# Patient Record
Sex: Female | Born: 1965 | Race: White | Hispanic: No | Marital: Married | Smoking: Never smoker
Health system: Southern US, Community
[De-identification: ages and names within clinical notes are randomized; demographics above are authoritative.]

## PROBLEM LIST (undated history)

## (undated) DIAGNOSIS — R519 Headache, unspecified: Secondary | ICD-10-CM

## (undated) DIAGNOSIS — T7840XA Allergy, unspecified, initial encounter: Secondary | ICD-10-CM

## (undated) DIAGNOSIS — K635 Polyp of colon: Secondary | ICD-10-CM

## (undated) DIAGNOSIS — R112 Nausea with vomiting, unspecified: Secondary | ICD-10-CM

## (undated) DIAGNOSIS — K56609 Unspecified intestinal obstruction, unspecified as to partial versus complete obstruction: Secondary | ICD-10-CM

## (undated) DIAGNOSIS — Z87442 Personal history of urinary calculi: Secondary | ICD-10-CM

## (undated) DIAGNOSIS — E039 Hypothyroidism, unspecified: Secondary | ICD-10-CM

## (undated) DIAGNOSIS — I319 Disease of pericardium, unspecified: Secondary | ICD-10-CM

## (undated) DIAGNOSIS — I472 Ventricular tachycardia: Secondary | ICD-10-CM

## (undated) DIAGNOSIS — E559 Vitamin D deficiency, unspecified: Secondary | ICD-10-CM

## (undated) DIAGNOSIS — R7303 Prediabetes: Secondary | ICD-10-CM

## (undated) DIAGNOSIS — N200 Calculus of kidney: Secondary | ICD-10-CM

## (undated) DIAGNOSIS — Z9889 Other specified postprocedural states: Secondary | ICD-10-CM

## (undated) DIAGNOSIS — T4145XA Adverse effect of unspecified anesthetic, initial encounter: Secondary | ICD-10-CM

## (undated) DIAGNOSIS — E785 Hyperlipidemia, unspecified: Secondary | ICD-10-CM

## (undated) DIAGNOSIS — Z8669 Personal history of other diseases of the nervous system and sense organs: Secondary | ICD-10-CM

## (undated) DIAGNOSIS — R000 Tachycardia, unspecified: Secondary | ICD-10-CM

## (undated) DIAGNOSIS — K5790 Diverticulosis of intestine, part unspecified, without perforation or abscess without bleeding: Secondary | ICD-10-CM

## (undated) DIAGNOSIS — N329 Bladder disorder, unspecified: Secondary | ICD-10-CM

## (undated) DIAGNOSIS — F32A Depression, unspecified: Secondary | ICD-10-CM

## (undated) DIAGNOSIS — L719 Rosacea, unspecified: Secondary | ICD-10-CM

## (undated) DIAGNOSIS — I5030 Unspecified diastolic (congestive) heart failure: Secondary | ICD-10-CM

## (undated) DIAGNOSIS — F419 Anxiety disorder, unspecified: Secondary | ICD-10-CM

## (undated) DIAGNOSIS — K529 Noninfective gastroenteritis and colitis, unspecified: Secondary | ICD-10-CM

## (undated) DIAGNOSIS — J189 Pneumonia, unspecified organism: Secondary | ICD-10-CM

## (undated) DIAGNOSIS — T8859XA Other complications of anesthesia, initial encounter: Secondary | ICD-10-CM

## (undated) DIAGNOSIS — Z8659 Personal history of other mental and behavioral disorders: Secondary | ICD-10-CM

## (undated) HISTORY — PX: SHOULDER SURGERY: SHX246

## (undated) HISTORY — PX: COLONOSCOPY: SHX174

## (undated) HISTORY — DX: Polyp of colon: K63.5

## (undated) HISTORY — PX: LAPAROSCOPY: SHX197

## (undated) HISTORY — PX: ACHILLES TENDON SURGERY: SHX542

## (undated) HISTORY — DX: Personal history of other diseases of the nervous system and sense organs: Z86.69

## (undated) HISTORY — PX: UPPER GASTROINTESTINAL ENDOSCOPY: SHX188

## (undated) HISTORY — DX: Vitamin D deficiency, unspecified: E55.9

## (undated) HISTORY — PX: APPENDECTOMY: SHX54

## (undated) HISTORY — DX: Allergy, unspecified, initial encounter: T78.40XA

## (undated) HISTORY — DX: Anxiety disorder, unspecified: F41.9

## (undated) HISTORY — DX: Tachycardia, unspecified: R00.0

## (undated) HISTORY — PX: TOTAL ABDOMINAL HYSTERECTOMY W/ BILATERAL SALPINGOOPHORECTOMY: SHX83

## (undated) HISTORY — DX: Noninfective gastroenteritis and colitis, unspecified: K52.9

## (undated) HISTORY — PX: CHOLECYSTECTOMY: SHX55

## (undated) HISTORY — DX: Personal history of other mental and behavioral disorders: Z86.59

## (undated) HISTORY — DX: Diverticulosis of intestine, part unspecified, without perforation or abscess without bleeding: K57.90

## (undated) HISTORY — DX: Hypothyroidism, unspecified: E03.9

## (undated) HISTORY — DX: Hyperlipidemia, unspecified: E78.5

## (undated) HISTORY — DX: Unspecified diastolic (congestive) heart failure: I50.30

## (undated) HISTORY — DX: Rosacea, unspecified: L71.9

---

## 1998-08-15 ENCOUNTER — Other Ambulatory Visit: Admission: RE | Admit: 1998-08-15 | Discharge: 1998-08-15 | Payer: Self-pay | Admitting: Obstetrics and Gynecology

## 1999-07-02 ENCOUNTER — Encounter: Payer: Self-pay | Admitting: *Deleted

## 1999-07-02 ENCOUNTER — Encounter: Admission: RE | Admit: 1999-07-02 | Discharge: 1999-07-02 | Payer: Self-pay | Admitting: *Deleted

## 1999-08-27 ENCOUNTER — Other Ambulatory Visit: Admission: RE | Admit: 1999-08-27 | Discharge: 1999-08-27 | Payer: Self-pay | Admitting: Obstetrics and Gynecology

## 2000-11-13 ENCOUNTER — Other Ambulatory Visit: Admission: RE | Admit: 2000-11-13 | Discharge: 2000-11-13 | Payer: Self-pay | Admitting: Obstetrics and Gynecology

## 2001-11-17 ENCOUNTER — Emergency Department (HOSPITAL_COMMUNITY): Admission: EM | Admit: 2001-11-17 | Discharge: 2001-11-17 | Payer: Self-pay | Admitting: Emergency Medicine

## 2001-11-18 ENCOUNTER — Encounter: Admission: RE | Admit: 2001-11-18 | Discharge: 2001-11-18 | Payer: Self-pay

## 2001-11-18 ENCOUNTER — Inpatient Hospital Stay (HOSPITAL_COMMUNITY): Admission: AD | Admit: 2001-11-18 | Discharge: 2001-11-21 | Payer: Self-pay | Admitting: Gastroenterology

## 2001-12-07 ENCOUNTER — Other Ambulatory Visit: Admission: RE | Admit: 2001-12-07 | Discharge: 2001-12-07 | Payer: Self-pay | Admitting: Obstetrics & Gynecology

## 2001-12-28 ENCOUNTER — Encounter: Payer: Self-pay | Admitting: Gastroenterology

## 2001-12-28 ENCOUNTER — Encounter: Admission: RE | Admit: 2001-12-28 | Discharge: 2001-12-28 | Payer: Self-pay | Admitting: Gastroenterology

## 2002-04-22 ENCOUNTER — Encounter (INDEPENDENT_AMBULATORY_CARE_PROVIDER_SITE_OTHER): Payer: Self-pay

## 2002-04-22 ENCOUNTER — Ambulatory Visit (HOSPITAL_COMMUNITY): Admission: RE | Admit: 2002-04-22 | Discharge: 2002-04-22 | Payer: Self-pay | Admitting: Obstetrics and Gynecology

## 2002-04-29 ENCOUNTER — Encounter: Payer: Self-pay | Admitting: Obstetrics and Gynecology

## 2002-04-29 ENCOUNTER — Ambulatory Visit (HOSPITAL_COMMUNITY): Admission: RE | Admit: 2002-04-29 | Discharge: 2002-04-29 | Payer: Self-pay | Admitting: Obstetrics and Gynecology

## 2002-12-13 ENCOUNTER — Other Ambulatory Visit: Admission: RE | Admit: 2002-12-13 | Discharge: 2002-12-13 | Payer: Self-pay | Admitting: Obstetrics and Gynecology

## 2003-01-31 ENCOUNTER — Inpatient Hospital Stay (HOSPITAL_COMMUNITY): Admission: RE | Admit: 2003-01-31 | Discharge: 2003-02-02 | Payer: Self-pay | Admitting: Obstetrics and Gynecology

## 2003-01-31 ENCOUNTER — Encounter (INDEPENDENT_AMBULATORY_CARE_PROVIDER_SITE_OTHER): Payer: Self-pay | Admitting: Specialist

## 2003-02-04 HISTORY — PX: TOTAL ABDOMINAL HYSTERECTOMY W/ BILATERAL SALPINGOOPHORECTOMY: SHX83

## 2003-03-17 ENCOUNTER — Encounter: Admission: RE | Admit: 2003-03-17 | Discharge: 2003-03-17 | Payer: Self-pay | Admitting: Obstetrics and Gynecology

## 2003-06-22 ENCOUNTER — Encounter: Admission: RE | Admit: 2003-06-22 | Discharge: 2003-06-22 | Payer: Self-pay | Admitting: Family Medicine

## 2003-08-11 ENCOUNTER — Observation Stay (HOSPITAL_COMMUNITY): Admission: EM | Admit: 2003-08-11 | Discharge: 2003-08-12 | Payer: Self-pay | Admitting: Emergency Medicine

## 2003-08-16 ENCOUNTER — Inpatient Hospital Stay (HOSPITAL_COMMUNITY): Admission: AD | Admit: 2003-08-16 | Discharge: 2003-08-22 | Payer: Self-pay | Admitting: Internal Medicine

## 2003-10-25 ENCOUNTER — Ambulatory Visit (HOSPITAL_COMMUNITY): Admission: RE | Admit: 2003-10-25 | Discharge: 2003-10-25 | Payer: Self-pay | Admitting: Internal Medicine

## 2003-10-31 ENCOUNTER — Observation Stay (HOSPITAL_COMMUNITY): Admission: AD | Admit: 2003-10-31 | Discharge: 2003-11-01 | Payer: Self-pay | Admitting: General Surgery

## 2003-10-31 ENCOUNTER — Encounter (INDEPENDENT_AMBULATORY_CARE_PROVIDER_SITE_OTHER): Payer: Self-pay | Admitting: Specialist

## 2003-10-31 ENCOUNTER — Encounter: Payer: Self-pay | Admitting: Internal Medicine

## 2004-01-19 ENCOUNTER — Emergency Department (HOSPITAL_COMMUNITY): Admission: EM | Admit: 2004-01-19 | Discharge: 2004-01-20 | Payer: Self-pay | Admitting: Emergency Medicine

## 2004-02-02 ENCOUNTER — Other Ambulatory Visit: Admission: RE | Admit: 2004-02-02 | Discharge: 2004-02-02 | Payer: Self-pay | Admitting: Obstetrics and Gynecology

## 2004-02-04 DIAGNOSIS — K529 Noninfective gastroenteritis and colitis, unspecified: Secondary | ICD-10-CM

## 2004-02-04 HISTORY — DX: Noninfective gastroenteritis and colitis, unspecified: K52.9

## 2004-02-05 ENCOUNTER — Inpatient Hospital Stay (HOSPITAL_COMMUNITY): Admission: RE | Admit: 2004-02-05 | Discharge: 2004-02-07 | Payer: Self-pay | Admitting: Psychiatry

## 2004-02-05 ENCOUNTER — Emergency Department (HOSPITAL_COMMUNITY): Admission: EM | Admit: 2004-02-05 | Discharge: 2004-02-05 | Payer: Self-pay | Admitting: Emergency Medicine

## 2004-02-05 ENCOUNTER — Ambulatory Visit: Payer: Self-pay | Admitting: Psychiatry

## 2004-06-02 ENCOUNTER — Emergency Department (HOSPITAL_COMMUNITY): Admission: EM | Admit: 2004-06-02 | Discharge: 2004-06-02 | Payer: Self-pay | Admitting: Emergency Medicine

## 2004-10-03 ENCOUNTER — Encounter: Admission: RE | Admit: 2004-10-03 | Discharge: 2004-10-03 | Payer: Self-pay | Admitting: Obstetrics and Gynecology

## 2004-10-24 ENCOUNTER — Encounter: Admission: RE | Admit: 2004-10-24 | Discharge: 2004-10-24 | Payer: Self-pay | Admitting: Family Medicine

## 2005-01-29 ENCOUNTER — Encounter: Admission: RE | Admit: 2005-01-29 | Discharge: 2005-01-29 | Payer: Self-pay | Admitting: Family Medicine

## 2005-10-15 ENCOUNTER — Encounter: Admission: RE | Admit: 2005-10-15 | Discharge: 2005-10-15 | Payer: Self-pay | Admitting: Internal Medicine

## 2006-02-24 ENCOUNTER — Encounter: Admission: RE | Admit: 2006-02-24 | Discharge: 2006-02-24 | Payer: Self-pay | Admitting: Surgery

## 2007-04-01 ENCOUNTER — Encounter: Admission: RE | Admit: 2007-04-01 | Discharge: 2007-04-01 | Payer: Self-pay | Admitting: Internal Medicine

## 2007-04-18 ENCOUNTER — Encounter: Admission: RE | Admit: 2007-04-18 | Discharge: 2007-04-18 | Payer: Self-pay | Admitting: Internal Medicine

## 2007-07-01 ENCOUNTER — Ambulatory Visit: Payer: Self-pay | Admitting: Nurse Practitioner

## 2008-02-04 HISTORY — PX: SHOULDER SURGERY: SHX246

## 2008-04-03 ENCOUNTER — Encounter: Admission: RE | Admit: 2008-04-03 | Discharge: 2008-04-03 | Payer: Self-pay | Admitting: Internal Medicine

## 2008-04-17 ENCOUNTER — Encounter: Admission: RE | Admit: 2008-04-17 | Discharge: 2008-04-17 | Payer: Self-pay | Admitting: Internal Medicine

## 2008-04-24 ENCOUNTER — Encounter: Admission: RE | Admit: 2008-04-24 | Discharge: 2008-04-24 | Payer: Self-pay | Admitting: Internal Medicine

## 2008-10-24 ENCOUNTER — Ambulatory Visit: Payer: Self-pay | Admitting: Diagnostic Radiology

## 2008-10-24 ENCOUNTER — Encounter: Payer: Self-pay | Admitting: Emergency Medicine

## 2008-10-25 ENCOUNTER — Inpatient Hospital Stay (HOSPITAL_COMMUNITY): Admission: EM | Admit: 2008-10-25 | Discharge: 2008-10-26 | Payer: Self-pay | Admitting: Internal Medicine

## 2008-11-15 ENCOUNTER — Inpatient Hospital Stay (HOSPITAL_BASED_OUTPATIENT_CLINIC_OR_DEPARTMENT_OTHER): Admission: RE | Admit: 2008-11-15 | Discharge: 2008-11-15 | Payer: Self-pay | Admitting: Cardiovascular Disease

## 2008-11-15 HISTORY — PX: CARDIAC CATHETERIZATION: SHX172

## 2009-01-02 ENCOUNTER — Ambulatory Visit: Payer: Self-pay | Admitting: Internal Medicine

## 2009-01-02 ENCOUNTER — Encounter (INDEPENDENT_AMBULATORY_CARE_PROVIDER_SITE_OTHER): Payer: Self-pay | Admitting: *Deleted

## 2009-01-02 ENCOUNTER — Ambulatory Visit (HOSPITAL_COMMUNITY): Admission: RE | Admit: 2009-01-02 | Discharge: 2009-01-02 | Payer: Self-pay | Admitting: Cardiovascular Disease

## 2009-02-03 HISTORY — PX: POLYPECTOMY: SHX149

## 2009-04-13 ENCOUNTER — Encounter: Admission: RE | Admit: 2009-04-13 | Discharge: 2009-04-13 | Payer: Self-pay | Admitting: Internal Medicine

## 2009-05-01 LAB — TSH: TSH: 0.2 u[IU]/mL — AB (ref <=5.90)

## 2010-02-24 ENCOUNTER — Encounter: Payer: Self-pay | Admitting: Gastroenterology

## 2010-02-24 ENCOUNTER — Encounter: Payer: Self-pay | Admitting: Internal Medicine

## 2010-02-24 ENCOUNTER — Encounter: Payer: Self-pay | Admitting: Surgery

## 2010-03-19 ENCOUNTER — Other Ambulatory Visit: Payer: Self-pay | Admitting: Internal Medicine

## 2010-03-19 DIAGNOSIS — Z1231 Encounter for screening mammogram for malignant neoplasm of breast: Secondary | ICD-10-CM

## 2010-04-10 ENCOUNTER — Encounter: Payer: Self-pay | Admitting: Cardiovascular Disease

## 2010-04-10 DIAGNOSIS — I5030 Unspecified diastolic (congestive) heart failure: Secondary | ICD-10-CM | POA: Insufficient documentation

## 2010-04-10 DIAGNOSIS — E785 Hyperlipidemia, unspecified: Secondary | ICD-10-CM | POA: Insufficient documentation

## 2010-04-10 DIAGNOSIS — Z8659 Personal history of other mental and behavioral disorders: Secondary | ICD-10-CM | POA: Insufficient documentation

## 2010-04-10 DIAGNOSIS — F419 Anxiety disorder, unspecified: Secondary | ICD-10-CM | POA: Insufficient documentation

## 2010-04-10 DIAGNOSIS — L719 Rosacea, unspecified: Secondary | ICD-10-CM | POA: Insufficient documentation

## 2010-04-10 DIAGNOSIS — E039 Hypothyroidism, unspecified: Secondary | ICD-10-CM | POA: Insufficient documentation

## 2010-04-10 DIAGNOSIS — Z8669 Personal history of other diseases of the nervous system and sense organs: Secondary | ICD-10-CM | POA: Insufficient documentation

## 2010-04-26 ENCOUNTER — Ambulatory Visit: Payer: Self-pay

## 2010-05-03 ENCOUNTER — Encounter: Payer: Self-pay | Admitting: Internal Medicine

## 2010-05-03 ENCOUNTER — Encounter: Payer: Self-pay | Admitting: Cardiovascular Disease

## 2010-05-03 ENCOUNTER — Ambulatory Visit (INDEPENDENT_AMBULATORY_CARE_PROVIDER_SITE_OTHER): Payer: Commercial Managed Care - PPO | Admitting: Cardiovascular Disease

## 2010-05-03 ENCOUNTER — Ambulatory Visit
Admission: RE | Admit: 2010-05-03 | Discharge: 2010-05-03 | Disposition: A | Discharge status: Home / Self Care | Payer: Commercial Managed Care - PPO | Source: Ambulatory Visit | Attending: Internal Medicine | Admitting: Internal Medicine

## 2010-05-03 VITALS — BP 120/80 | HR 88 | Wt 234.0 lb

## 2010-05-03 DIAGNOSIS — Z1231 Encounter for screening mammogram for malignant neoplasm of breast: Secondary | ICD-10-CM

## 2010-05-03 DIAGNOSIS — E669 Obesity, unspecified: Secondary | ICD-10-CM

## 2010-05-03 DIAGNOSIS — I10 Essential (primary) hypertension: Secondary | ICD-10-CM

## 2010-05-03 DIAGNOSIS — I309 Acute pericarditis, unspecified: Secondary | ICD-10-CM

## 2010-05-03 MED ORDER — DILTIAZEM HCL ER COATED BEADS 120 MG PO CP24: 120.0000 mg | ORAL_CAPSULE | Freq: Every day | ORAL | Status: DC

## 2010-05-03 NOTE — Progress Notes (Signed)
History of Present Illness:  Cynthia [name] is a middle-aged female with the history of pericardial like pleuritic chest pain, diastolic congestive heart failure, hyperlipidemia, obesity, and hypertension. She also has a history of hypothyroidism is on Synthroid replacement. She has had significant pleuritic chest pain for the past several weeks. She's been on Indocin and it seems to be helping. She injured her left shoulder and he stepped surgery in the next week or so.  In the past we have performed a heart catheterization on her which revealed smooth and normal coronary arteries. She has had a cardiopulmonary stress test which revealed no significant cardiac abnormalities. Current Outpatient Prescriptions on File Prior to Visit  Medication Sig Dispense Refill  . atorvastatin (LIPITOR) 20 MG tablet Take 20 mg by mouth daily.        . Est Estrogens-Methyltest (ESTRATEST PO) Take by mouth daily.        . LamoTRIgine (LAMICTAL XR) 300 MG TB24 Take by mouth daily.        Marland Kitchen levothyroxine (SYNTHROID, LEVOTHROID) 75 MCG tablet Take 75 mcg by mouth daily.        . metoprolol (LOPRESSOR) 100 MG tablet Take 150 mg by mouth 2 (two) times daily.        . QUEtiapine (SEROQUEL) 25 MG tablet Take 25 mg by mouth at bedtime.        Marland Kitchen DISCONTD: niacin (NIASPAN) 500 MG CR tablet Take 500 mg by mouth at bedtime.        Marland Kitchen DISCONTD: Sulfamethoxazole-Trimethoprim (SEPTRA DS PO) Take by mouth daily.          Allergies  Allergen Reactions  . Ampicillin     SERUM SICKNESS REACTION    Past Medical History  Diagnosis Date  . Diastolic congestive heart failure   . Hyperlipemia   . Hypothyroidism   . History of depression   . History of migraines   . Rosacea   . Anxiety     Past Surgical History  Procedure Date  . Total abdominal hysterectomy w/ bilateral salpingoophorectomy   . Cholecystectomy   . Cardiac catheterization 11/15/08    SMOOTH AND NORMAL    History  Smoking status  . Never Smoker   Smokeless  tobacco  . Not on file    History  Alcohol Use No    Family History  Problem Relation Age of Onset  . Adopted: Yes    Reviw of Systems:  The patient denies any heat or cold intolerance.  No weight gain or weight loss.  The patient denies headaches or blurry vision.  There is no cough or sputum production.  The patient denies dizziness.  There is no hematuria or hematochezia.  The patient denies any muscle aches or arthritis.  The patient denies any rash.  The patient denies frequent falling or instability.  There is no history of depression or anxiety.  All other systems were reviewed and are negative.  Physical Exam: BP 120/80  Pulse 88  Wt 234 lb (106.142 kg) The patient is alert and oriented x 3.  The mood and affect are normal.  The skin is warm and dry.  Color is normal.  The HEENT exam reveals that the sclera are nonicteric.  The mucous membranes are moist.  The carotids are 2+ without bruits.  There is no thyromegaly.  There is no JVD.  The lungs are clear.  The chest wall is non tender.  The heart exam reveals a regular rate with a normal S1  and S2.  There are no murmurs, gallops, or rubs.  The PMI is not displaced.   Abdominal exam reveals good bowel sounds.  There is no guarding or rebound.  There is no hepatosplenomegaly or tenderness.  There are no masses.  Exam of the legs reveal no clubbing, cyanosis, or edema.  The legs are without rashes.  The distal pulses are intact.  Cranial nerves II - XII are intact.  Motor and sensory functions are intact.  The gait is normal.  ECG: From her office reveals normal sinus rhythm. She has no ST segment depression and no ST segment elevation. Assessment / Plan:

## 2010-05-03 NOTE — Assessment & Plan Note (Signed)
Cynthia [name]  presents with persistent episodes of Pleuritic chest pain. She had another CT angiogram of her chest and has no evidence of pulmonary embolus. I do not have a good explanation of why she keeps having pleuritic chest pain. Her echocardiograms have not revealed any evidence of pericardial thickening and she does not have any other hemodynamic signs or symptoms consistent with a chronic pericarditis.She is to continue with the Indocin. I'll see her back in 6 months.

## 2010-05-03 NOTE — Assessment & Plan Note (Signed)
I've asked her to stick to a good diet and exercise program. I've asked her to try eating a lower carbohydrate diet and include more protein. She may need to see a nutritionist. Also asked her to exercise on a regular basis.

## 2010-05-09 LAB — POCT I-STAT 3, VENOUS BLOOD GAS (G3P V)
Acid-base deficit: 1 mmol/L (ref 0.0–2.0)
Bicarbonate: 24.7 mEq/L — ABNORMAL HIGH (ref 20.0–24.0)
O2 Saturation: 74 %
pO2, Ven: 42 mmHg (ref 30.0–45.0)

## 2010-05-09 LAB — POCT I-STAT 3, ART BLOOD GAS (G3+)
Bicarbonate: 23.8 mEq/L (ref 20.0–24.0)
pCO2 arterial: 40 mmHg (ref 35.0–45.0)
pH, Arterial: 7.383 (ref 7.350–7.400)
pO2, Arterial: 77 mmHg — ABNORMAL LOW (ref 80.0–100.0)

## 2010-05-10 LAB — DIFFERENTIAL
Basophils Absolute: 0.1 10*3/uL (ref 0.0–0.1)
Lymphocytes Relative: 22 % (ref 12–46)
Monocytes Absolute: 0.4 10*3/uL (ref 0.1–1.0)
Monocytes Relative: 5 % (ref 3–12)
Neutro Abs: 5 10*3/uL (ref 1.7–7.7)

## 2010-05-10 LAB — BASIC METABOLIC PANEL
Calcium: 9.6 mg/dL (ref 8.4–10.5)
GFR calc Af Amer: >60 mL/min (ref >60)
GFR calc non Af Amer: >60 mL/min (ref >60)
Sodium: 143 mEq/L (ref 135–145)

## 2010-05-10 LAB — POCT CARDIAC MARKERS
CKMB, poc: <1 ng/mL — ABNORMAL LOW (ref 1.0–8.0)
Myoglobin, poc: 41.2 ng/mL (ref 12–200)
Troponin i, poc: <0.05 ng/mL (ref 0.00–0.09)

## 2010-05-10 LAB — COMPREHENSIVE METABOLIC PANEL
ALT: 29 U/L (ref 0–35)
Alkaline Phosphatase: 92 U/L (ref 39–117)
CO2: 26 mEq/L (ref 19–32)
Calcium: 8.8 mg/dL (ref 8.4–10.5)
GFR calc non Af Amer: >60 mL/min (ref >60)
Glucose, Bld: 109 mg/dL — ABNORMAL HIGH (ref 70–99)
Sodium: 138 mEq/L (ref 135–145)
Total Bilirubin: 0.1 mg/dL — ABNORMAL LOW (ref 0.3–1.2)

## 2010-05-10 LAB — CARDIAC PANEL(CRET KIN+CKTOT+MB+TROPI)
CK, MB: 0.6 ng/mL (ref 0.3–4.0)
Total CK: 79 U/L (ref 7–177)
Total CK: 84 U/L (ref 7–177)
Total CK: 86 U/L (ref 7–177)

## 2010-05-10 LAB — CBC
Hemoglobin: 12.5 g/dL (ref 12.0–15.0)
Hemoglobin: 13.5 g/dL (ref 12.0–15.0)
MCHC: 34.5 g/dL (ref 30.0–36.0)
RBC: 3.86 MIL/uL — ABNORMAL LOW (ref 3.87–5.11)
RBC: 4.13 MIL/uL (ref 3.87–5.11)
WBC: 7.2 10*3/uL (ref 4.0–10.5)

## 2010-05-10 LAB — SEDIMENTATION RATE: Sed Rate: 15 mm/hr (ref 0–22)

## 2010-05-10 LAB — TSH: TSH: 2.96 u[IU]/mL (ref 0.350–4.500)

## 2010-05-27 ENCOUNTER — Other Ambulatory Visit: Payer: Self-pay | Admitting: *Deleted

## 2010-05-27 ENCOUNTER — Telehealth: Payer: Self-pay | Admitting: *Deleted

## 2010-05-27 DIAGNOSIS — I10 Essential (primary) hypertension: Secondary | ICD-10-CM

## 2010-05-27 DIAGNOSIS — E785 Hyperlipidemia, unspecified: Secondary | ICD-10-CM

## 2010-05-27 MED ORDER — METOPROLOL TARTRATE 100 MG PO TABS: 150.0000 mg | ORAL_TABLET | Freq: Two times a day (BID) | ORAL | Status: DC

## 2010-05-27 MED ORDER — ATORVASTATIN CALCIUM 40 MG PO TABS: 40.0000 mg | ORAL_TABLET | Freq: Every day | ORAL | Status: DC

## 2010-05-27 NOTE — Telephone Encounter (Signed)
Fax received from pharmacy. Refill completed. Jodette Kerstie Agent RN  

## 2010-05-27 NOTE — Telephone Encounter (Signed)
Pt phoned, new med strength called in and pt told to increase exercise  And follow up with lipids a cmet in 3 months. Pt stated she can get it done for free where she works and would do. Alfonso Ramus RN

## 2010-05-31 ENCOUNTER — Other Ambulatory Visit: Payer: Self-pay | Admitting: *Deleted

## 2010-05-31 DIAGNOSIS — I10 Essential (primary) hypertension: Secondary | ICD-10-CM

## 2010-05-31 MED ORDER — METOPROLOL TARTRATE 100 MG PO TABS: 150.0000 mg | ORAL_TABLET | Freq: Two times a day (BID) | ORAL | Status: DC

## 2010-05-31 NOTE — Telephone Encounter (Signed)
Wants copy of beta tape echo from 2010, i called [facility] and they dont have i am turning over to our med records to see if they can research.Alfonso Ramus RN

## 2010-05-31 NOTE — Telephone Encounter (Signed)
escribe medication per fax request  

## 2010-06-03 ENCOUNTER — Encounter: Payer: Self-pay | Admitting: Cardiovascular Disease

## 2010-06-10 ENCOUNTER — Telehealth: Payer: Self-pay | Admitting: Cardiovascular Disease

## 2010-06-10 NOTE — Telephone Encounter (Signed)
Fax: [phone] latest OV, EKG, and ECHO

## 2010-06-18 NOTE — Assessment & Plan Note (Signed)
NAMESEVERINA, Cynthia [name]                  ACCOUNT NO.:  [id number]   MEDICAL RECORD NO.:  [id number]          PATIENT TYPE:  POB   LOCATION:  [facility] at [facility]         FACILITY:  [facility]   PHYSICIAN:  Elsie Lincoln, MD      DATE OF BIRTH:  [birth year]   DATE OF SERVICE:  07/01/2007                                  CLINIC NOTE   The patient comes to office today as a new patient for consultation on  her headaches.  She is a well-known patient to me from [facility].  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  She does have  a longstanding history of migraine without aura for the past 20+ years.  Her headaches are generally in her right temple that can move to her  left temple, and she has all the associated symptoms.  She actually has  a very few number of headaches.  She rarely has a severe headache.  She  has an occasional moderate headache and no mild.  When she does have a  severe headache, they have a tendency to last for several days, and she  has a tendency to have a very rapid onset of her headaches.  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  She does have pretty extensive nausea when she gets the  headache and is requesting a prescription for Phenergan as well as her  Reglan.  She has been on Lopressor for several years for headache  prevention and that does seem to be working very well for her.  Her main  objective today is to get her medicines refilled.   PHYSICAL EXAMINATION:  Well developed and well nourished 45 year old  Caucasian female in no acute distress.  VITAL SIGNS:  Blood pressure is 113/68, pulse is 65, and weight is 219.  HEENT:  Head is normocephalic and atraumatic.  Pupils are equal and  reactive.  CARDIAC:  Regular rate and rhythm.  No murmurs, rubs, or thrills.  LUNGS:  Clear bilaterally without rales, rhonchi, or wheezes.  NEUROLOGIC:  The patient is alert, oriented, and well coordinated.  She  has  no difficulty with movement, muscle tone, or coordination.  Her  affect is appropriate.  She is fluid in her speech.   ASSESSMENT AND PLAN:  Migraine headache without aura.  We will refill  her Imitrex 100 mg 1 p.o. p.r.n. migraine.  We will also refill her  Phenergan as well as her Reglan and Lopressor.  The Lopressor is 50 mg 1  p.o. daily.  She is also requesting a prescription for Toradol 10 mg for  pain as needed.  She will follow up on a yearly basis or sooner, if she  needs her medications refilled.       Cynthia Richter, NP    ______________________________  Elsie Lincoln, MD    LR/MEDQ  D:  07/01/2007  T:  07/02/2007  Job:  [id number]

## 2010-06-21 NOTE — H&P (Signed)
Cynthia [name], Cynthia [name]                              ACCOUNT NO.:  [id number]   MEDICAL RECORD NO.:  [id number]                   PATIENT TYPE:  INP   LOCATION:                                       FACILITY:  [facility]   PHYSICIAN:  Burnice Logan, M.D.               DATE OF BIRTH:  28-Jun-1965   DATE OF ADMISSION:  08/11/2003  DATE OF DISCHARGE:  08/12/2003                                HISTORY & PHYSICAL   CHIEF COMPLAINT:  Numbness, paresthesias, and weakness of upper extremities.   HISTORY OF PRESENT ILLNESS:  The patient is a pleasant 45 year old Caucasian  lady who experienced numbness, this appeared to involve her elbows and upper  neck subsequently.  She now complains of weakness, as well, and could not  hold adducted later today.  She found it very difficult working today.  She  has had problems involving her feet and was evaluated with an MRI in May  2005.  The MRI exam was negative for multiple sclerosis among other  diagnoses.  She is very concerned that her symptoms will not be taken  seriously today as she has come across many patients who have had vague  symptoms like she presents with and have been found to be malingering.  She  denies having had any neck or back injuries.  She has had no recent falls or  trauma.  She had a CT scan done of her brain in the emergency room  which  was negative.  Her paresthesias started in her feet 2-3 months ago.  This  was initially attributed to Depakote which she had been taking for her mood  disorder.  The Depakote was discontinued six weeks ago.  She was started on  Lamictal in addition to her Zoloft.  This  has not made any difference to  her symptoms and she still continues to have paresthesias in both feet.  She  sometimes has severe pain in her feet and finds walking difficult.  She,  however, states that since she started Lamictal, she has had good control of  her mood disorder.   PAST MEDICAL HISTORY:  Hypothyroidism diagnosed  recently.  Her TSH three  months ago when she developed paresthesias was 13.  She was put on Synthroid  and had a recent TSH check which was normal.  Depression.  Hysterectomy for  endometriosis and uncontrolled vaginal bleeding in December 2004.  Paresthesias in the feet and hands.  History is negative for hypertension,  diabetes, or other chronic medical problems.   ALLERGIES:  No known drug allergies.   CURRENT MEDICATIONS:  Synthroid 100 mcg, Zoloft 200 mg, Lamictal 100 mg,  Ambien 10 mg, Klonopin 1 mg p.r.n., Prempro 0.3/1.5 mg daily.   FAMILY HISTORY:  The patient is an adopted child.   SOCIAL HISTORY:  The patient is married and never had kids.  She adopted a  child who is currently 51 months old.  She works as a Management consultant Cardiology.  She does not smoke and does not drink alcohol.   REVIEW OF SYMPTOMS:  GENERAL:  Negative fever or chills.  Has had probably a  slight weight loss in the last few days and her appetite has diminished, as  well.  NEUROLOGICAL:  Please refer to history of present illness.  She has  had some occipital headaches associated with her neck pain.  She denies any  visual problems.  She just had have a recent eye check and her vision was  20/20.  She has never had any seizure activity.  CARDIORESPIRATORY:  No  shortness of breath, no chest pain.  GASTROINTESTINAL:  Normal bowel habits,  no abdominal pain.  GENITOURINARY:  No urinary incontinence, no dysuria.  MUSCULOSKELETAL:  No arthralgias.  INTEGUMENTARY:  No skin rashes or  nodules.  PSYCHIATRIC:  History of depression which is well controlled.  All  other systems negative.   PHYSICAL EXAMINATION:  GENERAL:  Young Caucasian lady, average size, in no acute distress.  VITAL SIGNS:  Temperature 98.3, blood pressure 119/77, heart rate 94,  respiratory rate 15.  HEENT:  Normocephalic, no facial asymmetry.  Pupils are round and symmetric.  Normal extraocular eye movements.  No  nystagmus.  Conjunctivae pink.  Mucous  membranes moist.  NECK:  Supple, no lymph nodes, no masses felt.  LUNGS:  Adequate bilateral air movement, clear to auscultation, unlabored.  CARDIOVASCULAR:  Heart sounds 1 and 2 heard regular, no murmurs.  Abdomen is  obese, soft, nontender.  No masses felt.  Normal bowel sounds.  CNS:  Alert and oriented x 3.  Cranial nerves 2-12 grossly intact.  She has  good hand grips bilaterally and I do not detect any motor deficits on exam.  She is able to lift both legs off the floor and straight leg raise is  greater than 60 bilaterally.  Her gait is normal.  Deep tendon reflexes are  brisk in both knees as well as both elbows.  Sensation is grossly intact  with no deficits elicited.  BACK:  No deformity of the spine is seen.  No tenderness on palpation.  EXTREMITIES:  No pedal edema.   LABORATORY DATA:  White count 5.3, hemoglobin 10.8, hematocrit 31, MCV 89.  Glucose 93, sodium 137, potassium 3.7, chloride 108, CO2 27, BUN 13,  creatinine 0.6.   ASSESSMENT AND PLAN:  1. Neck pain with upper extremity weakness.  Clinical exam is negative for     any meningeal signs.  The patient is afebrile.  I will admit her to get a     C-spine MRI in the morning as well as neurological consultation.  Will     also check a thyroid profile to make sure that she is on the right dosage     and get a B12 level as well as iron studies.  Differential diagnoses     include subacute combined degeneration of the cord as well as myopathy of     unknown etiology.  2. Hypothyroidism.  Will continue her usual thyroid supplement while     awaiting TSH level.  3. Depression that is adequately controlled on her current regimen.  Burnice Logan, M.D.    ES/MEDQ  D:  08/11/2003  T:  08/11/2003  Job:  [id number]   cc:   Talmadge Coventry, M.D.  [address]  [place]  [place] [address]  Fax: [phone]

## 2010-06-21 NOTE — Op Note (Signed)
NAME:  VIERA, OKONSKI                            ACCOUNT NO.:  [id number]   MEDICAL RECORD NO.:  [id number]                   PATIENT TYPE:  INP   LOCATION:  9311                                 FACILITY:  [facility]   PHYSICIAN:  Carrington Clamp, M.D.              DATE OF BIRTH:  01-28-1966   DATE OF PROCEDURE:  01/31/2003  DATE OF DISCHARGE:                                 OPERATIVE REPORT   PREOPERATIVE DIAGNOSIS:  1. Endometriosis.  2. Metrorrhagia.  3. Menorrhagia.  4. Dysmenorrhea.   POSTOPERATIVE DIAGNOSIS:  1. Endometriosis.  2. Metrorrhagia.  3. Menorrhagia.  4. Dysmenorrhea.   PROCEDURE:  Total abdominal hysterectomy  and bilateral salpingo-  oophorectomy.   SURGEON:  Carrington Clamp, M.D.   ANESTHESIA:  General endotracheal anesthesia.   ESTIMATED BLOOD LOSS:  75 mL.   FLUIDS REPLACED:  1500 mL.   URINE OUTPUT:  100 mL.   FINDINGS:  Small boggy uterus with endometriosis on the left ovary.  The  right ovary appeared normal.  The appendix, cul-de-sac, and rest of the  pelvis appeared free of endometriosis.  There were normal tubes, otherwise.   COMPLICATIONS:  None.   MEDICATIONS:  Ampicillin preoperatively for mitral valve prolapse.   PATHOLOGY:  Uterus, cervix, ovaries, and tubes.   COUNTS:  Correct x 3.   SURGICAL TECHNIQUE:  After adequate general anesthesia was achieved, the  patient was prepped and draped in the usual sterile fashion in the dorsal  supine position.  A Foley catheter had been placed.  A Pfannenstiel skin  incision was made with the scalpel and carried down to the fascia with the  Bovie cautery.  The fascia was incised in the midline and carried in a  transverse curving manner with the Mayo scissors.  The fascia was reflected  superiorly and inferiorly from the rectus muscles.  A bowel free portion of  peritoneum was entered into bluntly and then incised inferiorly and  superiorly with Metzenbaum scissors.  The bowel was packed away with  three  wet laps and the O'Connor-O'Sullivan retractor placed.   The uterus was grasped with a pair of Kelly clamps and the round ligaments  were secured with a suture transfixion stitch of 0 Vicryl.  Each pedicle was  then divided with Bovie cautery and the vesicouterine fascia was incised  with the Bovie cautery, as well.  This aided in creating the bladder flap.  An avascular portion of the posterior leaf of the broad ligament was then  entered into with Bovie cautery and Heaney clamps placed bilaterally over  each infundibulopelvic ligament.  Each pedicle was incised with Mayo  scissors and secured with a free hand tie of 0 Vicryl followed by a stitch  of 0 Vicryl.  The uterine arteries were skeletonized and Heaneys were placed  bilaterally at the level of the internal os.  Each pedicle was incised with  Mayo scissors  and secured with a stitch of 0 Vicryl.   The cardinal ligament was then divided with alternating suture bites with a  straight Heaney clamp.  Each pedicle was incised with the Mayo scissors and  secured with a stitch of 0 Vicryl.  At the level of the reflection of the  vagina onto the cervix, a pair of Heaneys were placed bilaterally.  Each was  incised with the Jorgenson scissors and then the angle of the cuff and the  uterosacrals secured with a Heaney stitch of 0 Vicryl.  The uterine specimen  was amputated with Jorgenson scissors and then the cuff closed with three  figure-of-eight sutures of 0 Vicryl.  Hemostasis was achieved and irrigation  was performed and all instruments were withdrawn through the abdomen.   The peritoneum was closed with running stitch of 2-0 Vicryl.  The muscle was  inspected and found to be hemostatic.  The fascia was closed with a running  stitch of 0 looped PDS.  The subcutaneous tissue was closed with through and  through stitches of 2-0 plain gut.  The skin was closed with staples.  The  subcutaneous layer had been rendered hemostatic  with the Bovie cautery and  irrigation.  The patient tolerated the procedure well and was returned to  the recovery room in stable condition.                                               Carrington Clamp, M.D.    MH/MEDQ  D:  01/31/2003  T:  01/31/2003  Job:  [id number]

## 2010-06-21 NOTE — H&P (Signed)
NAME:  JAY, KEMPE                            ACCOUNT NO.:  [id number]   MEDICAL RECORD NO.:  [id number]                   PATIENT TYPE:  INP   LOCATION:  5715                                 FACILITY:  [facility]   PHYSICIAN:  James L. Malon Kindle., M.D.          DATE OF BIRTH:  15-Oct-1965   DATE OF ADMISSION:  11/18/2001  DATE OF DISCHARGE:                                HISTORY & PHYSICAL   REASON FOR ADMISSION:  Bloody diarrhea.   HISTORY OF PRESENT ILLNESS:  A nice 45 year old woman who has had no  previous history of inflammatory bowel disease.  She did experience upper  abdominal pain with fairly sudden onset two days ago.  She had to go to the  emergency room due to severe nausea, vomiting, and diarrhea, had 30 stools  or more.  She had a stool about every 15 to 30 minutes for awhile and  subsequently became bloody.  She has had severe cramp.  In addition to this,  she has had a low-grade temperature.  Normally,she has about two to three  bowel movements a week.  Sometimes she has to take something.  Her symptoms  have been much worse over the past 24 hours.  She is still throwing up  nearly everything.  She had to go to the emergency room and was seen there,  had labs done that reported we were okay.  She had an x-ray done showing no  evidence of a bowel obstruction.  She is continuing to throw up, continuing  to feel weak and dizzy, noted that her pulse was 128 earlier today.   CURRENT MEDICATIONS:  Birth control pills.   ALLERGIES:  No known drug allergies   PAST MEDICAL HISTORY:  Pneumonia in 1998.  No previous surgeries.   FAMILY HISTORY:  Unknown as patient is adopted.   SOCIAL HISTORY:  She is married, works as a Engineer, maintenance Cardiology.  She  does not smoke, drinks socially.   REVIEW OF SYSTEMS:  Remarkable for poor appetite, bleeding, nausea and  vomiting, all associated with acute illness.  She does use birth control  pills.  No problems with her menses.   PHYSICAL EXAMINATION:  VITAL SIGNS:  Temperature 99.2, pulse 108, blood  pressure 110/66.  GENERAL:  Very ill-appearing, flushed, young white female.  HEENT:  Eyes: Sclerae nonicteric.  NECK:  Supple.  No lymphadenopathy.  LUNGS:  Clear.  HEART:  Regular rate and rhythm without murmurs or gallops.  ABDOMEN:  Soft with markedly increased bowel sounds, mild diffuse  tenderness.  RECTAL:  [place] stool that is grossly heme positive.    ASSESSMENT:  Bloody diarrhea probably due to bacterial dysentery.  I suspect  this is infectious based on the fact that she has had no chronic symptoms  and this came on so suddenly.  She has already gotten 3 liters of fluid in  the ER just  24 hours ago and still has a pulse of 108.  I think she is  clinically dehydrated.  We will admit, give IV fluids, culture the stool,  and start her on clear liquids.  Hopefully this will resolve within 24 to 48  hours.                                               James L. Malon Kindle., M.D.    Waldron Session  D:  11/18/2001  T:  11/19/2001  Job:  [id number]   cc:   Dr. Elfredia Nevins

## 2010-06-21 NOTE — Discharge Summary (Signed)
   NAME:  Cynthia [name], Cynthia [name]                            ACCOUNT NO.:  [id number]   MEDICAL RECORD NO.:  [id number]                   PATIENT TYPE:  INP   LOCATION:  5715                                 FACILITY:  [facility]   PHYSICIAN:  James L. Malon Kindle., M.D.          DATE OF BIRTH:  Oct 27, 1965   DATE OF ADMISSION:  11/18/2001  DATE OF DISCHARGE:  11/21/2001                                 DISCHARGE SUMMARY   ADMISSION DIAGNOSIS:  Bloody diarrhea felt to be due to bacterial dysentery.   FINAL DIAGNOSIS:  Bloody diarrhea felt to be due to bacterial dysentery;  cultures negative for enteric pathogens.   HOSPITAL COURSE:  The patient was admitted after several days of bloody  diarrhea and low-grade fever and vomiting.  She was well; has had no other  previous problems.  She was admitted for IV fluids, felt much better after  fluids, continued for about 24-48 hours to have nausea, vomiting, and loose  stools, it gradually cleared up and she was able to tolerate a liquid diet  advancing to complex carbohydrates.  At that point in time she was felt to  be doing well enough to go home.  She did have stool cultures done; these  were negative.  Clostridium difficile toxin was negative; white cells were  negative.  It was felt that she had partially treated or recovering  bacterial dysentery or a viral dysentery.   DISPOSITION:  The patient is discharged home on a liquid complex-  carbohydrate diet advancing slowly with instructions to call in the next  several days with a report or if worse.  She is given Phenergan for  symptoms.                                               James L. Malon Kindle., M.D.    Waldron Session  D:  12/09/2001  T:  12/10/2001  Job:  [id number]   cc:   Dr. Elfredia Nevins

## 2010-06-21 NOTE — Consult Note (Signed)
Cynthia Cynthia [name], Cynthia [name] NO.:  [id number]   MEDICAL RECORD NO.:  [id number]          PATIENT TYPE:  EMS   LOCATION:  MAJO                         FACILITY:  [facility]   PHYSICIAN:  Cynthia Cynthia [name], M.D.    DATE OF BIRTH:  1965/09/27   DATE OF CONSULTATION:  06/02/2004  DATE OF DISCHARGE:                                   CONSULTATION   ADMITTING H&P OR ED CONSULT NOTE:   IDENTIFICATION AND CHIEF COMPLAINT:  The patient is a PA on the general  surgery service, 45 year old with abdominal pain and nausea.   The patient was in her normal state of health until about six days ago when  she developed abdominal pain while out of town. She came back home early  because of the pain which began to subside on Tuesday and Wednesday and  Thursday; however, it started to come back again yesterday.  It got very  intense today when she came into the emergency room with abdominal pain in  the right lower quadrant. This is where the pain has been mostly during the  whole time.  Also while she was in the emergency room, she developed very  severe epigastric pain, and we were asked to see her.   PAST MEDICAL HISTORY:  1.  Depression.  2.  Hypothyroidism.   MEDICATIONS:  1.  Lamictal 150 mg b.i.d.  2.  Tegretol 200 mg twice a day.  3.  Synthroid 100 mcg once a day.   PAST SURGICAL HISTORY:  1.  Total abdominal hysterectomy.  2.  Previous laparoscopic cholecystectomy.   ALLERGIES:  No known drug allergies.   REVIEW OF SYSTEMS:  Her last bowel movement was a couple of days ago, and  she rarely goes more than two or three times per week.  She has had no  fevers or chills.  She has had nausea but no vomiting.   PHYSICAL EXAMINATION:  VITAL SIGNS:  Temperature 99.2, blood pressure  114/71, pulse normal.  GENERAL:  She appears to be in moderate to severe acute distress, looks a  bit pasty, very uncomfortable.  HEENT:  Normocephalic and atraumatic and anicteric.  LUNGS:  Clear to  auscultation.  CARDIAC:  Regular rhythm and rate with no murmurs, gallops, rubs, or heaves.  ABDOMEN:  Soft.  She does not have any rebound or guarding, but she is  tender in the right lower quadrant and in the epigastrium which is mild to  moderate.  PELVIC/RECTAL:  She did not get a pelvic or rectal exam.   LABORATORY DATA AND OTHER STUDIES:  On reviewing the lab studies, her white  count is normal at 5800 with no left shift.  Hemoglobin 13.2, platelets  normal.  UA is without evidence of UTI or blood.   Reviewed her CT scan which showed no evidence of acute appendicitis and no  other inflammatory process.   IMPRESSION:  I am unsure of the etiology of Cynthia Cynthia [name]'s abdominal pain in her  right lower quadrant or epigastrium.  Possibly a very early partial small-  bowel obstruction could do  this; however, with no dilated small-bowel loops,  it is hard to make this diagnosis.  Her pain seems to be improving in the  ED, although she has had some Dilaudid, and we are going to try to give her  some clear liquids to see if she can tolerate this and go home.  If not, she  will be admitted for pain control and possible further workup of her  abdominal pain.      JOW/MEDQ  D:  06/02/2004  T:  06/02/2004  Job:  [id number]

## 2010-06-21 NOTE — Consult Note (Signed)
NAME:  Cynthia [name], Cynthia [name] NO.:  [id number]   MEDICAL RECORD NO.:  [id number]                   PATIENT TYPE:  INP   LOCATION:  3022                                 FACILITY:  [facility]   PHYSICIAN:  Genene Churn. Love, M.D.                 DATE OF BIRTH:  [birth year]   DATE OF CONSULTATION:  08/16/2003  DATE OF DISCHARGE:                                   CONSULTATION   REFERRING PHYSICIAN:  Jonna L. Robb Matar, M.D.   REASON FOR CONSULTATION:  This 45 year old, right-handed, white, married  female is seen at the request of Dr. Robb Matar after readmission for  evaluation of pain and burning occurring in her hands and arms associated  with weakness.   HISTORY OF PRESENT ILLNESS:  Cynthia [name] has a history of depression and  possibly Bipolar disorder in the past treated with Zoloft and Depakote, but  in May 2005, she developed numbness in her hands and feet, was evaluated  with an MRI study of the brain as an outpatient and was found to be  hypothyroid and placed on Synthroid replacement.  Because of the unexplained  numbness in her hands and feet, her Depakote was tapered and discontinued  and she was started on titrating doses of Lamictal.  Subsequently, she has  been in good health on Lamictal 100 mg per day, Synthroid 100 mcg per day,  Klonopin 1 mg t.i.d. and Zoloft 200 mg q.d.  She was in her usual state of  health until recently she noted the onset of pain involving her hands and  arms, burning in her hands and arms, weakness in her hands and arms without  associated Lhermitte sign, cranial nerve symptoms, bowel or bladder  dysfunction, fever or chills.  She was admitted to [facility] on  August 11, 2003.  Her examination at that time was normal.  MRI study of the  brain showed a single, white matter lesion high in the right frontal white  matter.  MRI study of the cervical spine with and without contrast  enhancement showed no definite abnormality.  CBC  was normal except for a  hemoglobin of 10.8, hematocrit 31.2, white blood cell count was 5300 and  platelet count was 229,000.  Her sedimentation rate was 11 with a normal  comprehensive metabolic panel with CK, CK-MB, repeat TSH, T4 and T3.  Her  iron was 91 with B12 of 512.  Her urinalysis was unremarkable and other  studies were to be considered as an outpatient.  She subsequently has had  recurring pain at home and noticed increased weakness in her hands and arms  without other focal abnormalities.  She has complained of a muscle soreness  and joint soreness without associated redness, crepitation or swelling.   She has had no nausea, vomiting, chills, fever, bowel or bladder symptoms,  etc.  She denies any tick exposure.   MEDICATIONS:  1. MS Contin 15 mg b.i.d.  2. Oxycodone 5 mg b.i.d.  3. Zofran 8 mg t.i.d.  4. Zoloft 200 mg q.d.  5. Synthroid 100 mcg q.d.  6. Klonopin 1 mg t.i.d.   PHYSICAL EXAMINATION:  GENERAL:  Well-developed, well-nourished, pleasant,  white female.  VITAL SIGNS:  Blood pressure in right and left arm of 120/80, 110/80, heart  rate 116, afebrile, respirations 18.  NEUROLOGIC:  Mental status shows her to be alert and oriented x3.  She  followed one, two and three step commands.  Cranial nerve examination  revealed visual fields full, discs flat, spontaneous venous pulsation,  extraocular movements full, corneals present.  Facial sensation equal with  no facial motor asymmetry.  Hearing present.  Air conduction greater than  bone conduction.  Tongue midline.  Uvula midline.  Gag is present.  Sternocleidomastoid and Trapezius test normal.  Motor examination with  giveaway phenomenon in the upper extremities, but at least 4+/5 in the upper  extremities.  Sensory examination intact to pinprick touch, vibration.  Deep  tendon reflexes 2+ in upper extremities, 3+ at the knees and 2+ at the  ankles with downgoing plantar responses.   IMPRESSION:  Pain  syndrome. (729.5)   RECOMMENDATIONS:  Repeat initial blood studies and treat pain syndrome with  Neurontin.                                               Genene Churn. Sandria Manly, M.D.    JML/MEDQ  D:  08/16/2003  T:  08/17/2003  Job:  [id number]

## 2010-06-21 NOTE — Consult Note (Signed)
NAME:  Cynthia [name], Cynthia [name] NO.:  [id number]   MEDICAL RECORD NO.:  [id number]                   PATIENT TYPE:  INP   LOCATION:  3025                                 FACILITY:  [facility]   PHYSICIAN:  Genene Churn. Love, M.D.                 DATE OF BIRTH:  1965-07-16   DATE OF CONSULTATION:  08/11/2003  DATE OF DISCHARGE:                                   CONSULTATION   CONSULTING PHYSICIAN:  Genene Churn. Love, M.D.   The patient's address is [address] [place], [place], [address].   This 45 year old, right handed, white, married female was admitted from the  emergency room early in the morning hours, of August 11, 2003, for evaluation  of burning in her hands and numbness.   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  She has had a past  history of depression and anxiety.  She has been followed by Dr. Andee Poles, her psychiatrist, and recently had an increase in Lamictal over  the last month slowly up to 100 mg every day.  She had noted back in May  while on Depakote for mood stabilization that there was some numbness in her  hands and feet.  It was initially suspected this may be related to  medication and this was tapered off and the Lamictal was subsequently  started.  She had an MRI study of the brain without and with contrast  enhancement, at [facility], showing evidence of mild atrophy, Jun 22, 2003.  She has had some mild anemia recently discovered and has a known  history of hypothyroidism with thyroid replacement.  Over the last two days,  she had episodes of pain occurring in her hands described as burning  associated with numbness in her hands and in her feet.  There was no  associated bowel or bladder dysfunction.  In May, she had difficulty going  up and down stairs because of weakness and pain.  Recently, she has noted  recurrent  weakness.  She has had no loss of vision, double vision,  swallowing problems, slurred speech, black out spells, or seizures.  She was  having difficulty in using food utensils the night prior to admission and  came to the emergency room about 9 p.m., was seen by a physician's associate  and subsequently admitted about 4 a.m. on August 11, 2003.   PAST MEDICAL HISTORY:  1. Depression.  2. Pneumonia in 1999.  3. Hysterectomy in December 2004.  4. Hypothyroidism.   She works as a P.A. with [employer].   MEDICATIONS:  1. Lamictal 100 mg every day.  2. Zoloft 20 mg every day.  3. Synthroid 100 mcg every day.  4. Prempro one every day.  5. Klonopin 1 mg t.i.d.   PHYSICAL EXAMINATION:  GENERAL:  Revealed a well developed white female, no  acute distress.  A birth mark behind the left thigh, cafe ole.  VITAL SIGNS:  Blood pressure right and left arms, lying 100/60, heart rate  64, no bruits.  Negative Tinel sign.  NECK:  Mildly stiff.  MENTAL STATUS:  Alert, oriented x 3.  Cranial nerves:  Visual fields full.  Disks flat.  Mild bilateral ptosis.  Corneas present.  No 7th nerve palsy.  Hearing present.  Air conduction greater than bone production.  Tongue  midline.  Uvula midline.  Gag is present.  Motor:  Strength 5/5.  Two-point  discrimination graphesthesia equal in the hands.  Good finger-to-nose.  Sensory:  Intact to pinprick.  Toes __________  position vibration testing.  Deep tendon reflexes 2+ upper extremities, 3+ at the knee jerks, and 2+ at  the ankle jerks with downgoing plantar responses.   IMPRESSION:  1. Pain in the hands, code 729.5 with negative Tinel sign.  2. Paresthesias bilaterally upper and lower extremities, code 782.0.  3. History of hypothyroidism, code 244.9.  4. Depression, code 311.   PLAN:  Evaluate the patient with MRA study of the brain and neck with and  without contrast enhancement and vitamin B-12 level.  Sed rate also will be  obtained as  well as an ANA.                                               Genene Churn. Sandria Manly, M.D.    JML/MEDQ  D:  08/11/2003  T:  08/12/2003  Job:  [id number]   cc:   Talmadge Coventry, M.D.  [address]  [place]  [place] [address]  Fax: [phone]

## 2010-06-21 NOTE — H&P (Signed)
NAME:  Cynthia [name], Cynthia [name]                            ACCOUNT NO.:  [id number]   MEDICAL RECORD NO.:  [id number]                   PATIENT TYPE:  AMB   LOCATION:  SDC                                  FACILITY:  [facility]   PHYSICIAN:  Carrington Clamp, M.D.              DATE OF BIRTH:  17-Dec-1965   DATE OF ADMISSION:  DATE OF DISCHARGE:                                HISTORY & PHYSICAL   DATE OF SURGERY:  January 31, 2003   CHIEF COMPLAINT:  This is a 45 year old G0 complaining of severe chronic  endometriosis, bleeding, and dysmenorrhea unresponsive to medical or  surgical management.   HISTORY OF PRESENT ILLNESS:  The patient had presented as far back as  December 07, 2001 complaining of not only menstrual migraines but also  bleeding on birth control pills.  The patient had been on continuous birth  control pills trying to control her severe dysmenorrhea that was most likely  associated with endometriosis.  Previous to that the patient had tried many  years to conceive and had been unsuccessful.  In March 2004 the patient had  presented to me for the first time.  The patient had been switched to Depo-  Provera secondary to, also again, the endometriosis.  The patient had had  heavy bleeding and clots on the Depo-Provera and then had restarted the  Demulen 135.  The patient had stopped trying for pregnancy because of heavy  bleeding and clots, restarted the birth control pill, but had bleeding on  that.  The patient had then undergone two shots of Depo-Provera but had,  again still, normal heavy periods with clots and still severe lower quadrant  cramping with the periods.  At that time I had instructed the patient to use  Anaprox and switched her to Ortho Evra to see if we can control both the  bleeding and the dysmenorrhea.  The patient presented back about two weeks  later complaining of worsening of pain although the bleeding had decreased  though not gone away.  The pain was then  bilateral, radiating to the rectum.  The patient was then scheduled for a diagnostic laparoscopy and  hysteroscopy.  The patient underwent a diagnostic laparoscopy and  hysteroscopy on April 22, 2002.  The findings were that the patient had very  boggy and soft uterus.  There were two __________ defects noted in the right  and left cul-de-sac away from the ureters which were ablated with the triple  polar cautery.  There was no other evidence of powder burns, clear papulae,  or endometriosis on the ovaries.  There was a small fibroid in the anterior  portion of the uterus and the uterus appeared extremely boggy which would be  consistent with adenomyosis.  The hysteroscopy was essentially benign.   Immediately postoperatively the patient had complained of some abdominal  swelling and increased pressure and pain.  A CT exam  was completely normal  and indicated that the tympani was probably secondary to a mild ileus  secondary to the narcotics.  A few days later by the beginning of April the  patient was doing much better, still spotting after the surgery, and had  started her period which was heavy and cramping.  The patient was then  placed again on continuous oral contraceptives, Yasmin, and the patient was  to be seen in a couple of months.  The patient followed up in June stating  that the cramping was sometimes severe but seemed to be better since the  diagnostic laparoscopy and the bleeding was occasionally heavy but much  better.  The patient was on the continuous oral contraceptives and not  having regular periods.  In the meantime the patient had developed severe  depression and was having problems with continued bleeding.  The patient had  been switched to NuvaRing and had been treated with multiple medications for  her depression.  However, once the depression had been under control the  patient presented again in November 2004 complaining that the pain and  cramping, although  better, were still present and the patient was bleeding  every day despite continuous NuvaRing.  By December 2004 the patient stated  that the cramping and clotting again were back to the pre-surgery levels and  she was extremely uncomfortable despite the fact that she was still on the  continuous NuvaRing.  The patient also started that she was having problems  with pain with sex.   After a lengthy discussion about the risks, benefits, and alternatives of  hysterectomy and the implications of such, the patient really felt that she  was at the point where she desired definitive therapy and is prepared to  undergo hysterectomy.  It has been about two years since the patient and her  husband have decided to no longer attempt for pregnancy and the patient and  her husband have subsequently successfully adopted a baby.  The patient  understands that the hysterectomy may or may not relieve all of her pain  although it is likely to decrease her symptoms significantly.  I also had a  lengthy discussion with the patient of the risks, benefits, alternatives of  bilateral salpingo-oophorectomy.  The patient feels strongly that the  endometriosis is the cause of her pain and feels very strongly that the  ovaries are removed secondary to that to prevent further development of  endometriosis.  The patient understands that she will be off of all hormones  for about six weeks to two months in order to allow all the endometriosis to  have been essentially disappeared.  The patient understands that at 45 years  old she will be on hormone replacement therapy for a substantial number of  years to maintain her bone mineral density and to help with the vulva and  the vagina.   PAST MEDICAL HISTORY:  The patient has mitral valve prolapse but no history  of diabetes, high blood pressure, or other heart problems.   PAST SURGICAL HISTORY:  Laparoscopy and hysteroscopy as stated above.  PAST GYNECOLOGICAL  HISTORY:  No history of sexually transmitted diseases or  pelvic infections.  The patient has a history of endometriosis but no other  gynecologic problems.   PAST OBSTETRICAL HISTORY:  The patient has never been pregnant.   MEDICATIONS:  1. Zoloft 150 mg q.h.s.  2. Depakote ER 1500 mg q.h.s.  3. Ambien 10 mg q.h.s.  4. Maxalt 10  mg one p.o. p.r.n. migraines.   ALLERGIES:  No known drug allergies.   TOBACCO:  None.   PHYSICAL EXAMINATION:  VITAL SIGNS:  Blood pressure is 120/70.  HEENT:  Anicteric without lymphadenopathy.  HEART:  Regular rate and rhythm.  LUNGS:  Clear to auscultation bilaterally.  ABDOMEN:  Soft, nondistended.  No rebound, no guarding.  The patient has  some left lower quadrant pain and tenderness that extends to her left hip.  PELVIC:  External genitalia normal.  Cervix slightly friable.  Uterus  anteverted and about six weeks in size.  There are no masses and no  tenderness.  There is no studding in the cul-de-sac.  There is tenderness on  the left lower quadrant, however.  Bladder and anus were otherwise normal.   ASSESSMENT:  This is a 44 year old gravida 0 who has a longstanding history  of endometriosis and possible adenomyosis of the uterus.  The patient has  undergone multiple medical and surgical managements which have not  successfully taken care of her dysmenorrhea or her bleeding.  The patient  strongly desires definitive therapy and after a lengthy discussion about the  risks, benefits, and alternatives of both hysterectomy and bilateral  salpingo-oophorectomy the patient wishes to proceed with both.  Special  attention will be paid to left lower quadrant in which she has pain  extending to the hip which may indicate more endometriosis on that side.  The patient understands that she will be on hormone replacement therapy for  many years after she has undergone a hormone-free period for approximately  six weeks to two months.  The patient will  receive ampicillin before the  surgery secondary to history of mitral valve prolapse.  The patient will  undergo a total abdominal hysterectomy and bilateral salpingo-oophorectomy  as well as cautery ablation of any signs of endometriosis that may be easily  operated on.  The patient will also receive STDs in surgery.  The patient  understands all risks, benefits, and alternatives and does understand that  this surgery in and of itself may not take away all of her pain.  The  patient still wishes to proceed.                                               Carrington Clamp, M.D.    MH/MEDQ  D:  01/31/2003  T:  01/31/2003  Job:  [id number]

## 2010-06-21 NOTE — Op Note (Signed)
NAMEOPHA, MCGHEE NO.:  [id number]   MEDICAL RECORD NO.:  [id number]          PATIENT TYPE:  OBV   LOCATION:  0476                         FACILITY:  [facility]   PHYSICIAN:  Sharlet Salina T. Hoxworth, M.D.DATE OF BIRTH:  [birth year]   DATE OF PROCEDURE:  10/31/2003  DATE OF DISCHARGE:                                 OPERATIVE REPORT   PREOPERATIVE DIAGNOSIS:  Chronic cholecystitis.   POSTOPERATIVE DIAGNOSIS:  Chronic cholecystitis.   SURGICAL PROCEDURE:  Laparoscopic cholecystectomy with intraoperative  cholangiogram.   SURGEON:  Dr. Johna Sheriff.   ASSISTANT:  Gita Kudo, M.D.   ANESTHESIA:  General.   BRIEF HISTORY:  Cynthia [name] is a 45 year old female who has a two-week  history of episodic occasionally severe right midabdominal pain radiating to  the right upper quadrant, right lower quadrant, associated with nausea.  She  has had a fairly extensive workup including a negative CT scan of the  abdomen, gallbladder ultrasound showing a 6 mm gallbladder polyp, and a HIDA  scan today showing filling of the gallbladder but very poor emptying, with  15% ejection fraction at 30 minutes.  She has persistent fairly severe  symptoms today, and we have recommended proceeding with laparoscopic  cholecystectomy with cholangiogram.  The nature of the procedure,  indications, risks of bleeding, infection, bile leak, __________ were  discussed and understood.  She is now brought to the operating room for this  procedure.   DESCRIPTION OF THE OPERATION:  The patient was brought to the operating room  and placed in the supine position on the operating table, and general  endotracheal anesthesia was induced.  She received preoperative antibiotics.  PAS were placed.  The abdomen was widely sterilely prepped and draped.  Local anesthesia was used to infiltrate the trocar sites prior to the  incisions.  A 1 cm incision was made at the umbilicus and carried down to  the  midline fascia, which was sharply incised for 1 cm, and the peritoneum  entered under direct vision.  Mattress suture of 0 Vicryl, the Hasson trocar  was placed, and pneumoperitoneum established.  Under direct vision, a 10 mm  trocar was placed in the subxiphoid area, and two 5 mm trocars in the right  upper quadrant.  I did initially inspect the right colon and appendix due to  the patient's right lower quadrant pain.  The tip of the appendix could not  be well seen as the appendix was retrocecal, but the majority of the  appendix was visualized and was completely normal.  Colon was mildly  distended but otherwise normal.  The gallbladder had some chronic adhesions  of omentum up to it that were taken down carefully with cautery and scissor  dissection.  The fundus was grasped and elevated up to the liver, and the  infundibulum retracted inequilaterally.  There was no acute inflammation,  but it did appear mildly chronically thickened.  Fibrofatty tissue was  stripped off the neck of the gallbladder toward the porta hepatis.  Calot's  triangle was thoroughly dissected.  The cystic duct was identified, and the  cystic duct/gallbladder junction dissected 360 degrees, and the cystic  artery was seen coursing off the gallbladder wall.  When the anatomy was  cleared, the cystic duct was clipped at the gallbladder junction.  Operative  cholangiogram was obtained through the cystic duct.  This showed good  filling of normal common bile duct and intrahepatic ducts, with free flow  into the duodenum and no filling defects.  Following this, the  cholangiocatheter was removed, and the cystic duct was triply clipped  proximally and divided.  Cystic artery was doubly clipped proximally and  clipped distally and divided.  The gallbladder was then dissected free from  its bed using hook cautery and removed intact through the umbilicus.  Complete hemostasis was obtained of the gallbladder bed.  The right  upper  quadrant was thoroughly irrigated.  Trocars were removed under direct  vision.  All CO2 evacuated.  The mattress sutures __________ the umbilicus.  Skin incisions were closed with interrupted subcuticular 4-0 Monocryl and  Steri-Strips.  Sponge, needle, and instrument counts were correct.  Dry  sterile dressing was applied.  The patient was taken to the recovery room in  good condition.      BTH/MEDQ  D:  11/01/2003  T:  11/01/2003  Job:  [id number]

## 2010-06-21 NOTE — Op Note (Signed)
NAME:  Cynthia [name], Cynthia [name]                            ACCOUNT NO.:  [id number]   MEDICAL RECORD NO.:  [id number]                   PATIENT TYPE:  AMB   LOCATION:  SDC                                  FACILITY:  [facility]   PHYSICIAN:  Carrington Clamp, M.D.              DATE OF BIRTH:  Feb 11, 1965   DATE OF PROCEDURE:  04/22/2002  DATE OF DISCHARGE:                                 OPERATIVE REPORT   PREOPERATIVE DIAGNOSES:  1. Menometrorrhagia unresponsive to medical management.  2. Right and left lower quadrant pain.   POSTOPERATIVE DIAGNOSES:  1. Menometrorrhagia unresponsive to medical management.  2. Right and left lower quadrant pain.  3. Probable adenomyosis.   PROCEDURE:  1. Diagnostic laparoscopy.  2. Cautery of endometriosis.  3. Hysteroscopy.  4. Dilatation and curettage.  5. Endocervical curettage.   SURGEON:  Carrington Clamp, M.D.   ANESTHESIA:  General endotracheal anesthesia.   ESTIMATED BLOOD LOSS:  Minimal.   INTRAVENOUS FLUIDS:  1500 mL.   URINE OUTPUT:  Not measured.   COMPLICATIONS:  None.   FINDINGS:  The findings were a very boggy soft uterus.  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  There was no  other evidence of powder burns, clear papules or any other endometriosis on  the ovaries, the broad ligaments, the bowel or the appendix.  There was  normal liver edge, appendix, ovaries and bladder flap.  The tubes appeared  normal as well.  The uterus, however, as stated above was extremely boggy or  soft and has a very small fibroid on the anterior portion, but there was no  endometriosis on the surface of the serosa.   The hysterostomy revealed no polyps or fibroids in the endometrial cavity.  There was, however, a very friable cervix.   MEDICATIONS:  None.   PATHOLOGY:  Uterine curettings and endocervical curettage.   DESCRIPTION OF  PROCEDURE:  After adequate general anesthesia was achieved,  the patient was prepped and draped in a sterile fashion lithotomy position.  A speculum was placed in the vagina and the tenaculum placed in the cervix,  and a Kahn cannula was placed in the cervical canal.  The bladder was  drained with a red rubber catheter and speculum removed.   Attention was then turned to the abdomen where a 2 cm infraumbilical  incision was made with the scalpel.  The Veress needle was passed into the  abdomen without aspiration of bowel contents or blood, and the abdomen was  insufflated without complications.  The 10 mm trocar was placed without  complications, and under visualization with the scope, a second 5 mm trocar  was placed midline.  A third trocar 5 mm was also placed just lateral to the  right round ligament and inguinal canal.  The above findings were noted with careful systematic inspection through the  entire pelvis.  The triple polar cautery was then passed into the abdomen,  and the two Matterson defects, one just below the uterosacral on the right-  hand side and another just posterior to the cervix within the uterosacral on  the left hand side were cauterized with triple polar cautery.  The ureters  were identified during the procedure bilaterally, and the cautery was  undertaken away from the ureters.   The instruments were removed and the abdomen was desufflated.  Attention was  then turned to the vagina.  Speculum was placed in the vagina again and the  Kahn cannula removed.  The cervix was dilated with Ha dilators.  The  hysteroscope was passed and the above findings noted again.  Curettings of  the uterus were then performed and sent to pathology.  ECC was performed  since the cervix seemed to be significant friable.  Hemostasis was achieved,  and all instruments withdrawn from the vagina.   After changing gloves, an additional look was taken and the uterus was  normal.  Then  all instruments were withdrawn, and the trocars were withdrawn  from the abdomen, and the abdomen desufflated.  The 10 mm trocar site fascia  was closed with a figure-of-eight stitch of 0 Vicryl.  The 5 mm skin  incisions were then closed with through and through stitches of 2-0 Vicryl.  The incisions were then closed with Therabond.  The patient tolerated the  procedure well and was returned to the recovery room in stable condition.                                               Carrington Clamp, M.D.    MH/MEDQ  D:  04/22/2002  T:  04/22/2002  Job:  [id number]

## 2010-06-21 NOTE — H&P (Signed)
NAMECHE, BELOW NO.:  [id number]   MEDICAL RECORD NO.:  [id number]          PATIENT TYPE:  IPS   LOCATION:  0505                          FACILITY:  [facility]   PHYSICIAN:  Jeanice Lim, M.D. DATE OF BIRTH:  1965-06-07   DATE OF ADMISSION:  02/05/2004  DATE OF DISCHARGE:                         PSYCHIATRIC ADMISSION ASSESSMENT   IDENTIFYING INFORMATION:  This is a 45 year old white female who is married.  This is a voluntary admission.   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  She had made a decision with  her husband to take a less stressful job with less on-call and 24-hour  responsibilities and submitted her resignation for work about three weeks  ago.  Since that time, over the holidays her schedule has become more  stressful.  She became overwhelmed with work, was feeling agitated, had not  slept after working, became agitated on the night of January 1 and took  approximately five Ambien tablets and seven Klonopin 0.5 mg in order to make  it all go away so she could calm herself down and try to get some rest.  She  endorses having some anxiety and agitation because of the holiday schedule.  She denies any active intent towards suicide.  No homicidal ideation.  No  hallucinations.  Says she is motivated to live to care for her son and her  husband whom she loves very much.   PAST PSYCHIATRIC HISTORY:  This is the patient's first psychiatric inpatient  admission.  She has been followed by Valinda Hoar, the nurse practitioner  at Dr. Tish Men office.  She has a history of an episode of severe  depression with onset May 2004 and was out of work 10 weeks at that time  after going through the adoption process and finding that the child was ill  and the adoption failing.  She denies any prior history  of suicide attempt.   FAMILY HISTORY:  Unknown because the patient is adopted.   SOCIAL HISTORY:  The patient is married.  Husband is a Emergency planning/management officer.  They  have a 41-month-old child at home now that they adopted who is doing well.  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   ALCOHOL AND DRUG HISTORY:  The patient denies any substance abuse.   PAST MEDICAL HISTORY:  The patient has a history of migraine headaches.   MEDICATIONS:  1.  Klonopin 0.5 mg p.o. which she has used on a p.r.n. basis.  2.  Lamictal 50 mg daily, she was previously taking 100 mg daily in last 72      hours a couple of weeks ago and is due now to return to 100 mg daily.  3.  Zoloft 200 mg daily.  Has been on this since May 2004.  4.  Toprol 50 mg p.o.  b.i.d. for migraine prevention.  5.  Imitrex 100 mg p.o. at onset of migraine, may repeat in two hours.  Has      a maximum of 200 mg q.24h.  6.  Prempro 0.3/1.5 mg one tablet p.o. daily.  7.  Synthroid 100 mcg p.o. daily.  8.  Geodon she takes 120 mg p.o. q.h.s.  9.  Seroquel 25 mg 1-2 tabs q.6h. p.r.n. for acute agitation.   DRUG ALLERGIES:  DEPAKOTE and REMERON and he Remeron has caused mood  fluctuation in the past.   POSITIVE PHYSICAL FINDINGS:  This is a well-developed, well-nourished female  who is in no acute distress.  Hygiene is adequate.  Grooming good.  Dress is  appropriate.  She is pleasant and cooperative for the exam.  She is 5 feet 7-1/2 inches tall, 193 pounds on admission.  Temperature 98.7, pulse 97, blood pressure 115/71, respirations 20.  HEAD:  Normocephalic, atraumatic.  EENT:  PERRL.  Sclera nonicteric.  Extraocular movements are within normal  limits.  No rhinorrhea.  Oropharynx is satisfactory condition.  Neck supple,  no thyromegaly, no JVD, no carotid bruit.  CHEST:  Symmetrical.  LUNGS:  Clear to  auscultation.  BREAST:  Deferred.  CARDIOVASCULAR:  S1/S2 is heard.  No clicks, murmurs, gallops or extra  sounds.  ABDOMEN:  Rounded, soft, nontender.  No masses appreciated.  GENITOURINARY:  Deferred.  EXTREMITIES:  No edema.  Pink and warm.  SKIN:  Pale in tone.  Hair distribution normal.  Skin is intact, no rash.  NEUROLOGIC:  Cranial nerves II-XII are intact.  Extraocular movements are  normal.  Motor is smooth.  Sensory is grossly intact.  Cerebellar function  is intact for rapid alternating movements.  Gait is normal with normal arm  swing.  No signs of EPS.  Neurologic is nonfocal.   DIAGNOSTIC STUDIES:  Reveal CBC is within normal limits.  Hemoglobin 12.5,  hematocrit 36.5.  Electrolytes are normal.  Glucose was 90, sodium 136,  potassium 3.8.  BUN 12, creatinine 0.7.  Liver enzymes are normal.  Her  total bilirubin is 0.5.  SGOT 22, SGPT 18, alkaline phosphatase 99.  Albumin  mildly decreased at 3.4.  TSH is currently pending.  Acetaminophen level  done in the emergency room was less than 10.  Salicylate level less than 4.  Alcohol level was negative.  Urine pregnancy test negative.  Urine drug  screen was positive for benzodiazepines, negative for all other substances.   MENTAL STATUS EXAM:  This is a fully alert female, pleasant, cooperative  with a stable affect.  Affect is appropriate.  She tracks conversation well.  Eye contact is good.  Speech is normal in pace, tone and amount.  Mood is  euthymic.  She does feel a bit guilty about the events over the weekend, is  a bit ashamed of herself and feels she was impulsive in taking the Klonopin  and realizes now that she had been a Seroquel prescription but did not take  it regularly and that may have been more appropriate.  She had been feeling  agitated and overwhelmed with poor sleep and fatigue working too many hours  over the holiday weekend.  Thought process is logical, goal directed, coherent.  Denies any active  suicidal ideation.  Admits to impulsivity and  poor judgment.  No homicidal thoughts.  No auditory or visual  hallucinations.  Cognitively she is intact and oriented x3.  Insight is  good.  Impulse control and judgment  guarded.  Intellect within normal  limits.   DIAGNOSIS:  Axis I.  1. 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  1.  Bipolar-2 disorder.  Axis II.  No diagnosis.  Axis III.  Migraine headaches.  Axis IV.  Severe job stress, having a supportive husband is an asset to her.  Axis V.  Current 30, past year 70+.   PLAN:  Voluntarily admit the patient with q.15 minute checks in place to  evaluate her suicidal ideation and alleviate any suicidal thoughts.  We are  going to go ahead and increase her Lamictal back to 50 mg p.o. b.i.d.  Ask  the case  manager to talk with her husband.  Start discharge planning.  She is able to  contract for safety on the unit and we will evaluate her progress in the  morning.  Otherwise we will continue with her current medications and  increase her Lamictal.  Estimated length of stay is five days.  We have  notified her attending psychiatrist, Dr. Nolen Mu.     Marg   MAS/MEDQ  D:  02/06/2004  T:  02/06/2004  Job:  [id number]   cc:   Andee Poles, M.D.  [address], [address]  [place], [place] [address]  Fax: [phone]

## 2010-06-21 NOTE — Discharge Summary (Signed)
Cynthia [name], LASHLEY NO.:  [id number]   MEDICAL RECORD NO.:  [id number]          PATIENT TYPE:  IPS   LOCATION:  0505                          FACILITY[facility]   PHYSICIAN:  Geoffery Lyons, M.D.      DATE OF BIRTH:  Sep 16, 1965   DATE OF ADMISSION:  02/05/2004  DATE OF DISCHARGE:  02/07/2004                                 DISCHARGE SUMMARY   CHIEF COMPLAINT/HISTORY OF PRESENT ILLNESS:  This was the first admission to  [facility] for this 45 year old white female.  She has a  past history of mood lability and she therefore has type 3/4 bipolar  disorder.  She apparently was under a lot of stress at work.  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  She said that she could not take  it anymore.  She submitted her resignation three weeks prior to this  admission.  Since then her schedule has become more stressful.  Became  overwhelmed with work, working her three weeks notice.  She was feeling  agitated, unable to sleep.  The night of February 04, 2003 she took five  Ambien, seven Klonopin 0.5 because she wanted to make everything go away.  She wanted to calm herself down and tried to get some rest.  She was having  a lot of anxiety and agitation because of the holiday schedule.  Denied any  active suicidal ideations.   PAST PSYCHIATRIC HISTORY:  First time at [facility], has been  followed by Valinda Hoar and Dr. Nolen Mu.  History of severe depression  in May 2004, out of work for 10 weeks.   __________ HISTORY:  Denies the use or abuse of any substance.   PAST MEDICAL HISTORY:  Migraine headaches.   MEDICATIONS:  Klonopin 0.5 on an as needed basis, Lamictal 50 mg daily,  Zoloft 200 mg daily, Toprol 50 mg twice a day, Imitrex 100 evidence of  migraine, Prempro 0.3-1.5 one tablet daily, Synthroid 100 mcg daily, Geodon  120 mg daily, Seroquel 25 one to two every six hours p.r.n. agitation.   PHYSICAL EXAMINATION:  Physical  exam was performed and had no acute  findings.   LABORATORY WORKUP:  CBC within normal limits, hemoglobin 12.5, hematocrit  36.5, glucose 90, sodium 136, potassium 3.8.  Liver enzymes within normal  limits.  TSH 2.333.  Urine pregnancy test was negative.  Urine drug screen  positive for benzodiazepine that she has been prescribed.   MENTAL STATUS EXAM:  She was an alert female, cooperative, tracks  conversation well.  Eye contact was appropriate.  Speech was normal in pace,  tone and amount.  Mood was euthymic and anxious.  Affect broad as she was  feeling guilty and ashamed about the events over the weekend.  She felt that  it was important that she took Klonopin, did not want to die and was feeling  agitated and overwhelmed with poor sleep and fatigue due to the many hours  she was working over the weekend holiday.  Her thought processes are  coherent  and relevant.  There is no suicidal or homicidal ideations.  No  evidence of hallucinations.  Cognition was preserved.   ADMITTING DIAGNOSES:  Axis I.  1. Bipolar disorder.  1.  Depression.  Axis II.  No diagnosis.  Axis III.  Migraine headaches.  Axis IV.  Moderate.  Axis V.  Admission 30, highest in the last year 80.   COURSE IN THE HOSPITAL:  She was admitted and started in individual and  group psychotherapy.  She was maintained on her medications.  The only  recent change she has made was Lamictal that she had decreased to 50 and I  went ahead and had her return to 100 mg daily.  She felt much better just by  being away from the situation.  She felt that she made the right decision  when she resigned from her job.  She is going to be working at a less  stressful work situation.  She was looking forward to that.  The fact that  she was not going to return to work the few days left, decreased the anxiety  markedly so she was in full control of reality.  Mood was ___________  endorsed that she was safe.  The family agreed with her.   She was compliant  with medication.  So we went ahead and discharged her to outpatient follow  up.   DISCHARGE DIAGNOSES:  Axis I.  1. Bipolar disorder.  1.  Depression.  Axis II.  No diagnosis.  Axis III.  1. Migraine headaches.  1.  Hypothyroidism.  Axis IV.  Moderate.  Axis V.  Discharge 55-60.   PLAN:  Discharged home on Lamictal 50 mg twice a day, Prempro one daily,  Zoloft 100 mg two daily, Synthroid 100 mcg daily, Toprol XL 50 mg daily,  Geodon 60 two at night, Imitrex as needed, Seroquel 25 one to two every six  hours as needed for anxiety and agitation, Klonopin 0.5 one to two times a  day as needed for anxiety.  Follow up with Dr. Nolen Mu and Valinda Hoar.      IL/MEDQ  D:  03/01/2004  T:  03/01/2004  Job:  [id number]

## 2010-06-21 NOTE — Discharge Summary (Signed)
NAME:  Cynthia [name], Cynthia [name]                            ACCOUNT NO.:  [id number]   MEDICAL RECORD NO.:  [id number]                   PATIENT TYPE:  INP   LOCATION:  3022                                 FACILITY:  [facility]   PHYSICIAN:  Cynthia [name], M.D.                 DATE OF BIRTH:  10-Jan-1966   DATE OF ADMISSION:  08/16/2003  DATE OF DISCHARGE:  08/22/2003                                 DISCHARGE SUMMARY   DISCHARGE DIAGNOSIS:  Paresthesias/myalgias/generalized weakness.  Evaluation essentially negative for an organic etiology.   SECONDARY DISCHARGE DIAGNOSES:  1. Admission to the hospital on August 11, 2003, with paresthesias, myalgias     and generalized weakness.  Work-up essentially negative.  2. Depression.  3. Generalized anxiety.  4. Hypothyroidism.  5. Status post total abdominal hysterectomy and bilateral salpingo-     oophorectomy secondary to endometriosis.   DISCHARGE MEDICATIONS:  1. Neurontin 300 mg b.i.d.  2. Ibuprofen 400 mg p.o. t.i.d. p.r.n.  3. Synthroid 100 mcg daily.  4. Zoloft 200 mg daily.  5. Ambien 10 mg q.h.s.  6. Klonopin 1 mg t.i.d. p.r.n.  7. Prempro 0.3/1.25 mg daily.  8. MS Contin 15 mg b.i.d.  9. Oxycodone 5 mg b.i.d. p.r.n.   DISPOSITION:  The patient was discharged to home in improved and stable  condition on August 22, 2003.  She was advised to follow up with her primary  care physician, Dr. Susann [name], in three to seven days.   CONSULTATIONS:  1. Cynthia [name], M.D.  2. Cynthia [name], M.D.   PROCEDURES PERFORMED:  1. Lumbar puncture, per radiology.  Opening pressure 20 cm; 15 mL of clear     CSF collected.  2. Right upper extremity PICC.   HISTORY OF PRESENT ILLNESS:  The patient is a 45 year old white female with  a past medical history significant for a recent discharge from the hospital  on August 12, 2003, for generalized weakness, paresthesias and myalgias.  The  evaluation and work-up were essentially negative.  During the recent  hospitalization a week prior, the patient's MRI of the brain was essentially  negative, however, there was a question of a 3 mm focus in the white matter  just above the corpus callosum.  The patient was further evaluated with an  MRI of the cervical spine on August 11, 2003.  The MRI of the cervical spine  revealed insignificant spondylosis at the C3 to C5 and C4 to C5 areas.  There were no cord abnormalities.  The lab data from the previous  hospitalization were essentially negative.  The patient was discharged to  home on August 12, 2003.  In the interim, the patient's primary care physician,  Dr. Susann [name], prescribed MS Contin and oxycodone for pain.  These medications  apparently were ineffective for relieving the patient's pain.  The patient,  therefore presented again to the hospital secondary to  worsening and  progressive muscle pain primarily in the shoulders, arms, hands, and the  legs.  She again complained of burning and numbness in her extremities.  She  also complained of generalized weakness in her shoulders and her arms,  greater than her legs.  There was no associated rash or swelling in her  joints.  No associated fever or chills.  No associated diaphoresis,  vomiting, abdominal pain, or diarrhea.  The patient had not been exposed to  any pets or tick bites.  The patient also had a mild global headache which  started on the day of admission.  She also had some mild intermittent  nausea.  The patient was therefore admitted for further evaluation and  management.   HOSPITAL COURSE:  PARESTHESIAS/MYALGIAS/GENERALIZED WEAKNESS:  The initial  management included pain management with Dilaudid 2 mg IV every three hours,  MS Contin 15 mg q.12h., and ibuprofen 400 mg t.i.d.  Dr. Sandria [name], neurologist,  was consulted.  He recommended adding Neurontin at 300 mg b.i.d. which was  done.  The patient was restarted on her chronic medications with Zoloft,  Synthroid, Klonopin, Ambien and Prempro.   The patient was also treated with  Tylenol 650 mg every four hours as needed for additional pain relief.  The  patient demonstrated no focal neurological findings on examination with  exception of a mildly depressed hand grip bilaterally.  She also experienced  some generalized myalgias in the upper extremities greater than the lower  extremities.  The patient's sensory examination appeared to be intact.  Per  the recommendations of Dr. Sandria [name], further investigative studies were ordered.  These included a CK which was within normal limits at 66 and the ANA which  was ordered was negative.  The sed rate was within normal limits at 25.  Rheumatoid factor was less than 20.  Thyroid antibodies were negative.  The  rbc folate level was within normal limits at 293.  The monospot screen was  also negative.  Per Dr. Sandria [name], an LP was ordered for further evaluation,  particularly to evaluate abnormalities in the patient's CSF.  The lumbar  puncture was performed by radiology under fluoroscopy.  The opening pressure  was 20 cm of water.  There were 15 mL of clear CSF collected.  The CSF was  sent for additional studies.  The cell count of the CSF was as follows:  wbc  count was 1 and the rbc count was 6.  There were rare lymphocytes seen.  The  CSF protein was mildly elevated at 47 and the CSF glucose was within normal  limits at 55.  Given this mild increase in protein, Dr. Sandria [name] ordered  additional studies.  Oligoclonal bands were assessed from the CSF.  The CSF  IgG was within normal limits at 2.5.  A VDRL screen of the CSF was also  assessed and found to be nonreactive.  The patient's HIV status was assessed  and the HIV antibody was nonreactive as well.  A Lyme disease titer was  ordered as well and again was negative.   After several days of supportive treatment and the initiation of Neurontin  and ibuprofen, the patient's symptoms gradually lessened.  She required less and less intravenous  Dilaudid.  At the time of hospital discharge, she was  able to ambulate without any discomfort at all.   The IV Dilaudid was discontinued at the time of hospital discharge.  The  Neurontin was advised for outpatient treatment of the  paresthesias at 300 mg  b.i.d.  The patient had some mild residual numbness in her fingertips,  however, this had improved significantly.  The patient was also advised to  continue the anti-inflammatory treatment with ibuprofen as needed.  The  patient was also advised to continue the oxycodone and MS Contin only if she  needed it for the return of pain.  The patient stated that she doubted that  she needed the MS Contin and oxycodone, however, she did have some  medications left over from the prescription that was written by her primary  care physician, Dr. Susann [name].  The etiology of the patient's  paresthesias  and pain syndrome is unknown.   It is important to note that Dr. Maurice March, infectious disease physician, was  consulted for his input.  There was a question regarding whether or not the  patient's symptoms were secondary to an infectious etiology.  Per Dr. Bonnetta Barry  assessment, the patient's symptoms were unlikely infectious in origin.                                                Cynthia [name], M.D.    DF/MEDQ  D:  08/29/2003  T:  08/29/2003  Job:  [id number]   cc:   Sharlot Gowda, M.D.  [address]  [place], [place] [address]  Fax: [phone]   Cynthia [name], M.D.  [address]  [address]  [place]  [place] [address]  Fax: [phone]

## 2010-06-21 NOTE — H&P (Signed)
Cynthia [name], Cynthia [name] NO.:  [id number]   MEDICAL RECORD NO.:  [id number]          PATIENT TYPE:  OBV   LOCATION:  0476                         FACILITY:  [facility]   PHYSICIAN:  Sharlet Salina T. Hoxworth, M.D.DATE OF BIRTH:  1965-11-16   DATE OF ADMISSION:  10/31/2003  DATE OF DISCHARGE:                                HISTORY & PHYSICAL   CHIEF COMPLAINT:  Right-sided abdominal pain.   HISTORY OF PRESENT ILLNESS:  Cynthia [name] is a 45 year old female, who was in  her usual state of good health until about 2 weeks ago when while on a trip  to the beach, she developed a attack sudden onset of right upper quadrant  abdominal pain which she describes as colicky and severe.  This was  associated with some nausea but no vomiting.  The pain radiated both to her  back and to her right lower quadrant.  The pain gradually improved over 24  hours, and she then saw Dr. Lina Sar for work-up.  Although the pain  improved, it did not go away entirely, and she continued to feel generally  somewhat mildly ill with some mild discomfort and lack of appetite.  She has  had a work-up which started with a CT scan of abdomen and pelvis that was  negative.  Following this, a gallbladder ultrasound was obtained which  showed a 6 mm gallbladder polyp with normal gallbladder wall and common bile  duct.  HIDA scan was subsequently performed today which showed filling of  the gallbladder with patent cystic duct but marked decreased ejection  fraction of 15% at 30 minutes.  Last night, she had onset again of severe  colicky right upper quadrant pain, both radiating to her right lower  quadrant and to her back.  She again had some mild nausea.  She has felt  that the pain has worsened somewhat over the last couple of weeks when she  tries to eat.  She has no previous history of chronic GI complaints or  similar symptoms.  Bowel movements have been normal without blood.  No  diarrhea.  No urinary  burning, frequency.   PAST MEDICAL HISTORY:  1.  Surgery is significant for hysterectomy through Pfannenstiel incision,      December 2004, for endometriosis.  2.  She has a history of depression.  3.  Hypothyroidism.   MEDICATIONS:  1.  Prempro and progesterone combination.  2.  Zantac p.r.n.  3.  Zoloft 200 mg daily.  4.  Synthroid 100 mcg daily.   ALLERGIES:  She has no known drug allergies.   SOCIAL HISTORY:  She is married.  Works as a Engineer, maintenance Cardiology.  No  cigarette or alcohol use.   FAMILY HISTORY:  Noncontributory.   REVIEW OF SYSTEMS:  GENERAL:  No fever, chills, weight change.  RESPIRATORY:  No shortness of breath, cough, wheezing.  CARDIAC:  Gives a history of a  slight heart murmur.  No chest pain or palpitations.  ABDOMEN/GI:  As above.  GU:  No urinary burning frequency.   PHYSICAL EXAMINATION:  VITAL SIGNS:  Temperature 99.5, pulse 89,  respirations 20, blood pressure 124/81.  GENERAL:  Well-nourished, well-developed white female who appears somewhat  uncomfortable.  SKIN:  Warm, dry, without rash or infection.  HEENT:  No mass or thyromegaly.  Sclerae nonicteric.  LUNGS:  Clear to auscultation without increased work of breathing.  CARDIAC:  Regular rate and rhythm.  I cannot hear a murmur.  ABDOMEN:  There is tenderness mostly in the right mid abdomen but also in  the right upper quadrant and some in the right lower quadrant.  Some  guarding in the right mid abdomen.  No mass or hepatosplenomegaly detected.  EXTREMITIES:  No __________ deformity.  NEUROLOGIC:  Alert and oriented.   LABORATORY DATA:  LFTs, lipase, CBC repeated today normal.  Sed rate has  been elevated at 27.   ASSESSMENT AND PLAN:  Probable acalculous cholecystitis.  Her symptoms are  certainly consistent with biliary tract disease, and she has a somewhat  abnormal gallbladder ultrasound with a 6 mm polyp and significantly delayed  ejection on her HIDA scan.  Would doubt  appendicitis or colitis with a  negative CT and the time course of the illness and lack of lower GI  symptoms.  I think at this point, it is reasonable to proceed with  laparoscopic cholecystectomy and cholangiogram.  Would also examine her  appendix laparoscopically and remove it if it appeared abnormal.  She  understands and agrees with this plan.  The nature of the procedure and its  indications and risks were discussed with the patient and her husband, and  she is admitted for this procedure.      BTH/MEDQ  D:  10/31/2003  T:  10/31/2003  Job:  [id number]   cc:   Lina Sar, M.D. [facility]

## 2010-07-15 ENCOUNTER — Encounter: Payer: Self-pay | Admitting: Cardiovascular Disease

## 2010-10-29 DIAGNOSIS — F32A Depression, unspecified: Secondary | ICD-10-CM | POA: Insufficient documentation

## 2010-10-29 DIAGNOSIS — R079 Chest pain, unspecified: Secondary | ICD-10-CM | POA: Insufficient documentation

## 2011-02-14 DIAGNOSIS — G894 Chronic pain syndrome: Secondary | ICD-10-CM | POA: Insufficient documentation

## 2011-02-21 ENCOUNTER — Other Ambulatory Visit: Payer: Self-pay | Admitting: Cardiovascular Disease

## 2011-02-21 NOTE — Telephone Encounter (Signed)
Refilled atorvastatin

## 2011-03-13 DIAGNOSIS — M79609 Pain in unspecified limb: Secondary | ICD-10-CM | POA: Insufficient documentation

## 2011-03-22 ENCOUNTER — Emergency Department (HOSPITAL_COMMUNITY)
Admission: EM | Admit: 2011-03-22 | Discharge: 2011-03-23 | Disposition: A | Discharge status: Home / Self Care | Payer: Commercial Managed Care - PPO | Attending: Emergency Medicine | Admitting: Emergency Medicine

## 2011-03-22 ENCOUNTER — Emergency Department (HOSPITAL_COMMUNITY): Payer: Commercial Managed Care - PPO

## 2011-03-22 ENCOUNTER — Telehealth: Payer: Self-pay | Admitting: Physician Assistant

## 2011-03-22 ENCOUNTER — Encounter (HOSPITAL_COMMUNITY): Payer: Self-pay | Admitting: Emergency Medicine

## 2011-03-22 DIAGNOSIS — I319 Disease of pericardium, unspecified: Secondary | ICD-10-CM | POA: Insufficient documentation

## 2011-03-22 DIAGNOSIS — E039 Hypothyroidism, unspecified: Secondary | ICD-10-CM | POA: Insufficient documentation

## 2011-03-22 DIAGNOSIS — E785 Hyperlipidemia, unspecified: Secondary | ICD-10-CM | POA: Insufficient documentation

## 2011-03-22 DIAGNOSIS — R079 Chest pain, unspecified: Secondary | ICD-10-CM | POA: Insufficient documentation

## 2011-03-22 DIAGNOSIS — R110 Nausea: Secondary | ICD-10-CM | POA: Insufficient documentation

## 2011-03-22 HISTORY — DX: Disease of pericardium, unspecified: I31.9

## 2011-03-22 LAB — CBC
HCT: 43.1 % (ref 36.0–46.0)
MCHC: 31.6 g/dL (ref 30.0–36.0)
MCV: 93.7 fL (ref 78.0–100.0)
Platelets: 235 10*3/uL (ref 150–400)
RDW: 13.3 % (ref 11.5–15.5)
WBC: 5.9 10*3/uL (ref 4.0–10.5)

## 2011-03-22 LAB — POCT I-STAT TROPONIN I: Troponin i, poc: 0 ng/mL (ref 0.00–0.08)

## 2011-03-22 LAB — COMPREHENSIVE METABOLIC PANEL
AST: 52 U/L — ABNORMAL HIGH (ref 0–37)
Albumin: 4.1 g/dL (ref 3.5–5.2)
BUN: 11 mg/dL (ref 6–23)
Calcium: 9.8 mg/dL (ref 8.4–10.5)
Creatinine, Ser: 0.84 mg/dL (ref 0.50–1.10)
Total Protein: 7.9 g/dL (ref 6.0–8.3)

## 2011-03-22 LAB — D-DIMER, QUANTITATIVE: D-Dimer, Quant: 0.35 ug/mL-FEU (ref 0.00–0.48)

## 2011-03-22 LAB — TROPONIN I: Troponin I: <0.3 ng/mL (ref <0.30)

## 2011-03-22 MED ORDER — ONDANSETRON HCL 4 MG/2ML IJ SOLN
INTRAMUSCULAR | Status: AC
  Filled 2011-03-22: qty 2

## 2011-03-22 MED ORDER — ONDANSETRON HCL 4 MG/2ML IJ SOLN
4.0000 mg | Freq: Once | INTRAMUSCULAR | Status: AC
  Administered 2011-03-22: 4 mg via INTRAVENOUS
  Filled 2011-03-22: qty 2

## 2011-03-22 MED ORDER — ASPIRIN 81 MG PO CHEW
324.0000 mg | CHEWABLE_TABLET | Freq: Once | ORAL | Status: AC
  Administered 2011-03-22: 324 mg via ORAL
  Filled 2011-03-22: qty 4

## 2011-03-22 MED ORDER — MORPHINE SULFATE 4 MG/ML IJ SOLN
4.0000 mg | Freq: Once | INTRAMUSCULAR | Status: DC
  Filled 2011-03-22: qty 1

## 2011-03-22 MED ORDER — MORPHINE SULFATE 4 MG/ML IJ SOLN
4.0000 mg | Freq: Once | INTRAMUSCULAR | Status: AC
  Administered 2011-03-22: 4 mg via INTRAVENOUS
  Filled 2011-03-22: qty 1

## 2011-03-22 MED ORDER — NITROGLYCERIN 0.4 MG SL SUBL
0.4000 mg | SUBLINGUAL_TABLET | SUBLINGUAL | Status: DC | PRN
  Administered 2011-03-22: 0.4 mg via SUBLINGUAL
  Filled 2011-03-22: qty 25

## 2011-03-22 MED ORDER — ONDANSETRON HCL 4 MG/2ML IJ SOLN
4.0000 mg | Freq: Once | INTRAMUSCULAR | Status: AC
  Administered 2011-03-22: 4 mg via INTRAVENOUS

## 2011-03-22 MED ORDER — SODIUM CHLORIDE 0.9 % IV SOLN
20.0000 mL | INTRAVENOUS | Status: DC
  Administered 2011-03-22: 20 mL via INTRAVENOUS

## 2011-03-22 MED ORDER — NITROGLYCERIN 2 % TD OINT
1.0000 [in_us] | TOPICAL_OINTMENT | Freq: Four times a day (QID) | TRANSDERMAL | Status: DC
  Administered 2011-03-22: 1 [in_us] via TOPICAL
  Filled 2011-03-22: qty 1

## 2011-03-22 NOTE — ED Notes (Signed)
New and old ecg shown to edp Knapp/copies with pt

## 2011-03-22 NOTE — ED Notes (Signed)
Patient complaining of left-sided chest pain that started around 1100 today; reports nausea, but denies shortness of breath.  Denies radiation of pain.  Patient has history of pericarditis; patient states that this chest pain feels different than when she had pericarditis.

## 2011-03-22 NOTE — ED Provider Notes (Addendum)
History     CSN: [id number]  Arrival date & time 03/22/11  7829   First MD Initiated Contact with Patient 03/22/11 2041      Chief Complaint  Patient presents with  . Chest Pain    (Consider location/radiation/quality/duration/timing/severity/associated sxs/prior treatment) Patient is a 46 y.o. female presenting with chest pain.  Chest Pain  Pertinent negatives for past medical history include no CAD, no DVT and no PE.  Premature CAD in family: unkown.  Procedure history is positive for cardiac catheterization (2 years ago, normal).    Pt started having chest pain this morning after taking some immitrex for a migraine headache.  The pain has been intermittent.  At 5 pm it increased.  The pain was as severe as a 9/10 but now it is a 4.  She has never had this episode with imitrex in the past.  The pain is more squeezing and now it is an ache.   Nausea but no vomiting.  No fever or cough.  No leg swelling.  Past Medical History  Diagnosis Date  . Diastolic congestive heart failure   . Hyperlipemia   . Hypothyroidism   . History of depression   . History of migraines   . Rosacea   . Anxiety   . Pericarditis     Past Surgical History  Procedure Date  . Total abdominal hysterectomy w/ bilateral salpingoophorectomy   . Cholecystectomy   . Cardiac catheterization 11/15/08    SMOOTH AND NORMAL  . Shoulder surgery   . Abdominal hysterectomy     Family History  Problem Relation Age of Onset  . Adopted: Yes    History  Substance Use Topics  . Smoking status: Never Smoker   . Smokeless tobacco: Not on file  . Alcohol Use: No    OB History    Grav Para Term Preterm Abortions TAB SAB Ect Mult Living                  Review of Systems  Cardiovascular: Positive for chest pain.    Allergies  Ampicillin  Home Medications   Current Outpatient Rx  Name Route Sig Dispense Refill  . ALPRAZOLAM 0.5 MG PO TABS Oral Take 0.5 mg by mouth daily as needed. For anxiety     . ESTRATEST PO Oral Take by mouth daily.      Marland Kitchen LEVOTHYROXINE SODIUM 75 MCG PO TABS Oral Take 75 mcg by mouth daily.      Marland Kitchen METOPROLOL TARTRATE 100 MG PO TABS Oral Take 100 mg by mouth 2 (two) times daily.    . SUMATRIPTAN SUCCINATE 100 MG PO TABS Oral Take 100 mg by mouth every 2 (two) hours as needed.      BP 125/70  Pulse 86  Temp 97.9 F (36.6 C)  Resp 16  SpO2 94%  Physical Exam  ED Course  Procedures (including critical care time)  Date: 03/22/2011  Rate: 82  Rhythm: normal sinus rhythm  QRS Axis: normal  Intervals: normal  ST/T Wave abnormalities: normal  Conduction Disutrbances:none  Narrative Interpretation: Cannot rule out anterior infarct, age undetermined  Old EKG Reviewed: unchanged   Labs Reviewed  COMPREHENSIVE METABOLIC PANEL - Abnormal; Notable for the following:    AST 52 (*)    Alkaline Phosphatase 123 (*)    GFR calc non Af Amer 84 (*)    All other components within normal limits  CBC  D-DIMER, QUANTITATIVE  POCT I-STAT TROPONIN I  TROPONIN I  Dg Chest 2 View  03/22/2011  *RADIOLOGY REPORT*  Clinical Data: Mid chest pain; headache and lightheadedness.  CHEST - 2 VIEW  Comparison: CTA of the chest performed 10/24/2008, and chest radiograph performed 01/29/2005  Findings: The lungs are well-aerated.  Mild vascular congestion is noted.  There is no evidence of focal opacification, pleural effusion or pneumothorax.  The heart is normal in size; the mediastinal contour is within normal limits.  No acute osseous abnormalities are seen.  Clips are noted within the right upper quadrant, reflecting prior cholecystectomy.  IMPRESSION: Mild vascular congestion noted; lungs remain grossly clear.  Original Report Authenticated By: Tonia Ghent, M.D.     MDM  Old records have been reviewed. Patient had a cardiac catheterization in 2010 that showed smooth normal coronary arteries.  Patient has noted improvement with treatment. She has had some persistent nausea  but is not having any abdominal pain or vomiting. I doubt cardiac etiology with her normal cardiac catheterization in 2010 and normal studies here in the emergency department. Is not having any EKG changes. Patient is at low risk for pulmonary embolism and with her negative d-dimer and feel it is unlikely that that is the etiology. Her abdomen exam is benign I doubt any signs of bowel obstruction. This time there does not appear to be evidence of an acute emergency medical condition. Patient discharged home with medications for nausea and antacids  12:28 AM Pt is still having some nausea.  Suspect she may be having some esophageal spasms with the vomiting.  No abdominal ttp.    Pt feeling better at this time.   Ready for discharge.  1:04 AM        Celene Kras, MD 03/23/11 Jacinta Shoe  Celene Kras, MD 03/23/11 [phone]

## 2011-03-22 NOTE — Telephone Encounter (Signed)
Patient called this evening to report an episode of chest pain she felt was worrisome for coronary vasospasm. 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 She has a history of migraines and had taken 2 doses of sumatriptan over the course of this morning. This afternoon she began to have waxing/waning chest discomfort she felt was most likely spasm. She continues to feel strangely with some associated general fatigue. She is not sure if these symptoms are from her migraines or if she is having coronary chest pain. She called for reassurance that this does not require any further workup but unfortunately given lack of ability to evaluate in person, this was not possible. I advised her that if these symptoms are different than her usual migraines or if she is worried, then she should proceed to ER for further evaluation. She verbalized understanding and gratitude.

## 2011-03-23 MED ORDER — METOCLOPRAMIDE HCL 5 MG/ML IJ SOLN
10.0000 mg | Freq: Once | INTRAMUSCULAR | Status: AC
  Administered 2011-03-23: 10 mg via INTRAVENOUS

## 2011-03-23 MED ORDER — METOCLOPRAMIDE HCL 10 MG PO TABS: 10.0000 mg | ORAL_TABLET | Freq: Four times a day (QID) | ORAL | Status: DC

## 2011-03-23 MED ORDER — ONDANSETRON 8 MG PO TBDP: 8.0000 mg | ORAL_TABLET | Freq: Three times a day (TID) | ORAL | Status: AC | PRN

## 2011-03-23 MED ORDER — METOCLOPRAMIDE HCL 5 MG/ML IJ SOLN
INTRAMUSCULAR | Status: AC
  Filled 2011-03-23: qty 2

## 2011-03-23 MED ORDER — ONDANSETRON 8 MG PO TBDP: 8.0000 mg | ORAL_TABLET | Freq: Three times a day (TID) | ORAL | Status: DC | PRN

## 2011-03-23 MED ORDER — METOCLOPRAMIDE HCL 10 MG PO TABS: 10.0000 mg | ORAL_TABLET | Freq: Four times a day (QID) | ORAL | Status: AC

## 2011-03-26 ENCOUNTER — Other Ambulatory Visit: Payer: Self-pay | Admitting: Cardiovascular Disease

## 2011-07-26 ENCOUNTER — Emergency Department (HOSPITAL_COMMUNITY)
Admission: EM | Admit: 2011-07-26 | Discharge: 2011-07-26 | Disposition: A | Discharge status: Home / Self Care | Payer: Commercial Managed Care - PPO | Source: Home / Self Care | Attending: Emergency Medicine | Admitting: Emergency Medicine

## 2011-07-26 ENCOUNTER — Emergency Department (INDEPENDENT_AMBULATORY_CARE_PROVIDER_SITE_OTHER): Payer: Commercial Managed Care - PPO

## 2011-07-26 ENCOUNTER — Encounter (HOSPITAL_COMMUNITY): Payer: Self-pay

## 2011-07-26 DIAGNOSIS — S60219A Contusion of unspecified wrist, initial encounter: Secondary | ICD-10-CM

## 2011-07-26 DIAGNOSIS — S60211A Contusion of right wrist, initial encounter: Secondary | ICD-10-CM

## 2011-07-26 HISTORY — DX: Bladder disorder, unspecified: N32.9

## 2011-07-26 NOTE — Discharge Instructions (Signed)

## 2011-07-26 NOTE — ED Notes (Signed)
Pt states she tripped over furniture today and caught rt wrist in doorway, c/o pain and swelling

## 2011-07-26 NOTE — ED Provider Notes (Signed)
Chief Complaint  Patient presents with  . Wrist Pain    rt wrist injury    History of Present Illness:   The patient is a 46 year old female who injured her right wrist today. She fell to indoor and caught her weight on her outstretched right hand. Ever since then she's had pain in the right wrist over the ulnar styloid. There is no swelling, bruising, or deformity. She able to move her wrist through full range of motion with minimal pain. There is no numbness or tingling.  Review of Systems:  Other than noted above, the patient denies any of the following symptoms: Systemic:  No fevers, chills, sweats, or aches.  No fatigue or tiredness. Musculoskeletal:  No joint pain, arthritis, bursitis, swelling, back pain, or neck pain. Neurological:  No muscular weakness, paresthesias, headache, or trouble with speech or coordination.  No dizziness.   PMFSH:  Past medical history, family history, social history, meds, and allergies were reviewed.  Physical Exam:   Vital signs:  BP 155/85  Pulse 90  Temp 98.4 F (36.9 C) (Oral)  Resp 18  SpO2 100% Gen:  Alert and oriented times 3.  In no distress. Musculoskeletal: Exam the wrist reveals no swelling, bruising, or deformity. She has slight pain to palpation of the ulnar styloid. Wrist has a full range of motion with minimal pain at the endpoints of movement. There was no pain to palpation of the anatomical snuff box area. Otherwise, all joints had a full a ROM with no swelling, bruising or deformity.  No edema, pulses full. Extremities were warm and pink.  Capillary refill was brisk.  Skin:  Clear, warm and dry.  No rash. Neuro:  Alert and oriented times 3.  Muscle strength was normal.  Sensation was intact to light touch.   Radiology:  Dg Wrist Complete Right  07/26/2011  *RADIOLOGY REPORT*  Clinical Data: Larey Seat and injured right wrist.  RIGHT WRIST - COMPLETE 3+ VIEW  Comparison: None.  Findings: No evidence of acute fracture or dislocation.  Mild  radiocarpal joint space narrowing.  Remaining joint spaces well preserved.  IMPRESSION: No acute osseous abnormality.  Mild degenerative changes involving the radiocarpal joint.  Original Report Authenticated By: Arnell Sieving, M.D.    Assessment:  The encounter diagnosis was Contusion of right wrist.  Plan:   1.  The following meds were prescribed:   New Prescriptions   No medications on file   2.  The patient was instructed in symptomatic care, including rest and activity, elevation, application of ice and compression.  Appropriate handouts were given. 3.  The patient was told to return if becoming worse in any way, if no better in 3 or 4 days, and given some red flag symptoms that would indicate earlier return.   4.  The patient was told to follow up here if no better in 2 weeks.   Reuben Likes, MD 07/26/11 [phone]

## 2011-10-08 ENCOUNTER — Encounter (INDEPENDENT_AMBULATORY_CARE_PROVIDER_SITE_OTHER): Payer: Self-pay | Admitting: General Surgery

## 2011-10-08 ENCOUNTER — Ambulatory Visit (INDEPENDENT_AMBULATORY_CARE_PROVIDER_SITE_OTHER): Payer: Commercial Managed Care - PPO | Admitting: General Surgery

## 2011-10-08 VITALS — BP 122/76 | HR 104 | Temp 98.0°F | Resp 18 | Ht 68.0 in | Wt 211.0 lb

## 2011-10-08 DIAGNOSIS — K645 Perianal venous thrombosis: Secondary | ICD-10-CM

## 2011-10-08 NOTE — Progress Notes (Signed)
Subjective:     Patient ID: Cynthia [name], female   DOB: 1965/12/28, 46 y.o.   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  She has had some straining and hard bms.  About five days ago she was noted to have rectal pain and has had some bleeding.  She notes a mass to be present also.  She is not having many bms now.  She comes in today to discuss what this is.  It has been present since Saturday.  She has attempted some otc creams.   She does have history of polyps on colonoscopy and was recommended a three year follow up which would be in the next year.  Review of Systems     Objective:   Physical Exam Left lateral external thrombosed hemorrhoid    Assessment:     Thrombosed external hemorrhoid    Plan:     This is 5 days out at this point.  I think continuing with conservative therapy at this point is best. We discussed sitz baths, continuing otc creams, miralax, stool softeners.  Will see back if needed. I also gave her names of several GI physicians to get her colonoscopy when she is due.

## 2011-10-14 ENCOUNTER — Other Ambulatory Visit: Payer: Self-pay | Admitting: Internal Medicine

## 2011-10-14 ENCOUNTER — Encounter: Payer: Self-pay | Admitting: Internal Medicine

## 2011-10-14 DIAGNOSIS — Z1231 Encounter for screening mammogram for malignant neoplasm of breast: Secondary | ICD-10-CM

## 2011-11-12 ENCOUNTER — Ambulatory Visit
Admission: RE | Admit: 2011-11-12 | Discharge: 2011-11-12 | Disposition: A | Discharge status: Home / Self Care | Payer: 59 | Source: Ambulatory Visit | Attending: Internal Medicine | Admitting: Internal Medicine

## 2011-11-12 DIAGNOSIS — Z1231 Encounter for screening mammogram for malignant neoplasm of breast: Secondary | ICD-10-CM

## 2011-11-19 ENCOUNTER — Ambulatory Visit: Payer: Commercial Managed Care - PPO | Admitting: Internal Medicine

## 2011-12-04 ENCOUNTER — Other Ambulatory Visit (HOSPITAL_COMMUNITY): Payer: Self-pay | Admitting: Otolaryngology

## 2011-12-04 DIAGNOSIS — H93A9 Pulsatile tinnitus, unspecified ear: Secondary | ICD-10-CM

## 2011-12-05 ENCOUNTER — Ambulatory Visit (HOSPITAL_COMMUNITY): Payer: 59

## 2011-12-05 ENCOUNTER — Ambulatory Visit (HOSPITAL_COMMUNITY)
Admission: RE | Admit: 2011-12-05 | Discharge: 2011-12-05 | Disposition: A | Discharge status: Home / Self Care | Payer: 59 | Source: Ambulatory Visit | Attending: Otolaryngology | Admitting: Otolaryngology

## 2011-12-05 DIAGNOSIS — H9319 Tinnitus, unspecified ear: Secondary | ICD-10-CM | POA: Insufficient documentation

## 2011-12-05 DIAGNOSIS — H93A9 Pulsatile tinnitus, unspecified ear: Secondary | ICD-10-CM

## 2011-12-08 ENCOUNTER — Ambulatory Visit (HOSPITAL_COMMUNITY): Payer: 59

## 2011-12-08 ENCOUNTER — Ambulatory Visit (HOSPITAL_COMMUNITY): Admission: RE | Admit: 2011-12-08 | Payer: 59 | Source: Ambulatory Visit

## 2012-01-06 ENCOUNTER — Encounter: Payer: Self-pay | Admitting: Internal Medicine

## 2012-01-06 ENCOUNTER — Ambulatory Visit (INDEPENDENT_AMBULATORY_CARE_PROVIDER_SITE_OTHER): Payer: 59 | Admitting: Internal Medicine

## 2012-01-06 ENCOUNTER — Other Ambulatory Visit (INDEPENDENT_AMBULATORY_CARE_PROVIDER_SITE_OTHER): Payer: 59

## 2012-01-06 VITALS — BP 102/60 | HR 92 | Ht 67.25 in | Wt 208.1 lb

## 2012-01-06 DIAGNOSIS — R198 Other specified symptoms and signs involving the digestive system and abdomen: Secondary | ICD-10-CM

## 2012-01-06 DIAGNOSIS — R634 Abnormal weight loss: Secondary | ICD-10-CM

## 2012-01-06 DIAGNOSIS — R109 Unspecified abdominal pain: Secondary | ICD-10-CM

## 2012-01-06 DIAGNOSIS — Z86010 Personal history of colonic polyps: Secondary | ICD-10-CM

## 2012-01-06 LAB — IGA: IgA: 179 mg/dL (ref 68–378)

## 2012-01-06 MED ORDER — MOVIPREP 100 G PO SOLR: 1.0000 | Freq: Once | ORAL | Status: DC

## 2012-01-06 NOTE — Patient Instructions (Addendum)
Your physician has requested that you go to the basement for lab work before leaving today   You have been scheduled for an endoscopy and colonoscopy with propofol. Please follow the written instructions given to you at your visit today. Please pick up your prep at the pharmacy within the next 1-3 days. If you use inhalers (even only as needed) or a CPAP machine, please bring them with you on the day of your procedure.  You may use Miralax, titrate to need, for constipation

## 2012-01-06 NOTE — Progress Notes (Addendum)
HISTORY OF PRESENT ILLNESS:  Cynthia [name] is a 46 y.o. 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 She presents today for evaluation of change in bowel habits as manifested by constipation and unexplained weight loss. She is status post hysterectomy with bilateral salpingo-oophorectomy in 2004. She is status post laparoscopic cholecystectomy with negative intraoperative cholangiogram in 2005. Pathology revealed mild chronic cholecystitis without stones. Review of the electronic record reveals multiple CT scans of the abdomen and pelvis to evaluate right lower quadrant pain. CT scan was performed in 2004, 2005, 2006 x2 and 2010 were all unremarkable. A scan in 2008 suggested mild sigmoid stranding, possibly diverticulitis. The patient did undergo complete colonoscopy 03/09/2009, in [facility] (outside records requested and subsequently received and reviewed). The examination was complete to the cecum with excellent preparation. The colonic mucosa was said to looked normal throughout. 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 Random colon biopsies revealed changes consistent with colonic preparation, but no evidence for colitis. Followup in 3 years recommended. Her current history is that of constipation which began this year. Previously, 2 bowel movements per week without issues her symptoms. Currently with the same bowel movement frequency, though more difficult. She has used Software engineer which were somewhat helpful. She strains and changes body position (tilt forward, lean backward) to help with defecation. She denies bleeding. Other issues this year have included Pseudomonas ear infection, undefined skin rash, vitamin D deficiency, and thrombosed hemorrhoids treated medically. Next, she reports  30-35 pound weight loss since April. This despite no significant change in diet or activity. Some decreased appetite, however. No nausea or vomiting. Some lower abdominal discomfort this past week, since returning from a cruise. Reviewing documented weights available, 227 pounds in December 2010; 234 pounds in March 2012; 211 pounds October 08, 2011; and 208 pounds today. Review of additional outside records from 10 days ago reveals essentially normal comprehensive metabolic panel, CBC, and thyroid studies. As well, unremarkable urinalysis and cortisol. Sedimentation rate of 26 and vitamin D level of 12 (greater than 30).  REVIEW OF SYSTEMS:  All non-GI ROS negative except for skin rash, voice change, vision change, fatigue  Past Medical History  Diagnosis Date  . Diastolic congestive heart failure   . Hyperlipemia   . Hypothyroidism   . History of depression   . History of migraines   . Rosacea   . Anxiety   . Pericarditis   . Bladder disorder   . GERD (gastroesophageal reflux disease)   . Diverticulosis   . Colitis 2006  . Vitamin D deficiency     Past Surgical History  Procedure Date  . Total abdominal hysterectomy w/ bilateral salpingoophorectomy   . Cholecystectomy   . Cardiac catheterization 11/15/08    SMOOTH AND NORMAL  . Shoulder surgery     left  . Laparoscopy   . Polypectomy 02/2009    Social History Cynthia [name]  reports that she has never smoked. She has never used smokeless tobacco. She reports that she does not drink alcohol or use illicit drugs.  family history is not on file.  She is adopted.  Allergies  Allergen Reactions  . Ampicillin     SERUM SICKNESS REACTION       PHYSICAL EXAMINATION: Vital signs: BP 102/60  Pulse 92  Ht 5' 7.25" (1.708 m)  Wt  208 lb 2 oz (94.405 kg)  BMI 32.36 kg/m2  Constitutional: generally well-appearing, no acute distress Psychiatric: alert and oriented x3, cooperative Eyes: extraocular movements intact, anicteric,  conjunctiva pink Mouth: oral pharynx moist, no lesions Neck: supple no lymphadenopathy Cardiovascular: heart regular rate and rhythm, no murmur Lungs: clear to auscultation bilaterally Abdomen: soft, nontender, nondistended, no obvious ascites, no peritoneal signs, normal bowel sounds, no organomegaly Rectal:deferred until colonoscopy Extremities: no lower extremity edema bilaterally Skin: no lesions on visible extremities Neuro: No focal deficits.  ASSESSMENT:  #1. Change in bowel habits as manifested by obstipation #2. Weight loss. Reports of 30-35 pounds in 8 months. 3 pounds in the past 3 months documented #3. Chronic right lower quadrant pain with multiple negative CT scan examinations (save mild sigmoid stranding 2008) #4. Status post hysterectomy with bilateral salpingo-oophorectomy #5. Status post laparoscopic cholecystectomy (non-calculus) with negative IOC #6. 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 Routine followup recommended February 2014 #7. Vitamin D deficiency   PLAN:  #1. MiraLax. Titrate to need #2. Colonoscopy to provide colorectal neoplasia surveillance (now due) and evaluate obstipation and unexplained weight loss.The nature of the procedure, as well as the risks, benefits, and alternatives were carefully and thoroughly reviewed with the patient. Ample time for discussion and questions allowed. The patient understood, was satisfied, and agreed to proceed. Movi prep prescribed. The patient instructed on its use #3. Tissue transglutaminase antibody and serum IgA #4. Upper endoscopy with small bowel biopsies.The nature of the procedure, as well as the risks, benefits, and alternatives were carefully and thoroughly reviewed with the patient. Ample time for discussion and questions allowed. The patient understood, was satisfied, and agreed to proceed.  #5. Could consider advanced imaging, pending the above results

## 2012-01-08 ENCOUNTER — Ambulatory Visit (AMBULATORY_SURGERY_CENTER): Payer: 59 | Admitting: Internal Medicine

## 2012-01-08 ENCOUNTER — Encounter: Payer: Self-pay | Admitting: Internal Medicine

## 2012-01-08 VITALS — BP 116/72 | HR 71 | Temp 98.9°F | Resp 9 | Ht 67.0 in | Wt 208.0 lb

## 2012-01-08 DIAGNOSIS — D126 Benign neoplasm of colon, unspecified: Secondary | ICD-10-CM

## 2012-01-08 DIAGNOSIS — R198 Other specified symptoms and signs involving the digestive system and abdomen: Secondary | ICD-10-CM

## 2012-01-08 DIAGNOSIS — R634 Abnormal weight loss: Secondary | ICD-10-CM

## 2012-01-08 DIAGNOSIS — R109 Unspecified abdominal pain: Secondary | ICD-10-CM

## 2012-01-08 DIAGNOSIS — Z1211 Encounter for screening for malignant neoplasm of colon: Secondary | ICD-10-CM

## 2012-01-08 DIAGNOSIS — Z86010 Personal history of colonic polyps: Secondary | ICD-10-CM

## 2012-01-08 DIAGNOSIS — K635 Polyp of colon: Secondary | ICD-10-CM

## 2012-01-08 MED ORDER — SODIUM CHLORIDE 0.9 % IV SOLN: 500.0000 mL | INTRAVENOUS | Status: DC

## 2012-01-08 NOTE — Patient Instructions (Addendum)
YOU HAD AN ENDOSCOPIC PROCEDURE TODAY AT THE [facility]: Refer to the procedure report that was given to you for any specific questions about what was found during the examination.  If the procedure report does not answer your questions, please call your gastroenterologist to clarify.  If you requested that your care partner not be given the details of your procedure findings, then the procedure report has been included in a sealed envelope for you to review at your convenience later.  YOU SHOULD EXPECT: Some feelings of bloating in the abdomen. Passage of more gas than usual.  Walking can help get rid of the air that was put into your GI tract during the procedure and reduce the bloating. If you had a lower endoscopy (such as a colonoscopy or flexible sigmoidoscopy) you may notice spotting of blood in your stool or on the toilet paper. If you underwent a bowel prep for your procedure, then you may not have a normal bowel movement for a few days.  DIET: Your first meal following the procedure should be a light meal and then it is ok to progress to your normal diet.  A half-sandwich or bowl of soup is an example of a good first meal.  Heavy or fried foods are harder to digest and may make you feel nauseous or bloated.  Likewise meals heavy in dairy and vegetables can cause extra gas to form and this can also increase the bloating.  Drink plenty of fluids but you should avoid alcoholic beverages for 24 hours.  ACTIVITY: Your care partner should take you home directly after the procedure.  You should plan to take it easy, moving slowly for the rest of the day.  You can resume normal activity the day after the procedure however you should NOT DRIVE or use heavy machinery for 24 hours (because of the sedation medicines used during the test).    SYMPTOMS TO REPORT IMMEDIATELY: A gastroenterologist can be reached at any hour.  During normal business hours, 8:30 AM to 5:00 PM Monday through Friday,  call [phone].  After hours and on weekends, please call the GI answering service at [phone] who will take a message and have the physician on call contact you.   Following lower endoscopy (colonoscopy or flexible sigmoidoscopy):  Excessive amounts of blood in the stool  Significant tenderness or worsening of abdominal pains  Swelling of the abdomen that is new, acute  Fever of 100F or higher  Following upper endoscopy (EGD)  Vomiting of blood or coffee ground material  New chest pain or pain under the shoulder blades  Painful or persistently difficult swallowing  New shortness of breath  Fever of 100F or higher  Black, tarry-looking stools  FOLLOW UP: If any biopsies were taken you will be contacted by phone or by letter within the next 1-3 weeks.  Call your gastroenterologist if you have not heard about the biopsies in 3 weeks.  Our staff will call the home number listed on your records the next business day following your procedure to check on you and address any questions or concerns that you may have at that time regarding the information given to you following your procedure. This is a courtesy call and so if there is no answer at the home number and we have not heard from you through the emergency physician on call, we will assume that you have returned to your regular daily activities without incident.  SIGNATURES/CONFIDENTIALITY: You and/or your care   partner have signed paperwork which will be entered into your electronic medical record.  These signatures attest to the fact that that the information above on your After Visit Summary has been reviewed and is understood.  Full responsibility of the confidentiality of this discharge information lies with you and/or your care-partner.   Thank-you for choosing us for your healthcare needs. 

## 2012-01-08 NOTE — Progress Notes (Addendum)
Patient did not have preoperative order for IV antibiotic SSI prophylaxis. (G8918)  Patient did not experience any of the following events: a burn prior to discharge; a fall within the facility; wrong site/side/patient/procedure/implant event; or a hospital transfer or hospital admission upon discharge from the facility. (G8907)  

## 2012-01-08 NOTE — Op Note (Signed)
[facility] [address] [place], [address]   COLONOSCOPY PROCEDURE REPORT  PATIENT: Cynthia, [name]  MR#: [id number] BIRTHDATE: 11/07/1965 , 46  yrs. old GENDER: Female ENDOSCOPIST: Roxy Cedar, MD REFERRED BY:R.  Robley Fries, M.D. PROCEDURE DATE:  01/08/2012 PROCEDURE:   Colonoscopy with snare polypectomy    x 2 ASA CLASS:   Class II INDICATIONS:Patient's personal history of adenomatous colon polyps. Index ([place]) 03-2009 w/ TVA MEDICATIONS: MAC sedation, administered by CRNA and propofol (Diprivan) 400mg  IV  DESCRIPTION OF PROCEDURE:   After the risks benefits and alternatives of the procedure were thoroughly explained, informed consent was obtained.  A digital rectal exam revealed no abnormalities of the rectum.   The LB CF-H180AL [id number]  endoscope was introduced through the anus and advanced to the cecum, which was identified by both the appendix and ileocecal valve. No adverse events experienced.   The quality of the prep was excellent, using MoviPrep  The instrument was then slowly withdrawn as the colon was fully examined.      COLON FINDINGS: Two polyps ranging between 3-54mm in size were found in the transverse colon and sigmoid colon.  A polypectomy was performed with a cold snare.  The resection was complete and the polyp tissue was completely retrieved.   The mucosa appeared normal in the terminal ileum.   The colon was otherwise normal.  There was no diverticulosis, inflammation, other polyps or cancers . Retroflexed views revealed small internal hemorrhoids. The time to cecum=2 minutes 13 seconds.  Withdrawal time=14 minutes 35 seconds. The scope was withdrawn and the procedure completed. COMPLICATIONS: There were no complications.  ENDOSCOPIC IMPRESSION: 1.   Two polyps ranging between 3-74mm in size were found in the transverse colon and sigmoid colon; polypectomy was performed with a cold snare 2.   Normal mucosa in the  terminal ileum 3.   The colon was otherwise normal  RECOMMENDATIONS: 1.  Follow up colonoscopy in 5 years 2.  Upper endoscopy today (see report)   eSigned:  Roxy Cedar, MD 01/08/2012 10:21 AM cc: R.  Robley Fries, MD and The Patient   PATIENT NAME:  Cynthia, [name] MR#: [id number]

## 2012-01-08 NOTE — Op Note (Signed)
[facility] [address] [place], [address]   ENDOSCOPY PROCEDURE REPORT  PATIENT: Cynthia [name], Cynthia [name]  MR#: [id number] BIRTHDATE: 1965/10/21 , 46  yrs. old GENDER: Female ENDOSCOPIST: Roxy Cedar, MD REFERRED BY:  R.  Robley Fries, M.D. PROCEDURE DATE:  01/08/2012 PROCEDURE:  EGD, diagnostic ASA CLASS:     Class II INDICATIONS:  Weight loss.   abdominal pain. MEDICATIONS: MAC sedation, administered by CRNA and propofol (Diprivan) 150mg  IV TOPICAL ANESTHETIC: none  DESCRIPTION OF PROCEDURE: After the risks benefits and alternatives of the procedure were thoroughly explained, informed consent was obtained.  The LB GIF-H180 [id number] endoscope was introduced through the mouth and advanced to the third portion of the duodenum. Without limitations.  The instrument was slowly withdrawn as the mucosa was fully examined.      The upper, middle and distal third of the esophagus were carefully inspected and no abnormalities were noted.  The z-line was well seen at the [facility] and revealed mild esophagitis.  The endoscope was pushed into the fundus which was normal including a retroflexed view.  The antrum, gastric body, first and second part of the duodenum were unremarkable.  Retroflexed views revealed no abnormalities.     The scope was then withdrawn from the patient and the procedure completed.  COMPLICATIONS: There were no complications. ENDOSCOPIC IMPRESSION: 1. Mild esophagitis 2. Otherwise Normal EGD  RECOMMENDATIONS: 1.  Anti-reflux regimen to be followed 2.  PPI qam as needed for reflux symptoms 3.  Miralax daily as needed for constipation 3.  OP follow-up in 4 weeks.  REPEAT EXAM:  eSigned:  Roxy Cedar, MD 01/08/2012 10:29 AM  CC:R.  Robley Fries, MD and The Patient

## 2012-01-09 ENCOUNTER — Telehealth: Payer: Self-pay | Admitting: *Deleted

## 2012-01-09 NOTE — Telephone Encounter (Signed)
  Follow up Call-  Call back number 01/08/2012  Post procedure Call Back phone  # [phone]  Permission to leave phone message Yes     Patient questions:  Do you have a fever, pain , or abdominal swelling? no Pain Score  0 *  Have you tolerated food without any problems? yes  Have you been able to return to your normal activities? yes  Do you have any questions about your discharge instructions: Diet   no Medications  no Follow up visit  no  Do you have questions or concerns about your Care? no  Actions: * If pain score is 4 or above: No action needed, pain <4.

## 2012-01-13 ENCOUNTER — Encounter: Payer: Self-pay | Admitting: Internal Medicine

## 2012-02-09 ENCOUNTER — Ambulatory Visit (INDEPENDENT_AMBULATORY_CARE_PROVIDER_SITE_OTHER): Payer: 59 | Admitting: Internal Medicine

## 2012-02-09 ENCOUNTER — Encounter: Payer: Self-pay | Admitting: Internal Medicine

## 2012-02-09 VITALS — BP 124/72 | HR 98 | Ht 67.25 in | Wt 207.0 lb

## 2012-02-09 DIAGNOSIS — Z860100 Personal history of colon polyps, unspecified: Secondary | ICD-10-CM

## 2012-02-09 DIAGNOSIS — K5900 Constipation, unspecified: Secondary | ICD-10-CM

## 2012-02-09 DIAGNOSIS — K219 Gastro-esophageal reflux disease without esophagitis: Secondary | ICD-10-CM

## 2012-02-09 DIAGNOSIS — R109 Unspecified abdominal pain: Secondary | ICD-10-CM

## 2012-02-09 NOTE — Patient Instructions (Addendum)
Please follow up as needed 

## 2012-02-09 NOTE — Progress Notes (Signed)
HISTORY OF PRESENT ILLNESS:  Cynthia [name] is a 47 y.o. female with a past medical history as outlined below. She was evaluated initially on 01/06/2012 regarding change in bowel habits as manifested by constipation, unexplained weight loss, abdominal discomfort, and surveillance colonoscopy disease history of tubulovillous adenoma February 2011, elsewhere). She subsequently underwent colonoscopy and upper endoscopy on 01/08/2012. Colonoscopy revealed 2 diminutive polyps (sessile serrated adenoma and hyperplastic polyp) but was otherwise normal, including intubation of the terminal ileum. Followup in 5 years recommended. Upper endoscopy revealed mild esophagitis, but was otherwise normal. On demand PPI and reflux precautions recommended. MiraLax for constipation. She presents today for followup. She reports she is feeling significantly better. MiraLax helps her bowels. Has been taking MiraLax once daily but is considering increasing the dosage. Tolerates it well. Her weight has been stable since her last visit. No new complaints. Reviewed her endoscopy reports and pathology.  REVIEW OF SYSTEMS:  All non-GI ROS negative except for voice change  Past Medical History  Diagnosis Date  . Diastolic congestive heart failure   . Hyperlipemia   . Hypothyroidism   . History of depression   . History of migraines   . Rosacea   . Anxiety   . Pericarditis   . Bladder disorder   . GERD (gastroesophageal reflux disease)   . Diverticulosis   . Colitis 2006  . Vitamin D deficiency   . Colon polyps     Past Surgical History  Procedure Date  . Total abdominal hysterectomy w/ bilateral salpingoophorectomy   . Cholecystectomy   . Cardiac catheterization 11/15/08    SMOOTH AND NORMAL  . Shoulder surgery     left  . Laparoscopy   . Polypectomy 02/2009    Social History Cynthia [name]  reports that she has never smoked. She has never used smokeless tobacco. She reports that she does not drink alcohol or  use illicit drugs.  family history is not on file.  She is adopted.  Allergies  Allergen Reactions  . Ampicillin     SERUM SICKNESS REACTION       PHYSICAL EXAMINATION: Vital signs: BP 124/72  Pulse 98  Ht 5' 7.25" (1.708 m)  Wt 207 lb (93.895 kg)  BMI 32.18 kg/m2 General: Well-developed, well-nourished, no acute distress HEENT: Sclerae are anicteric, conjunctiva pink. Oral mucosa intact Lungs: Clear Heart: Regular Abdomen: soft, nontender, nondistended, no obvious ascites, no peritoneal signs, normal bowel sounds. No organomegaly. Extremities: No edema Psychiatric: alert and oriented x3. Cooperative     ASSESSMENT:  #1. Constipation #2. Mild esophagitis on endoscopy. Asymptomatic #3. 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 History of recurrent abdominal pain status post laparoscopic hysterectomy with bilateral salpingo-oophorectomy 2004 and laparoscopic cholecystectomy with negative intraoperative cholangiogram in 2005  PLAN:  #1. MiraLax. Titrate to need #2. Reflux precautions. On demand H2 receptor antagonist therapy, and masses, or PPI if needed #3. Surveillance colonoscopy around December 2018. Interval followup as needed

## 2012-02-13 ENCOUNTER — Other Ambulatory Visit (HOSPITAL_COMMUNITY): Payer: Self-pay | Admitting: Otolaryngology

## 2012-02-13 DIAGNOSIS — H93A9 Pulsatile tinnitus, unspecified ear: Secondary | ICD-10-CM

## 2012-02-17 ENCOUNTER — Ambulatory Visit (HOSPITAL_COMMUNITY): Payer: 59

## 2012-02-19 ENCOUNTER — Ambulatory Visit (HOSPITAL_COMMUNITY): Payer: 59

## 2012-02-23 ENCOUNTER — Ambulatory Visit (HOSPITAL_COMMUNITY)
Admission: RE | Admit: 2012-02-23 | Discharge: 2012-02-23 | Disposition: A | Discharge status: Home / Self Care | Payer: 59 | Source: Ambulatory Visit | Attending: Otolaryngology | Admitting: Otolaryngology

## 2012-02-23 DIAGNOSIS — H93A9 Pulsatile tinnitus, unspecified ear: Secondary | ICD-10-CM

## 2012-02-23 DIAGNOSIS — H9319 Tinnitus, unspecified ear: Secondary | ICD-10-CM | POA: Insufficient documentation

## 2012-03-20 ENCOUNTER — Other Ambulatory Visit: Payer: Self-pay

## 2012-04-28 ENCOUNTER — Ambulatory Visit (INDEPENDENT_AMBULATORY_CARE_PROVIDER_SITE_OTHER): Payer: 59 | Admitting: *Deleted

## 2012-04-28 ENCOUNTER — Telehealth: Payer: Self-pay | Admitting: Diagnostic Neuroimaging

## 2012-04-28 DIAGNOSIS — G43909 Migraine, unspecified, not intractable, without status migrainosus: Secondary | ICD-10-CM

## 2012-04-28 MED ORDER — SODIUM CHLORIDE 0.9 % IV SOLN
500.0000 mL | INTRAVENOUS | Status: DC
  Administered 2012-04-28: 500 mL via INTRAVENOUS

## 2012-04-28 MED ORDER — VALPROATE SODIUM 500 MG/5ML IV SOLN
1000.0000 mg | INTRAVENOUS | Status: DC
  Administered 2012-04-28: 1000 mg via INTRAVENOUS

## 2012-04-28 MED ORDER — PREDNISONE (PAK) 10 MG PO TABS: 10.0000 mg | ORAL_TABLET | ORAL | Status: DC

## 2012-04-28 MED ORDER — VALPROATE SODIUM 100 MG/ML IV SOLN: 1000.0000 mg | INTRAVENOUS | Status: AC

## 2012-04-28 MED ORDER — PROMETHAZINE HCL 25 MG/ML IJ SOLN: 25.0000 mg | Freq: Once | INTRAMUSCULAR | Status: DC

## 2012-04-28 MED ORDER — SODIUM CHLORIDE 0.9 % IV SOLN
25.0000 mg | INTRAVENOUS | Status: AC
  Administered 2012-04-28: 25 mg via INTRAVENOUS

## 2012-04-28 MED ORDER — PROMETHAZINE HCL 25 MG PO TABS: 25.0000 mg | ORAL_TABLET | Freq: Four times a day (QID) | ORAL | Status: DC | PRN

## 2012-04-28 MED ORDER — ONDANSETRON 8 MG PO TBDP: 8.0000 mg | ORAL_TABLET | Freq: Three times a day (TID) | ORAL | Status: DC | PRN

## 2012-04-28 NOTE — Progress Notes (Signed)
Pt here for depacon infusion. Pt c/o last week of photophobia, this Friday with headache, back of head and behind R eye, N/v, dizziness.  Had taken Ultram., zofran, maxalt and is taking topamax 75mg  po daily.  Allergies PCN.  IV 24g inserted to R forearm (3rd attempt) with good blood return.  NS infusion started ( ) with phenergan 25mg  IVP. Depacon infusion 1000mg  given over , then 0.9%NS given over 35 minutes.   Tolerated well.   IV discontinued, pressure applied and bandaid applied.   Dr. Marjory Lies in to see pt.  She stated headache was better, although nausea still same.  Orders for Phenergan, Zofran, and Prednisone dose pak done to [facility] on [address]. Pt with husband to restroom then check out.  NAD.

## 2012-04-28 NOTE — Telephone Encounter (Signed)
Per Dr. Marjory Lies pt to arrive for depacon infusion.

## 2012-06-10 ENCOUNTER — Encounter: Payer: Self-pay | Admitting: Cardiovascular Disease

## 2012-06-21 ENCOUNTER — Ambulatory Visit: Payer: Self-pay

## 2012-06-21 ENCOUNTER — Other Ambulatory Visit: Payer: Self-pay | Admitting: Occupational Medicine

## 2012-06-21 DIAGNOSIS — R52 Pain, unspecified: Secondary | ICD-10-CM

## 2012-11-16 ENCOUNTER — Other Ambulatory Visit: Payer: Self-pay

## 2012-11-16 DIAGNOSIS — Z1231 Encounter for screening mammogram for malignant neoplasm of breast: Secondary | ICD-10-CM

## 2012-12-09 ENCOUNTER — Other Ambulatory Visit: Payer: Self-pay

## 2012-12-15 ENCOUNTER — Ambulatory Visit
Admission: RE | Admit: 2012-12-15 | Discharge: 2012-12-15 | Disposition: A | Discharge status: Home / Self Care | Payer: 59 | Source: Ambulatory Visit

## 2012-12-15 DIAGNOSIS — Z1231 Encounter for screening mammogram for malignant neoplasm of breast: Secondary | ICD-10-CM

## 2012-12-27 ENCOUNTER — Other Ambulatory Visit (HOSPITAL_COMMUNITY): Payer: Self-pay | Admitting: Specialist

## 2012-12-27 DIAGNOSIS — M25512 Pain in left shoulder: Secondary | ICD-10-CM

## 2012-12-29 ENCOUNTER — Ambulatory Visit (HOSPITAL_COMMUNITY)
Admission: RE | Admit: 2012-12-29 | Discharge: 2012-12-29 | Disposition: A | Discharge status: Home / Self Care | Payer: 59 | Source: Ambulatory Visit | Attending: Specialist | Admitting: Specialist

## 2012-12-29 DIAGNOSIS — M25519 Pain in unspecified shoulder: Secondary | ICD-10-CM | POA: Insufficient documentation

## 2012-12-29 DIAGNOSIS — M25512 Pain in left shoulder: Secondary | ICD-10-CM

## 2013-02-03 DIAGNOSIS — I472 Ventricular tachycardia, unspecified: Secondary | ICD-10-CM

## 2013-02-03 DIAGNOSIS — I4729 Other ventricular tachycardia: Secondary | ICD-10-CM

## 2013-02-03 DIAGNOSIS — I319 Disease of pericardium, unspecified: Secondary | ICD-10-CM

## 2013-02-03 HISTORY — DX: Ventricular tachycardia, unspecified: I47.20

## 2013-02-03 HISTORY — DX: Disease of pericardium, unspecified: I31.9

## 2013-02-03 HISTORY — DX: Ventricular tachycardia: I47.2

## 2013-02-03 HISTORY — DX: Other ventricular tachycardia: I47.29

## 2013-04-05 ENCOUNTER — Ambulatory Visit: Payer: 59 | Admitting: Diagnostic Neuroimaging

## 2013-04-07 ENCOUNTER — Other Ambulatory Visit: Payer: Self-pay | Admitting: Internal Medicine

## 2013-04-07 ENCOUNTER — Ambulatory Visit
Admission: RE | Admit: 2013-04-07 | Discharge: 2013-04-07 | Disposition: A | Discharge status: Home / Self Care | Payer: 59 | Source: Ambulatory Visit | Attending: Internal Medicine | Admitting: Internal Medicine

## 2013-04-07 DIAGNOSIS — R109 Unspecified abdominal pain: Secondary | ICD-10-CM

## 2013-04-27 ENCOUNTER — Ambulatory Visit
Admission: RE | Admit: 2013-04-27 | Discharge: 2013-04-27 | Disposition: A | Discharge status: Home / Self Care | Payer: 59 | Source: Ambulatory Visit | Attending: Internal Medicine | Admitting: Internal Medicine

## 2013-04-27 ENCOUNTER — Other Ambulatory Visit: Payer: Self-pay | Admitting: Internal Medicine

## 2013-04-27 DIAGNOSIS — R7612 Nonspecific reaction to cell mediated immunity measurement of gamma interferon antigen response without active tuberculosis: Secondary | ICD-10-CM

## 2013-06-28 ENCOUNTER — Telehealth: Payer: Self-pay | Admitting: Cardiovascular Disease

## 2013-06-28 NOTE — Telephone Encounter (Signed)
Phone call from [place],  She has been having pleuretic CP, worse with lying down, Better with sitting up.  temporarirly better with Indocin.  ? Pericardial pain  Also has been having some tachycardia - Hr 155 one AM for 1-2 hours.  Will have her worked in on Thursday at 7:45. Will need ECG  She was told to arrive at 7:30.   Thayer Headings, Brooke Bonito., MD, [facility] 06/28/2013, 12:52 PM [address]  [facility] Pager [phone]

## 2013-06-30 ENCOUNTER — Ambulatory Visit (INDEPENDENT_AMBULATORY_CARE_PROVIDER_SITE_OTHER): Payer: 59 | Admitting: Cardiovascular Disease

## 2013-06-30 ENCOUNTER — Encounter: Payer: Self-pay | Admitting: Cardiovascular Disease

## 2013-06-30 VITALS — BP 115/73 | HR 109 | Ht 67.25 in | Wt 223.1 lb

## 2013-06-30 DIAGNOSIS — R079 Chest pain, unspecified: Secondary | ICD-10-CM

## 2013-06-30 DIAGNOSIS — R000 Tachycardia, unspecified: Secondary | ICD-10-CM

## 2013-06-30 DIAGNOSIS — I309 Acute pericarditis, unspecified: Secondary | ICD-10-CM

## 2013-06-30 MED ORDER — COLCHICINE 0.6 MG PO TABS: 0.6000 mg | ORAL_TABLET | Freq: Two times a day (BID) | ORAL | Status: DC | PRN

## 2013-06-30 MED ORDER — METOPROLOL TARTRATE 25 MG PO TABS: 25.0000 mg | ORAL_TABLET | Freq: Two times a day (BID) | ORAL | Status: DC

## 2013-06-30 NOTE — Progress Notes (Signed)
Cynthia [name] [id number] 1965/06/01  Problem List:  1. Tachycardia 2. Diastolic dysfunction 3. Chest pain - ? Pericarditis, smooth and normal cors by cath  (11/15/08) 4. Hyperlipidemia 5. Hypertension 6. Obesity 7. Hypothyroidism 8. Hx of Fiths' disease    History of Present Illness:  Cynthia [name] is a middle-aged female with the history of pericardial like pleuritic chest pain, diastolic congestive heart failure, hyperlipidemia, obesity, and hypertension. She also has a history of hypothyroidism is on Synthroid replacement. She has had significant pleuritic chest pain for the past several weeks. She's been on Indocin and it seems to be helping.   In the past we have performed a heart catheterization on her which revealed smooth and normal coronary arteries. She has had a cardiopulmonary stress test which revealed no significant cardiac abnormalities.  Jun 30, 2013:  Anshi called this week with episodes of chest pain and tachycardia. Her chest pains sounded pleuritic. She has gotten some relief with Indocin but the pain keeps returning.   Current Outpatient Prescriptions on File Prior to Visit  Medication Sig Dispense Refill  . ALPRAZolam (XANAX) 0.5 MG tablet Take 0.5 mg by mouth daily as needed. For anxiety      . doxazosin (CARDURA) 4 MG tablet Take 2 mg by mouth at bedtime.       . Est Estrogens-Methyltest (ESTRATEST PO) Take by mouth daily.        Marland Kitchen levothyroxine (SYNTHROID, LEVOTHROID) 75 MCG tablet Take 75 mcg by mouth daily.        . ondansetron (ZOFRAN ODT) 8 MG disintegrating tablet Take 1 tablet (8 mg total) by mouth every 8 (eight) hours as needed for nausea.  30 tablet  0  . topiramate (TOPAMAX) 25 MG tablet Take 75 mg by mouth at bedtime as needed.      . rizatriptan (MAXALT) 10 MG tablet Take 10 mg by mouth as needed. May repeat in 2 hours if needed       Current Facility-Administered Medications on File Prior to Visit  Medication Dose Route Frequency Provider Last Rate  Last Dose  . 0.9 %  sodium chloride infusion  500 mL Intravenous Continuous Penni Bombard, MD 500 mL/hr at 04/28/12 1156 500 mL at 04/28/12 1156  . promethazine (PHENERGAN) injection 25 mg  25 mg Intravenous Once Penni Bombard, MD      . valproate (DEPACON) 1,000 mg in sodium chloride 0.9 % 100 mL IVPB  1,000 mg Intravenous Continuous Penni Bombard, MD 110 mL/hr at 04/28/12 1204 1,000 mg at 04/28/12 1204    Allergies  Allergen Reactions  . Ampicillin     SERUM SICKNESS REACTION  . Imitrex [Sumatriptan]   . Prednisone   . Zoloft [Sertraline Hcl]     Past Medical History  Diagnosis Date  . Diastolic congestive heart failure   . Hyperlipemia   . Hypothyroidism   . History of depression   . History of migraines   . Rosacea   . Anxiety   . Pericarditis   . Bladder disorder   . GERD (gastroesophageal reflux disease)   . Diverticulosis   . Colitis 2006  . Vitamin D deficiency   . Colon polyps     Past Surgical History  Procedure Laterality Date  . Total abdominal hysterectomy w/ bilateral salpingoophorectomy    . Cholecystectomy    . Cardiac catheterization  11/15/08    SMOOTH AND NORMAL  . Shoulder surgery      left  . Laparoscopy    .  Polypectomy  02/2009    History  Smoking status  . Never Smoker   Smokeless tobacco  . Never Used    History  Alcohol Use No    Family History  Problem Relation Age of Onset  . Adopted: Yes    Reviw of Systems:  The patient denies any heat or cold intolerance.  No weight gain or weight loss.  The patient denies headaches or blurry vision.  There is no cough or sputum production.  The patient denies dizziness.  There is no hematuria or hematochezia.  The patient denies any muscle aches or arthritis.  The patient denies any rash.  The patient denies frequent falling or instability.  There is no history of depression or anxiety.  All other systems were reviewed and are negative.  Physical Exam: BP 115/73  Pulse 109   Ht 5' 7.25" (1.708 m)  Wt 223 lb 1.9 oz (101.207 kg)  BMI 34.69 kg/m2 The patient is alert and oriented x 3.  The mood and affect are normal.  The skin is warm and dry.  Color is normal.   HEENT exam reveals that the sclera are nonicteric.  The mucous membranes are moist.  The carotids are 2+ without bruits.  There is no thyromegaly.  There is no JVD.   lungs are clear.  The chest wall is non tender.    heart exam reveals a regular rate with a normal S1 and S2.  There are no murmurs, gallops, or rubs.   She is tachycardic.  The PMI is not displaced.   Abdominal exam reveals good bowel sounds.  There is no guarding or rebound.  There is no hepatosplenomegaly or tenderness.  There are no masses.   Extremities:   reveal no clubbing, cyanosis, or edema.  The legs are without rashes.  The distal pulses are intact.   Cranial nerves II - XII are intact.  Motor and sensory functions are intact.  The gait is normal. She has some scaly skin around her elbows that may be consistent with psoriasis.  ECG: From her office reveals normal sinus rhythm. She has no ST segment depression and no ST segment elevation. Assessment / Plan:

## 2013-06-30 NOTE — Assessment & Plan Note (Addendum)
Cynthia [name]  presents with symptoms that are consistent with pericarditis. She's had pleuritic chest pain the pain is worse when she lies down better when she sits for. It has responded temporarily to Indocin. She also does have some shortness breath and tachycardia. We will start her on metoprolol 25 mg twice a day for the tachycardia. We'll start culture seen 0.6 mg twice a day for the presumed pericarditis.  We will get an echocardiogram in several days-preferably after her heart rate slows slightly.  Her ECG does not show any evidence of acute pericarditis.    She's developed some arthritis-type pains. She also has developed some psoriasis-like lesions. I question whether or not she has a collagen vascular disease. She will consult Dr. Inda Merlin for further evaluation of this.

## 2013-06-30 NOTE — Patient Instructions (Addendum)
Your physician has recommended you make the following change in your medication:  TAKE Metoprolol 25 mg twice daily TAKE Colchicine 0.6 mg twice daily as needed  Your physician has requested that you regularly monitor and record your blood pressure readings at home. Please use the same machine at the same time of day to check your readings and record them to bring to your follow-up visit.  Your physician has requested that you have an echocardiogram in APPROXIMATELY 1 WEEK.  Echocardiography is a painless test that uses sound waves to create images of your heart. It provides your doctor with information about the size and shape of your heart and how well your heart's chambers and valves are working. This procedure takes approximately one hour. There are no restrictions for this procedure.  Your physician recommends that you schedule a follow-up appointment on:  Wednesday June 24 @ 4:45 pm

## 2013-07-04 ENCOUNTER — Ambulatory Visit (HOSPITAL_COMMUNITY)
Admission: RE | Admit: 2013-07-04 | Discharge: 2013-07-04 | Disposition: A | Discharge status: Home / Self Care | Payer: 59 | Source: Ambulatory Visit | Attending: Internal Medicine | Admitting: Internal Medicine

## 2013-07-04 DIAGNOSIS — R000 Tachycardia, unspecified: Secondary | ICD-10-CM | POA: Insufficient documentation

## 2013-07-04 DIAGNOSIS — R079 Chest pain, unspecified: Secondary | ICD-10-CM | POA: Insufficient documentation

## 2013-07-04 DIAGNOSIS — I517 Cardiomegaly: Secondary | ICD-10-CM

## 2013-07-04 NOTE — Progress Notes (Signed)
2D Echo Performed 07/04/2013    Duru Reiger, RCS  

## 2013-07-06 ENCOUNTER — Telehealth: Payer: Self-pay | Admitting: Nurse Practitioner

## 2013-07-06 ENCOUNTER — Other Ambulatory Visit: Payer: Self-pay

## 2013-07-06 MED ORDER — COLCHICINE 0.6 MG PO TABS: 0.6000 mg | ORAL_TABLET | Freq: Two times a day (BID) | ORAL | Status: DC | PRN

## 2013-07-06 NOTE — Telephone Encounter (Signed)
Received fax from Cynthia [name], Patient Care Advocate who states patient called and states she only received 6 Colchicine pills from [facility].  [facility]ed [facility] order asking [name] to order a replacement medication as Colcrys is on back order.  I advised patient of this information and patient states [facility] never made her aware that they were out of the medication, they only gave her 6 pills and said nothing about it.  Per patient request, I sent order for Colchicine to [facility] on [address].  Patient will notify me if she is unable to get the Rx filled.  Patient reports that she is feeling better; states blood pressure is 112/60, HR 70 bpm.  Patient states Dr. Acie Fredrickson had mentioned increasing her Metoprolol to 50 mg BID but patient does not think medication needs to be increased at this point.  I advised patient to remain on 25 mg BID and to bring blood pressure log to next office visit.  Patient verbalized understanding and agreement.

## 2013-07-27 ENCOUNTER — Encounter: Payer: Self-pay | Admitting: Cardiovascular Disease

## 2013-07-27 ENCOUNTER — Ambulatory Visit (INDEPENDENT_AMBULATORY_CARE_PROVIDER_SITE_OTHER): Payer: 59 | Admitting: Cardiovascular Disease

## 2013-07-27 VITALS — BP 130/80 | HR 84 | Ht 67.25 in | Wt 223.6 lb

## 2013-07-27 DIAGNOSIS — I309 Acute pericarditis, unspecified: Secondary | ICD-10-CM

## 2013-07-27 NOTE — Assessment & Plan Note (Signed)
Cynthia [name]  seems to be doing quite a bit better. Her pleuritic chest pain has resolved significantly since starting colchicine. She's been to see Dr. Charlestine Night and she was found to have abnormal labs consistent with lupus or one of the other collagen vascular diseases. She will pursue this further with him.  I'll see her again in one year for followup as it and to manage her metoprolol. Alternatively, if Dr. Inda Merlin is was to finish her metoprolol, I'll see her back on an as-needed basis.

## 2013-07-27 NOTE — Patient Instructions (Signed)
Your physician recommends that you continue on your current medications as directed. Please refer to the Current Medication list given to you today.  Your physician wants you to follow-up in: 1 year with Dr. Nahser.  You will receive a reminder letter in the mail two months in advance. If you don't receive a letter, please call our office to schedule the follow-up appointment.  

## 2013-07-27 NOTE — Progress Notes (Signed)
Cynthia [name] [id number] [birth year]  Problem List:  1. Tachycardia 2. Diastolic dysfunction 3. Chest pain - ? Pericarditis, smooth and normal cors by cath  (11/15/08) 4. Hyperlipidemia 5. Hypertension 6. Obesity 7. Hypothyroidism 8. Hx of Fiths' disease    History of Present Illness:  Cynthia [name] is a middle-aged female with the history of pericardial like pleuritic chest pain, diastolic congestive heart failure, hyperlipidemia, obesity, and hypertension. She also has a history of hypothyroidism is on Synthroid replacement. She has had significant pleuritic chest pain for the past several weeks. She's been on Indocin and it seems to be helping.   In the past we have performed a heart catheterization on her which revealed smooth and normal coronary arteries. She has had a cardiopulmonary stress test which revealed no significant cardiac abnormalities.  Jun 30, 2013:  Cynthia [name] called this week with episodes of chest pain and tachycardia. Her chest pains sounded pleuritic. She has gotten some relief with Indocin but the pain keeps returning.  July 27, 2013:  Cynthia [name] is doing ok.  The colchicine seems to help the pleuritic chest pain. She has been to see Dr. for slow and she has been found to have some collagen vascular labs are abnormal.  There is something that she may have lupus.     Current Outpatient Prescriptions on File Prior to Visit  Medication Sig Dispense Refill  . ALPRAZolam (XANAX) 0.5 MG tablet Take 0.5 mg by mouth daily as needed. For anxiety      . atorvastatin (LIPITOR) 40 MG tablet       . colchicine 0.6 MG tablet Take 1 tablet (0.6 mg total) by mouth 2 (two) times daily as needed.  60 tablet  3  . Est Estrogens-Methyltest (ESTRATEST PO) Take by mouth daily.        Marland Kitchen lamoTRIgine (LAMICTAL) 200 MG tablet       . levothyroxine (SYNTHROID, LEVOTHROID) 75 MCG tablet Take 75 mcg by mouth daily.        . metoprolol tartrate (LOPRESSOR) 25 MG tablet Take 1 tablet (25 mg total) by  mouth 2 (two) times daily.  180 tablet  3  . ondansetron (ZOFRAN ODT) 8 MG disintegrating tablet Take 1 tablet (8 mg total) by mouth every 8 (eight) hours as needed for nausea.  30 tablet  0  . rizatriptan (MAXALT) 10 MG tablet Take 10 mg by mouth as needed. May repeat in 2 hours if needed      . SEROQUEL XR 400 MG 24 hr tablet       . zolpidem (AMBIEN) 10 MG tablet       . zonisamide (ZONEGRAN) 100 MG capsule        Current Facility-Administered Medications on File Prior to Visit  Medication Dose Route Frequency Lorita Forinash Last Rate Last Dose  . 0.9 %  sodium chloride infusion  500 mL Intravenous Continuous Penni Bombard, MD 500 mL/hr at 04/28/12 1156 500 mL at 04/28/12 1156  . promethazine (PHENERGAN) injection 25 mg  25 mg Intravenous Once Penni Bombard, MD      . valproate (DEPACON) 1,000 mg in sodium chloride 0.9 % 100 mL IVPB  1,000 mg Intravenous Continuous Penni Bombard, MD 110 mL/hr at 04/28/12 1204 1,000 mg at 04/28/12 1204    Allergies  Allergen Reactions  . Ampicillin     SERUM SICKNESS REACTION  . Imitrex [Sumatriptan]   . Prednisone   . Zoloft [Sertraline Hcl]     Past Medical  History  Diagnosis Date  . Diastolic congestive heart failure   . Hyperlipemia   . Hypothyroidism   . History of depression   . History of migraines   . Rosacea   . Anxiety   . Pericarditis   . Bladder disorder   . GERD (gastroesophageal reflux disease)   . Diverticulosis   . Colitis 2006  . Vitamin D deficiency   . Colon polyps     Past Surgical History  Procedure Laterality Date  . Total abdominal hysterectomy w/ bilateral salpingoophorectomy    . Cholecystectomy    . Cardiac catheterization  11/15/08    SMOOTH AND NORMAL  . Shoulder surgery      left  . Laparoscopy    . Polypectomy  02/2009    History  Smoking status  . Never Smoker   Smokeless tobacco  . Never Used    History  Alcohol Use No    Family History  Problem Relation Age of Onset  .  Adopted: Yes    Reviw of Systems:  The patient denies any heat or cold intolerance.  No weight gain or weight loss.  The patient denies headaches or blurry vision.  There is no cough or sputum production.  The patient denies dizziness.  There is no hematuria or hematochezia.  The patient denies any muscle aches or arthritis.  The patient denies any rash.  The patient denies frequent falling or instability.  There is no history of depression or anxiety.  All other systems were reviewed and are negative.  Physical Exam: BP 130/80  Pulse 84  Ht 5' 7.25" (1.708 m)  Wt 223 lb 9.6 oz (101.424 kg)  BMI 34.77 kg/m2 The patient is alert and oriented x 3.  The mood and affect are normal.  The skin is warm and dry.  Color is normal.   HEENT exam reveals that the sclera are nonicteric.  The mucous membranes are moist.  The carotids are 2+ without bruits.  There is no thyromegaly.  There is no JVD.   lungs are clear.  The chest wall is non tender.    heart exam reveals a regular rate with a normal S1 and S2.  There are no murmurs, gallops, or rubs.   She is tachycardic.  The PMI is not displaced.   Abdominal exam reveals good bowel sounds.  There is no guarding or rebound.  There is no hepatosplenomegaly or tenderness.  There are no masses.   Extremities:   reveal no clubbing, cyanosis, or edema.  The legs are without rashes.  The distal pulses are intact.   Cranial nerves II - XII are intact.  Motor and sensory functions are intact.  The gait is normal. She has some scaly skin around her elbows that may be consistent with psoriasis.  ECG: From her office reveals normal sinus rhythm. She has no ST segment depression and no ST segment elevation. Assessment / Plan:

## 2013-08-15 ENCOUNTER — Ambulatory Visit: Payer: Self-pay | Admitting: Cardiovascular Disease

## 2014-01-02 ENCOUNTER — Other Ambulatory Visit: Payer: Self-pay

## 2014-01-02 DIAGNOSIS — Z1231 Encounter for screening mammogram for malignant neoplasm of breast: Secondary | ICD-10-CM

## 2014-01-18 ENCOUNTER — Ambulatory Visit
Admission: RE | Admit: 2014-01-18 | Discharge: 2014-01-18 | Disposition: A | Discharge status: Home / Self Care | Payer: 59 | Source: Ambulatory Visit

## 2014-01-18 ENCOUNTER — Other Ambulatory Visit: Payer: Self-pay

## 2014-01-18 DIAGNOSIS — Z1231 Encounter for screening mammogram for malignant neoplasm of breast: Secondary | ICD-10-CM

## 2014-05-24 ENCOUNTER — Other Ambulatory Visit: Payer: Self-pay | Admitting: Cardiovascular Disease

## 2014-07-13 DIAGNOSIS — R109 Unspecified abdominal pain: Secondary | ICD-10-CM | POA: Insufficient documentation

## 2014-10-03 ENCOUNTER — Other Ambulatory Visit: Payer: Self-pay | Admitting: Cardiovascular Disease

## 2014-12-05 ENCOUNTER — Other Ambulatory Visit: Payer: Self-pay | Admitting: Cardiovascular Disease

## 2014-12-20 ENCOUNTER — Other Ambulatory Visit: Payer: Self-pay

## 2014-12-20 DIAGNOSIS — Z1231 Encounter for screening mammogram for malignant neoplasm of breast: Secondary | ICD-10-CM

## 2015-01-04 ENCOUNTER — Other Ambulatory Visit: Payer: Self-pay | Admitting: Cardiovascular Disease

## 2015-01-23 ENCOUNTER — Ambulatory Visit
Admission: RE | Admit: 2015-01-23 | Discharge: 2015-01-23 | Disposition: A | Discharge status: Home / Self Care | Payer: 59 | Source: Ambulatory Visit

## 2015-01-23 DIAGNOSIS — Z1231 Encounter for screening mammogram for malignant neoplasm of breast: Secondary | ICD-10-CM

## 2015-01-28 ENCOUNTER — Other Ambulatory Visit: Payer: Self-pay | Admitting: Cardiovascular Disease

## 2015-01-31 ENCOUNTER — Other Ambulatory Visit: Payer: Self-pay | Admitting: Cardiovascular Disease

## 2015-02-04 HISTORY — PX: LAPAROTOMY: SHX154

## 2015-02-27 ENCOUNTER — Other Ambulatory Visit: Payer: Self-pay | Admitting: Cardiovascular Disease

## 2015-02-27 ENCOUNTER — Telehealth: Payer: Self-pay

## 2015-02-27 NOTE — Telephone Encounter (Signed)
Called and spoke with patient about refill request for Metoprolol. Patient stated she gets this from her PCP. She will call and let us know if she needs anything.

## 2015-08-13 ENCOUNTER — Telehealth: Payer: Self-pay | Admitting: Internal Medicine

## 2015-08-13 NOTE — Telephone Encounter (Signed)
Pt states she has been having problems with RUQ abd pain and bloating. States her PCP gave her hyoscyamine and that it has not helped. Pt scheduled to see Amy Esterwood PA 08/22/15@10am . Pt aware of appt.

## 2015-08-16 ENCOUNTER — Observation Stay (HOSPITAL_COMMUNITY): Payer: 59

## 2015-08-16 ENCOUNTER — Encounter (HOSPITAL_COMMUNITY): Payer: Self-pay | Admitting: Emergency Medicine

## 2015-08-16 ENCOUNTER — Other Ambulatory Visit: Payer: Self-pay | Admitting: Nurse Practitioner

## 2015-08-16 ENCOUNTER — Inpatient Hospital Stay (HOSPITAL_COMMUNITY)
Admission: EM | Admit: 2015-08-16 | Discharge: 2015-08-19 | DRG: 389 | Disposition: A | Discharge status: Home / Self Care | Payer: 59 | Attending: Internal Medicine | Admitting: Internal Medicine

## 2015-08-16 ENCOUNTER — Ambulatory Visit
Admission: RE | Admit: 2015-08-16 | Discharge: 2015-08-16 | Disposition: A | Discharge status: Home / Self Care | Payer: 59 | Source: Ambulatory Visit | Attending: Nurse Practitioner | Admitting: Nurse Practitioner

## 2015-08-16 DIAGNOSIS — Z87442 Personal history of urinary calculi: Secondary | ICD-10-CM

## 2015-08-16 DIAGNOSIS — R1115 Cyclical vomiting syndrome unrelated to migraine: Secondary | ICD-10-CM | POA: Diagnosis present

## 2015-08-16 DIAGNOSIS — G43909 Migraine, unspecified, not intractable, without status migrainosus: Secondary | ICD-10-CM

## 2015-08-16 DIAGNOSIS — R140 Abdominal distension (gaseous): Secondary | ICD-10-CM

## 2015-08-16 DIAGNOSIS — I5032 Chronic diastolic (congestive) heart failure: Secondary | ICD-10-CM | POA: Diagnosis present

## 2015-08-16 DIAGNOSIS — K5900 Constipation, unspecified: Secondary | ICD-10-CM

## 2015-08-16 DIAGNOSIS — Z79890 Hormone replacement therapy (postmenopausal): Secondary | ICD-10-CM

## 2015-08-16 DIAGNOSIS — Z90710 Acquired absence of both cervix and uterus: Secondary | ICD-10-CM

## 2015-08-16 DIAGNOSIS — Z888 Allergy status to other drugs, medicaments and biological substances status: Secondary | ICD-10-CM

## 2015-08-16 DIAGNOSIS — Z9049 Acquired absence of other specified parts of digestive tract: Secondary | ICD-10-CM

## 2015-08-16 DIAGNOSIS — A084 Viral intestinal infection, unspecified: Secondary | ICD-10-CM | POA: Diagnosis present

## 2015-08-16 DIAGNOSIS — K5669 Other intestinal obstruction: Secondary | ICD-10-CM | POA: Diagnosis not present

## 2015-08-16 DIAGNOSIS — Z79899 Other long term (current) drug therapy: Secondary | ICD-10-CM

## 2015-08-16 DIAGNOSIS — Z4659 Encounter for fitting and adjustment of other gastrointestinal appliance and device: Secondary | ICD-10-CM

## 2015-08-16 DIAGNOSIS — R1084 Generalized abdominal pain: Secondary | ICD-10-CM | POA: Diagnosis not present

## 2015-08-16 DIAGNOSIS — F329 Major depressive disorder, single episode, unspecified: Secondary | ICD-10-CM | POA: Diagnosis present

## 2015-08-16 DIAGNOSIS — G43A1 Cyclical vomiting, intractable: Secondary | ICD-10-CM | POA: Diagnosis not present

## 2015-08-16 DIAGNOSIS — E039 Hypothyroidism, unspecified: Secondary | ICD-10-CM | POA: Diagnosis present

## 2015-08-16 DIAGNOSIS — Z86010 Personal history of colonic polyps: Secondary | ICD-10-CM

## 2015-08-16 DIAGNOSIS — R1010 Upper abdominal pain, unspecified: Secondary | ICD-10-CM

## 2015-08-16 DIAGNOSIS — I5030 Unspecified diastolic (congestive) heart failure: Secondary | ICD-10-CM | POA: Diagnosis present

## 2015-08-16 DIAGNOSIS — K56600 Partial intestinal obstruction, unspecified as to cause: Secondary | ICD-10-CM

## 2015-08-16 DIAGNOSIS — K56609 Unspecified intestinal obstruction, unspecified as to partial versus complete obstruction: Secondary | ICD-10-CM

## 2015-08-16 DIAGNOSIS — K567 Ileus, unspecified: Secondary | ICD-10-CM

## 2015-08-16 DIAGNOSIS — R109 Unspecified abdominal pain: Secondary | ICD-10-CM | POA: Insufficient documentation

## 2015-08-16 DIAGNOSIS — F419 Anxiety disorder, unspecified: Secondary | ICD-10-CM | POA: Diagnosis present

## 2015-08-16 DIAGNOSIS — Z880 Allergy status to penicillin: Secondary | ICD-10-CM

## 2015-08-16 DIAGNOSIS — K210 Gastro-esophageal reflux disease with esophagitis: Secondary | ICD-10-CM | POA: Diagnosis present

## 2015-08-16 DIAGNOSIS — E785 Hyperlipidemia, unspecified: Secondary | ICD-10-CM | POA: Diagnosis present

## 2015-08-16 DIAGNOSIS — R1011 Right upper quadrant pain: Secondary | ICD-10-CM | POA: Diagnosis not present

## 2015-08-16 DIAGNOSIS — Z8659 Personal history of other mental and behavioral disorders: Secondary | ICD-10-CM

## 2015-08-16 DIAGNOSIS — R339 Retention of urine, unspecified: Secondary | ICD-10-CM | POA: Diagnosis present

## 2015-08-16 HISTORY — DX: Unspecified intestinal obstruction, unspecified as to partial versus complete obstruction: K56.609

## 2015-08-16 HISTORY — DX: Calculus of kidney: N20.0

## 2015-08-16 LAB — COMPREHENSIVE METABOLIC PANEL
ALK PHOS: 126 U/L (ref 38–126)
ALT: 19 U/L (ref 14–54)
AST: 28 U/L (ref 15–41)
Albumin: 4.1 g/dL (ref 3.5–5.0)
Anion gap: 9 (ref 5–15)
BUN: 8 mg/dL (ref 6–20)
CHLORIDE: 105 mmol/L (ref 101–111)
CO2: 25 mmol/L (ref 22–32)
Calcium: 8.6 mg/dL — ABNORMAL LOW (ref 8.9–10.3)
Creatinine, Ser: 0.63 mg/dL (ref 0.44–1.00)
GFR calc Af Amer: >60 mL/min (ref >60)
Glucose, Bld: 78 mg/dL (ref 65–99)
Potassium: 3.5 mmol/L (ref 3.5–5.1)
Sodium: 139 mmol/L (ref 135–145)
TOTAL PROTEIN: 7.6 g/dL (ref 6.5–8.1)
Total Bilirubin: 0.7 mg/dL (ref 0.3–1.2)

## 2015-08-16 LAB — CBC WITH DIFFERENTIAL/PLATELET
Basophils Absolute: 0 10*3/uL (ref 0.0–0.1)
Basophils Relative: 0 %
EOS PCT: 4 %
Eosinophils Absolute: 0.3 10*3/uL (ref 0.0–0.7)
HEMATOCRIT: 39.3 % (ref 36.0–46.0)
Hemoglobin: 12.8 g/dL (ref 12.0–15.0)
LYMPHS ABS: 1.6 10*3/uL (ref 0.7–4.0)
LYMPHS PCT: 19 %
MCH: 30.6 pg (ref 26.0–34.0)
MCHC: 32.6 g/dL (ref 30.0–36.0)
MCV: 94 fL (ref 78.0–100.0)
Monocytes Absolute: 0.6 10*3/uL (ref 0.1–1.0)
Monocytes Relative: 7 %
Neutro Abs: 5.9 10*3/uL (ref 1.7–7.7)
Neutrophils Relative %: 70 %
Platelets: 241 10*3/uL (ref 150–400)
RBC: 4.18 MIL/uL (ref 3.87–5.11)
RDW: 13.3 % (ref 11.5–15.5)
WBC: 8.4 10*3/uL (ref 4.0–10.5)

## 2015-08-16 LAB — URINALYSIS, ROUTINE W REFLEX MICROSCOPIC
BILIRUBIN URINE: NEGATIVE
Glucose, UA: NEGATIVE mg/dL
HGB URINE DIPSTICK: NEGATIVE
Ketones, ur: NEGATIVE mg/dL
Leukocytes, UA: NEGATIVE
NITRITE: NEGATIVE
PROTEIN: NEGATIVE mg/dL
Specific Gravity, Urine: 1.024 (ref 1.005–1.030)
pH: 6.5 (ref 5.0–8.0)

## 2015-08-16 LAB — LIPASE, BLOOD: Lipase: 21 U/L (ref 11–51)

## 2015-08-16 LAB — MAGNESIUM: Magnesium: 1.9 mg/dL (ref 1.7–2.4)

## 2015-08-16 LAB — PHOSPHORUS: PHOSPHORUS: 2.9 mg/dL (ref 2.5–4.6)

## 2015-08-16 MED ORDER — IOPAMIDOL (ISOVUE-300) INJECTION 61%
125.0000 mL | Freq: Once | INTRAVENOUS | Status: AC | PRN
  Administered 2015-08-16: 125 mL via INTRAVENOUS

## 2015-08-16 MED ORDER — ONDANSETRON HCL 4 MG/2ML IJ SOLN
4.0000 mg | Freq: Once | INTRAMUSCULAR | Status: AC
  Administered 2015-08-16: 4 mg via INTRAVENOUS
  Filled 2015-08-16: qty 2

## 2015-08-16 MED ORDER — ENOXAPARIN SODIUM 40 MG/0.4ML ~~LOC~~ SOLN: 40.0000 mg | SUBCUTANEOUS | Status: DC

## 2015-08-16 MED ORDER — ONDANSETRON HCL 4 MG/2ML IJ SOLN
4.0000 mg | Freq: Four times a day (QID) | INTRAMUSCULAR | Status: DC | PRN
  Administered 2015-08-17 – 2015-08-18 (×5): 4 mg via INTRAVENOUS
  Filled 2015-08-16 (×5): qty 2

## 2015-08-16 MED ORDER — POTASSIUM CHLORIDE IN NACL 20-0.9 MEQ/L-% IV SOLN
INTRAVENOUS | Status: DC
  Administered 2015-08-16 – 2015-08-17 (×2): 1000 mL via INTRAVENOUS
  Administered 2015-08-17 – 2015-08-19 (×4): via INTRAVENOUS
  Filled 2015-08-16 (×9): qty 1000

## 2015-08-16 MED ORDER — SODIUM CHLORIDE 0.9 % IV SOLN
INTRAVENOUS | Status: DC
  Administered 2015-08-16: 18:00:00 via INTRAVENOUS

## 2015-08-16 MED ORDER — SODIUM CHLORIDE 0.9 % IV BOLUS (SEPSIS)
1000.0000 mL | Freq: Once | INTRAVENOUS | Status: AC
  Administered 2015-08-16: 1000 mL via INTRAVENOUS

## 2015-08-16 MED ORDER — MORPHINE SULFATE (PF) 4 MG/ML IV SOLN
4.0000 mg | INTRAVENOUS | Status: DC | PRN
  Administered 2015-08-16 – 2015-08-17 (×4): 4 mg via INTRAVENOUS
  Filled 2015-08-16 (×4): qty 1

## 2015-08-16 MED ORDER — METOPROLOL TARTRATE 5 MG/5ML IV SOLN: 5.0000 mg | Freq: Four times a day (QID) | INTRAVENOUS | Status: DC

## 2015-08-16 MED ORDER — LORAZEPAM 2 MG/ML IJ SOLN
1.0000 mg | Freq: Every evening | INTRAMUSCULAR | Status: AC | PRN
  Administered 2015-08-16 – 2015-08-17 (×2): 1 mg via INTRAVENOUS
  Filled 2015-08-16 (×2): qty 1

## 2015-08-16 MED ORDER — FAMOTIDINE IN NACL 20-0.9 MG/50ML-% IV SOLN
20.0000 mg | Freq: Two times a day (BID) | INTRAVENOUS | Status: DC
  Administered 2015-08-16 – 2015-08-18 (×5): 20 mg via INTRAVENOUS
  Filled 2015-08-16 (×5): qty 50

## 2015-08-16 MED ORDER — MORPHINE SULFATE (PF) 4 MG/ML IV SOLN
4.0000 mg | Freq: Once | INTRAVENOUS | Status: AC
  Administered 2015-08-16: 4 mg via INTRAVENOUS
  Filled 2015-08-16: qty 1

## 2015-08-16 NOTE — ED Notes (Signed)
Pt has been made aware that a urine sample is needed, states that she just went to the bathroom.

## 2015-08-16 NOTE — H&P (Signed)
History and Physical    Cynthia [name] [id number] DOB: [birth year] DOA: 08/16/2015  PCP: Henrine Screws, MD   Patient coming from: Home.  Chief Complaint: Abdominal Pain.  HPI: Cynthia [name] is a 50 y.o. female with medical history significant of pericarditis induced diastolic heart failure, not present on last echocardiogram, hypothyroidism, depression, migraines, rosacea, anxiety, GERD, diverticulosis, vitamin D deficiency, kidney stone, colon polyps who comes to the emergency department due to intermittent abdominal pain for past 6 weeks associated with emesis.   Per patient, today she woke up with intense abdominal pain in RUQ, bloating sensation and has had multiple episodes of vomiting since then. She describes the pain as sharp, colic like, occasionally radiated to her right scapula. She denies fever, chills, diarrhea, constipation, melena or hematochezia. She denies GU symptoms.   She went to see her PCP today who performed basic labs and a CT scan of the abdomen/pelvis. Labs were repeated at [facility] ED and GI has been consulted.  ED Course: The patient received IVF, analgesics and antiemetics. She states she feels relief from the treatment. Labs were unremarkable, CT scan abdomen/pelvis showed enteritis, ileus with possible early SBO. She was seen by gastroenterology.   Review of Systems: As per HPI otherwise 10 point review of systems negative. CT scan abdomen/pelvis    Past Medical History  Diagnosis Date  . Diastolic congestive heart failure ([place])   . Hyperlipemia   . Hypothyroidism   . History of depression   . History of migraines   . Rosacea   . Anxiety   . Pericarditis   . Bladder disorder   . GERD (gastroesophageal reflux disease)   . Diverticulosis   . Colitis 2006  . Vitamin D deficiency   . Colon polyps   . Kidney stone     Past Surgical History  Procedure Laterality Date  . Total abdominal hysterectomy w/ bilateral salpingoophorectomy    .  Cholecystectomy    . Cardiac catheterization  11/15/08    SMOOTH AND NORMAL  . Shoulder surgery      left  . Laparoscopy    . Polypectomy  02/2009     reports that she has never smoked. She has never used smokeless tobacco. She reports that she does not drink alcohol or use illicit drugs.  Allergies  Allergen Reactions  . Ampicillin     SERUM SICKNESS REACTION  . Imitrex [Sumatriptan] Other (See Comments)    Chest Pain  . Prednisone Other (See Comments)    Eye Irritation.  Marland Kitchen Zoloft [Sertraline Hcl] Other (See Comments)    hyperactive    Family History  Problem Relation Age of Onset  . Adopted: Yes    Prior to Admission medications   Medication Sig Start Date End Date Taking? Authorizing Provider  ALPRAZolam Duanne Moron) 1 MG tablet Take 0.5 mg by mouth at bedtime as needed for anxiety.   Yes Historical Provider, MD  atorvastatin (LIPITOR) 40 MG tablet Take 40 mg by mouth daily at 6 PM. Reported on 08/16/2015 06/13/13  Yes Historical Provider, MD  Est Estrogens-Methyltest (ESTRATEST PO) Take 1 tablet by mouth daily.    Yes Historical Provider, MD  lamoTRIgine (LAMICTAL) 200 MG tablet Take 200 mg by mouth at bedtime.  06/29/13  Yes Historical Provider, MD  levothyroxine (SYNTHROID, LEVOTHROID) 75 MCG tablet Take 75 mcg by mouth daily.     Yes Historical Provider, MD  metoprolol tartrate (LOPRESSOR) 25 MG tablet TAKE 1 TABLET BY MOUTH 2 TIMES DAILY  01/31/15  Yes Thayer Headings, MD  QUEtiapine (SEROQUEL XR) 300 MG 24 hr tablet Take 300 mg by mouth at bedtime.   Yes Historical Provider, MD  rizatriptan (MAXALT) 10 MG tablet Take 10 mg by mouth as needed. May repeat in 2 hours if needed   Yes Historical Provider, MD  zolpidem (AMBIEN) 10 MG tablet Take 10 mg by mouth at bedtime as needed for sleep.  06/10/13  Yes Historical Provider, MD  colchicine 0.6 MG tablet Take 1 tablet (0.6 mg total) by mouth 2 (two) times daily as needed. Patient not taking: Reported on 08/16/2015 07/06/13   Thayer Headings, MD  ondansetron (ZOFRAN ODT) 8 MG disintegrating tablet Take 1 tablet (8 mg total) by mouth every 8 (eight) hours as needed for nausea. Patient not taking: Reported on 08/16/2015 04/28/12   Penni Bombard, MD    Physical Exam: Filed Vitals:   08/16/15 1255 08/16/15 1530 08/16/15 1720  BP: 122/65 125/81 98/66  Pulse: 92 87 89  Temp: 98.8 F (37.1 C)  98.3 F (36.8 C)  TempSrc: Oral  Oral  Resp: 16 18 16   SpO2: 97% 97% 96%      Constitutional: NAD, calm, comfortable. Filed Vitals:   08/16/15 1255 08/16/15 1530 08/16/15 1720  BP: 122/65 125/81 98/66  Pulse: 92 87 89  Temp: 98.8 F (37.1 C)  98.3 F (36.8 C)  TempSrc: Oral  Oral  Resp: 16 18 16   SpO2: 97% 97% 96%   Eyes: PERRL, lids and conjunctivae normal ENMT: Mucous membranes are mildly dry. Posterior pharynx clear of any exudate or lesions. Neck: normal, supple, no masses, no thyromegaly Respiratory: Clear to auscultation bilaterally, no wheezing, no crackles. Normal respiratory effort.   Cardiovascular: Regular rate and rhythm, no murmurs / rubs / gallops. No extremity edema. 2+ pedal pulses. No carotid bruits.  Abdomen: Mildly distended, + surgical scars, Bowel sounds positive. Soft, positive diffuse tenderness more intense on left quadrants, no guarding, no rebound, no masses palpated. No hepatosplenomegaly.  Musculoskeletal: no clubbing / cyanosis. No joint deformity upper and lower extremities. Good ROM, no contractures. Normal muscle tone.  Skin: no rashes, lesions, ulcers. No induration Neurologic: CN 2-12 grossly intact. Sensation intact, DTR normal. Strength 5/5 in all 4.  Psychiatric: Normal judgment and insight. Alert and oriented x 4. Normal mood.     Labs on Admission: I have personally reviewed following labs and imaging studies  CBC:  Recent Labs Lab 08/16/15 1639  WBC 8.4  NEUTROABS 5.9  HGB 12.8  HCT 39.3  MCV 94.0  PLT [id number]   Basic Metabolic Panel:  Recent Labs Lab  08/16/15 1639  NA 139  K 3.5  CL 105  CO2 25  GLUCOSE 78  BUN 8  CREATININE 0.63  CALCIUM 8.6*   GFR: CrCl cannot be calculated (Unknown ideal weight.). Liver Function Tests:  Recent Labs Lab 08/16/15 1639  AST 28  ALT 19  ALKPHOS 126  BILITOT 0.7  PROT 7.6  ALBUMIN 4.1    Recent Labs Lab 08/16/15 1639  LIPASE 21   Urine analysis:    Component Value Date/Time   COLORURINE YELLOW 08/16/2015 1814   APPEARANCEUR CLEAR 08/16/2015 1814   LABSPEC 1.024 08/16/2015 1814   PHURINE 6.5 08/16/2015 1814   GLUCOSEU NEGATIVE 08/16/2015 1814   HGBUR NEGATIVE 08/16/2015 1814   [place] NEGATIVE 08/16/2015 1814   KETONESUR NEGATIVE 08/16/2015 1814   PROTEINUR NEGATIVE 08/16/2015 1814   NITRITE NEGATIVE 08/16/2015 1814   LEUKOCYTESUR  NEGATIVE 08/16/2015 1814    Radiological Exams on Admission: Ct Abdomen Pelvis W Contrast  08/16/2015  CLINICAL DATA:  Right upper quadrant pain, bloating, nausea and vomiting for 6 weeks EXAM: CT ABDOMEN AND PELVIS WITH CONTRAST TECHNIQUE: Multidetector CT imaging of the abdomen and pelvis was performed using the standard protocol following bolus administration of intravenous contrast. CONTRAST:  140mL ISOVUE-300 IOPAMIDOL (ISOVUE-300) INJECTION 61% COMPARISON:  01/17/2014 FINDINGS: Lower chest:  The lung bases are unremarkable. Hepatobiliary: The patient is status postcholecystectomy. No focal hepatic mass. No intrahepatic biliary ductal dilatation. Pancreas: Enhanced pancreas is unremarkable. Spleen: Enhanced spleen is unremarkable. Adrenals/Urinary Tract: No adrenal gland mass. Kidneys are symmetrical in enhancement. No hydronephrosis or hydroureter. Delayed renal images shows bilateral renal symmetrical excretion. Bilateral visualized proximal ureter is unremarkable. Stomach/Bowel: Mild gaseous distension of the stomach with fluid and some gas without evidence of gastric outlet obstruction. Mild diffuse small bowel distension with fluid and some  air-fluid levels. Findings highly suspicious for diffuse enteritis or ileus. Less likely partial small bowel obstruction. No transition point in caliber of small bowel. Some liquid stool and gas noted within cecum. Normal appendix is noted in axial image 51. Some colonic stool noted within transverse colon and descending colon. Moderate gas noted in proximal sigmoid colon. Mid sigmoid colon is collapsed small caliber. Some colonic stool noted within rectum. No distal colonic obstruction. Vascular/Lymphatic: No aortic aneurysm. No retroperitoneal or mesenteric adenopathy. Reproductive: The patient is status post hysterectomy. No pelvic mass is noted. Other: No ascites or free abdominal air. The urinary bladder is unremarkable. Small nonspecific bilateral inguinal lymph nodes are noted. Musculoskeletal: No destructive bony lesions are noted. Mild degenerative changes bilateral SI joints. Sagittal images of the spine shows mild degenerative changes thoracolumbar spine. IMPRESSION: 1. There is mild gaseous distension of the stomach with fluid and gas. No evidence of gastric outlet obstruction. Diffuse mild distension of small bowel with fluid and some air-fluid levels. There is no transition point in caliber of small bowel. Findings highly suspicious for diffuse enteritis or ileus. Less likely early bowel obstruction. Clinical correlation is necessary. Some liquid stool and gas noted within cecum. Normal appendix. No pericecal inflammation. 2. No colonic obstruction. No evidence of colitis or diverticulitis. 3. No hydronephrosis or hydroureter. 4. Status post hysterectomy. 5. Mild degenerative changes thoracolumbar spine. Electronically Signed   By: Lahoma Crocker M.D.   On: 08/16/2015 11:11    Assessment/Plan Principal Problem:   SBO (small bowel obstruction) ([place]) Admit to Medsurg. Continue IVF with potassium supplementation. Analgesics and antiemetics as needed.  Follow up CBC, CMP in AM. Follow UP abdomen  X-ray in AM. GI will be following. Will consult general surgery if it worsens.  Active Problems:   History of Diastolic congestive heart failure ([place]) Per patient, this was diagnosed when she had an episode of pericarditis.  Not present on most recent echocardiogram.    Hyperlipemia Hold atorvastatin. Monitor LFT's.    Hypothyroidism Hold levothyroxine while NPO.    History of depression/Anxiety Resume antidepressants once tolerating oral intake. Lorazepam at bedtime PRN         DVT prophylaxis: SCDs Code Status: Full. Family Communication: Her husband was present in the room. Disposition Plan: Admit for SBO treatment for 24-48 hours. Consults called: Gastroenterology (Dr. Wilfrid Lund) Admission status: Observation/Medsurg   Reubin Milan MD [facility] Pager [phone].  If 7PM-7AM, please contact night-coverage [email or web address] Password [facility]  08/16/2015, 6:41 PM

## 2015-08-16 NOTE — ED Notes (Signed)
Attempt to start IV unsuccessful. Pt stuck multiple times just prior to arrival. IV team consult placed.

## 2015-08-16 NOTE — ED Provider Notes (Signed)
CSN: [id number]     Arrival date & time 08/16/15  1215 History   First MD Initiated Contact with Patient 08/16/15 1430     Chief Complaint  Patient presents with  . Abdominal Pain  . Emesis   HPI   Cynthia [name] is a 50 y.o. female PMH significant for hyperlipidemia, diastolic CHF, hypothyroidism, depression, GERD, diverticulosis, colitis, kidney stones, colon polyps (last colonoscopy 4 years ago) presenting with a 12 hour history of abdominal pain. She reports she has had intermittent abdominal pain for the last 6 weeks. She describes her pain is right upper quadrant in location as well as left upper quadrant, intermittent, nonradiating, 6/10 pain scale, waking her from sleep this morning at 3 am. She states this morning she vomited approximately 6 times (emesis was nonbloody, nonbilious; light [place] in color). She states she normally has a bowel movement every 2 days, but has had increased bowel urgency this morning and produced only a small bowel movement which she describes is solid and light [place] in color. She states that last night she ate [place] food (chips and salsa), popcorn. She has not eaten anything this morning. She denies fevers, chills, diarrhea.  PCP: Dr. Josetta Huddle. She saw his NP today who ordered a stat CT abdomen/pelvis and then instructed patient to come to ED.    Past Medical History  Diagnosis Date  . Diastolic congestive heart failure ([place])   . Hyperlipemia   . Hypothyroidism   . History of depression   . History of migraines   . Rosacea   . Anxiety   . Pericarditis   . Bladder disorder   . GERD (gastroesophageal reflux disease)   . Diverticulosis   . Colitis 2006  . Vitamin D deficiency   . Colon polyps   . Kidney stone    Past Surgical History  Procedure Laterality Date  . Total abdominal hysterectomy w/ bilateral salpingoophorectomy    . Cholecystectomy    . Cardiac catheterization  11/15/08    SMOOTH AND NORMAL  . Shoulder surgery      left  .  Laparoscopy    . Polypectomy  02/2009   Family History  Problem Relation Age of Onset  . Adopted: Yes   Social History  Substance Use Topics  . Smoking status: Never Smoker   . Smokeless tobacco: Never Used  . Alcohol Use: No   OB History    No data available     Review of Systems  Ten systems are reviewed and are negative for acute change except as noted in the HPI  Allergies  Ampicillin; Imitrex; Prednisone; and Zoloft  Home Medications   Prior to Admission medications   Medication Sig Start Date End Date Taking? Authorizing Provider  ALPRAZolam Duanne Moron) 0.5 MG tablet Take 0.5 mg by mouth daily as needed. For anxiety    Historical Provider, MD  atorvastatin (LIPITOR) 40 MG tablet  06/13/13   Historical Provider, MD  colchicine 0.6 MG tablet Take 1 tablet (0.6 mg total) by mouth 2 (two) times daily as needed. 07/06/13   Thayer Headings, MD  Est Estrogens-Methyltest (ESTRATEST PO) Take by mouth daily.      Historical Provider, MD  lamoTRIgine (LAMICTAL) 200 MG tablet  06/29/13   Historical Provider, MD  levothyroxine (SYNTHROID, LEVOTHROID) 75 MCG tablet Take 75 mcg by mouth daily.      Historical Provider, MD  metoprolol tartrate (LOPRESSOR) 25 MG tablet TAKE 1 TABLET BY MOUTH 2 TIMES DAILY 01/31/15  Thayer Headings, MD  ondansetron (ZOFRAN ODT) 8 MG disintegrating tablet Take 1 tablet (8 mg total) by mouth every 8 (eight) hours as needed for nausea. 04/28/12   Penni Bombard, MD  predniSONE (DELTASONE) 20 MG tablet Take 20 mg by mouth daily with breakfast.    Historical Provider, MD  rizatriptan (MAXALT) 10 MG tablet Take 10 mg by mouth as needed. May repeat in 2 hours if needed    Historical Provider, MD  SEROQUEL XR 400 MG 24 hr tablet  06/10/13   Historical Provider, MD  zolpidem (AMBIEN) 10 MG tablet  06/10/13   Historical Provider, MD  zonisamide (ZONEGRAN) 100 MG capsule  06/22/13   Historical Provider, MD   BP 125/81 mmHg  Pulse 87  Temp(Src) 98.8 F (37.1 C) (Oral)   Resp 18  SpO2 97% Physical Exam  Constitutional: She appears well-developed and well-nourished. No distress.  HENT:  Head: Normocephalic and atraumatic.  Eyes: Conjunctivae are normal. Pupils are equal, round, and reactive to light. Right eye exhibits no discharge. Left eye exhibits no discharge. No scleral icterus.  Neck: No tracheal deviation present.  Cardiovascular: Normal rate.   Pulmonary/Chest: Effort normal and breath sounds normal. No respiratory distress.  Abdominal: Soft. Bowel sounds are normal. She exhibits no distension and no mass. There is tenderness. There is no rebound and no guarding.  Right upper quadrant and left upper quadrant tenderness (more so on right upper quadrant).  Musculoskeletal: She exhibits no edema.  Lymphadenopathy:    She has no cervical adenopathy.  Neurological: She is alert. Coordination normal.  Skin: Skin is warm and dry. No rash noted. She is not diaphoretic. No erythema.  Psychiatric: She has a normal mood and affect. Her behavior is normal.  Nursing note and vitals reviewed.   ED Course  Procedures  Labs Review Labs Reviewed  COMPREHENSIVE METABOLIC PANEL - Abnormal; Notable for the following:    Calcium 8.6 (*)    All other components within normal limits  CBC WITH DIFFERENTIAL/PLATELET  LIPASE, BLOOD  URINALYSIS, ROUTINE W REFLEX MICROSCOPIC (NOT AT [facility])    Imaging Review Ct Abdomen Pelvis W Contrast  08/16/2015  CLINICAL DATA:  Right upper quadrant pain, bloating, nausea and vomiting for 6 weeks EXAM: CT ABDOMEN AND PELVIS WITH CONTRAST TECHNIQUE: Multidetector CT imaging of the abdomen and pelvis was performed using the standard protocol following bolus administration of intravenous contrast. CONTRAST:  176mL ISOVUE-300 IOPAMIDOL (ISOVUE-300) INJECTION 61% COMPARISON:  01/17/2014 FINDINGS: Lower chest:  The lung bases are unremarkable. Hepatobiliary: The patient is status postcholecystectomy. No focal hepatic mass. No intrahepatic  biliary ductal dilatation. Pancreas: Enhanced pancreas is unremarkable. Spleen: Enhanced spleen is unremarkable. Adrenals/Urinary Tract: No adrenal gland mass. Kidneys are symmetrical in enhancement. No hydronephrosis or hydroureter. Delayed renal images shows bilateral renal symmetrical excretion. Bilateral visualized proximal ureter is unremarkable. Stomach/Bowel: Mild gaseous distension of the stomach with fluid and some gas without evidence of gastric outlet obstruction. Mild diffuse small bowel distension with fluid and some air-fluid levels. Findings highly suspicious for diffuse enteritis or ileus. Less likely partial small bowel obstruction. No transition point in caliber of small bowel. Some liquid stool and gas noted within cecum. Normal appendix is noted in axial image 51. Some colonic stool noted within transverse colon and descending colon. Moderate gas noted in proximal sigmoid colon. Mid sigmoid colon is collapsed small caliber. Some colonic stool noted within rectum. No distal colonic obstruction. Vascular/Lymphatic: No aortic aneurysm. No retroperitoneal or mesenteric adenopathy. Reproductive:  The patient is status post hysterectomy. No pelvic mass is noted. Other: No ascites or free abdominal air. The urinary bladder is unremarkable. Small nonspecific bilateral inguinal lymph nodes are noted. Musculoskeletal: No destructive bony lesions are noted. Mild degenerative changes bilateral SI joints. Sagittal images of the spine shows mild degenerative changes thoracolumbar spine. IMPRESSION: 1. There is mild gaseous distension of the stomach with fluid and gas. No evidence of gastric outlet obstruction. Diffuse mild distension of small bowel with fluid and some air-fluid levels. There is no transition point in caliber of small bowel. Findings highly suspicious for diffuse enteritis or ileus. Less likely early bowel obstruction. Clinical correlation is necessary. Some liquid stool and gas noted within  cecum. Normal appendix. No pericecal inflammation. 2. No colonic obstruction. No evidence of colitis or diverticulitis. 3. No hydronephrosis or hydroureter. 4. Status post hysterectomy. 5. Mild degenerative changes thoracolumbar spine. Electronically Signed   By: Lahoma Crocker M.D.   On: 08/16/2015 11:11   I have personally reviewed and evaluated these images and lab results as part of my medical decision-making.  MDM   Final diagnoses:  Pain of upper abdomen   Patient nontoxic-appearing, vital signs stable. Patient was sent over from primary care provider's office. CT abdomen and pelvis was obtained prior to ED arrival. CBC, lipase, CMP unremarkable. Urinalysis pending. CT demonstrates mild gaseous distention of the stomach with fluid and gas without evidence of gastric outlet obstruction. Diffuse mild distention of small bowel with fluid and some air-fluid levels. Findings are highly suspicious for diffuse enteritis or ileus, less likely early bowel extraction. No colonic obstruction or evidence of colitis or diverticulitis. Patient clinically appearing as a small bowel obstruction. Not actively vomiting right now- will hold off on NG tube. Patient will need to be admitted for bowel rest, antiemetics and pain control. GI consult to and will follow the patient. Hospitalist consulted and advised med-surg bed. Patient in understanding and agreement with the plan.   [place], PA-C 08/16/15 1926  Milton Ferguson, MD 08/16/15 2228

## 2015-08-16 NOTE — ED Notes (Signed)
Pt reports intermittent abd pain for the past 6 weeks. Today she woke up to worsening RUQ pain (gallbladder has already been removed) and emesis. Went to PCP who performed CBC, CMP and CT of abd. Labs were significant for WBC of 12.4 and CT showed enteretis/ileus with a low possiblility of SBO. Pt reports emesis was [place] this am. LBM this am as well. No diarrhea.

## 2015-08-16 NOTE — Progress Notes (Signed)
Nursing Note: Ng placed per orders.Pt tolerated well.Placement confirmed via ascultation w/ Tommie Raymond ,RN and myself.KUB ordered for placement and noted gastric content as tube was advanced.Pt tolerated well.wbb

## 2015-08-16 NOTE — ED Notes (Signed)
Cynthia [name] IV team states unable to collect labs from line as ordered. Phlebotomy made aware.

## 2015-08-16 NOTE — Consult Note (Signed)
[facility] Consult: 5:18 PM 08/16/2015     Referring Provider: Dr Roderic Palau  Primary Care Physician:  Henrine Screws, MD Primary Gastroenterologist:  Dr. Henrene Pastor.      Reason for Consultation:  Abdominal pain.  Ileus and/or enteritis per CT.     HPI: Cynthia [name] is a 50 y.o. female.  Hx diastolic heart failure.  Pericardidits.  Hypothyroidism.  Anxiety/depression.  GERD.   Lap chole and negative IOC 2005.  Endometriosis and menometrorrhagia.  S/p 2004 diagnostic laparoscopy, cautery of endometriosis, hysteroscopy, D & C.  S/p TAHBSO 2004.    01/2012 Colonoscopy .  For hx adenomatous polyps in 2011.  Transverse polyps x 2 (sessile serrated adenoma and hyperplastic)  01/2012 EGD.  For abd pain and weight loss.  Mild esophagitis.  03/2009 Colonoscopy.  Sigmoid polyp.    For 6 weeks having minutes long episodes of RUQ pain, 2 to 3/10 intensity.  Strong sense of bloating.  These have increased in frequency and have begun radiating to scapula.  Anorexia but no nausea or vomiting.  No weight loss.  BMs unchanged, formed about 3 x weekly. 3AM today awakened with intense pain and n/v.  Emesis feculent, not bloody. ~ 7 episodes over 2 to 3 hours. Small Buehrer stool ~ 6 AM.   Went to her PMD and had CT and labs.  Sent to ED based on sxs and CT scan showing mild distention in stomach.  Diffuse mild SB distention with fluid and gas.  No obstruction or GOO.  Findings highly suspicious for diffuse enteritis or ileus. Less likely early bowel obstruction.   Do not have the labs except CBC with WBC of 8.4  Pt has not taken any pain meds or antinauseals.  No PPI.  No sick contacts.  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  Last travelled to [facility] a few weeks ago.    Past Medical History  Diagnosis Date  .  Diastolic congestive heart failure ([place])   . Hyperlipemia   . Hypothyroidism   . History of depression   . History of migraines   . Rosacea   . Anxiety   . Pericarditis   . Bladder disorder   . GERD (gastroesophageal reflux disease)   . Diverticulosis   . Colitis 2006  . Vitamin D deficiency   . Colon polyps   . Kidney stone     Past Surgical History  Procedure Laterality Date  . Total abdominal hysterectomy w/ bilateral salpingoophorectomy    . Cholecystectomy    . Cardiac catheterization  11/15/08    SMOOTH AND NORMAL  . Shoulder surgery      left  . Laparoscopy    . Polypectomy  02/2009    Prior to Admission medications   Medication Sig Start Date End Date Taking? Authorizing Provider  ALPRAZolam Duanne Moron) 0.5 MG tablet Take 0.5 mg by mouth daily as needed. For anxiety    Historical Provider, MD  atorvastatin (LIPITOR) 40 MG tablet  06/13/13   Historical Provider, MD  colchicine  0.6 MG tablet Take 1 tablet (0.6 mg total) by mouth 2 (two) times daily as needed. 07/06/13   Thayer Headings, MD  Est Estrogens-Methyltest (ESTRATEST PO) Take by mouth daily.      Historical Provider, MD  lamoTRIgine (LAMICTAL) 200 MG tablet  06/29/13   Historical Provider, MD  levothyroxine (SYNTHROID, LEVOTHROID) 75 MCG tablet Take 75 mcg by mouth daily.      Historical Provider, MD  metoprolol tartrate (LOPRESSOR) 25 MG tablet TAKE 1 TABLET BY MOUTH 2 TIMES DAILY 01/31/15   Thayer Headings, MD  ondansetron (ZOFRAN ODT) 8 MG disintegrating tablet Take 1 tablet (8 mg total) by mouth every 8 (eight) hours as needed for nausea. 04/28/12   Penni Bombard, MD  predniSONE (DELTASONE) 20 MG tablet Take 20 mg by mouth daily with breakfast.    Historical Provider, MD  rizatriptan (MAXALT) 10 MG tablet Take 10 mg by mouth as needed. May repeat in 2 hours if needed    Historical Provider, MD  SEROQUEL XR 400 MG 24 hr tablet  06/10/13   Historical Provider, MD  zolpidem (AMBIEN) 10 MG tablet  06/10/13   Historical  Provider, MD  zonisamide (ZONEGRAN) 100 MG capsule  06/22/13   Historical Provider, MD    Scheduled Meds:   Infusions:   PRN Meds:    Allergies as of 08/16/2015 - Review Complete 08/16/2015  Allergen Reaction Noted  . Ampicillin  04/10/2010  . Imitrex [sumatriptan]  03/29/2013  . Prednisone  06/30/2013  . Zoloft [sertraline hcl]  03/29/2013    Family History  Problem Relation Age of Onset  . Adopted: Yes    Social History   Social History  . Marital Status: Married    Spouse Name: N/A  . Number of Children: 1 A  . Years of Education: MA   Occupational History  . physicians assistant [employer]   Social History Main Topics  . Smoking status: Never Smoker   . Smokeless tobacco: Never Used  . Alcohol Use: No  . Drug Use: No  . Sexual Activity: Not on file   Other Topics Concern  . Not on file   Social History Narrative   Patient lives at home with her family.   Caffeine Use: quit in 02/2011.    REVIEW OF SYSTEMS: Constitutional:  No weight loss.  No fatigue or weakness until today ENT:  No nose bleeds Pulm:  No SOB or cough CV:  No palpitations, no LE edema.  GU:  No hematuria, no frequency.  Urine output in las 15 hours is less GI:  Per HPI Heme:  No unusual bleeding or bruising   Transfusions:  none Neuro:  No headaches, no peripheral tingling or numbness Derm:  No itching, no rash or sores.  Endocrine:  No sweats or chills.  No polyuria or dysuria Immunization:  Not queried Travel:  None beyond local counties in last few months.    PHYSICAL EXAM: Vital signs in last 24 hours: Filed Vitals:   08/16/15 1255 08/16/15 1530  BP: 122/65 125/81  Pulse: 92 87  Temp: 98.8 F (37.1 C)   Resp: 16 18   Wt Readings from Last 3 Encounters:  07/27/13 101.424 kg (223 lb 9.6 oz)  06/30/13 101.207 kg (223 lb 1.9 oz)  02/09/12 93.895 kg (207 lb)    General: looks somewhat chronically ill.   Head:  No asymmetry or signs of trauma.    Eyes:  No  icterus or pallor Ears:  Not HOH  Nose:  No discharge Mouth:  Clear and moist Neck:  No mass or TMG Lungs:  Clear bil.  Heart: RRR Abdomen:  Soft,  Increased tympany.  Tenderness in right abdomen >> Left abdomen.  No guard or rebound.    Rectal: deferred   Musc/Skeltl: no joint swelling or deformity Extremities:  No CCE  Neurologic:  Oriented x 3.  No tremor.   Skin:  No sores.  Some acne like changes on face. Tattoos:  none   Psych:   A bit depressed, not anxious  Intake/Output from previous day:   Intake/Output this shift:    LAB RESULTS:  Recent Labs  08/16/15 1639  WBC 8.4  HGB 12.8  HCT 39.3  PLT 241   BMET Lab Results  Component Value Date   NA 138 03/22/2011   NA 138 10/25/2008   NA 143 10/24/2008   K 4.0 03/22/2011   K 3.5 10/25/2008   K 3.7 10/24/2008   CL 97 03/22/2011   CL 103 10/25/2008   CL 102 10/24/2008   CO2 31 03/22/2011   CO2 26 10/25/2008   CO2 27 10/24/2008   GLUCOSE 81 03/22/2011   GLUCOSE 109* 10/25/2008   GLUCOSE 146* 10/24/2008   BUN 11 03/22/2011   BUN 7 10/25/2008   BUN 8 10/24/2008   CREATININE 0.84 03/22/2011   CREATININE 0.78 10/25/2008   CREATININE .8 10/24/2008   CALCIUM 9.8 03/22/2011   CALCIUM 8.8 10/25/2008   CALCIUM 9.6 10/24/2008   LFT No results for input(s): PROT, ALBUMIN, AST, ALT, ALKPHOS, BILITOT, BILIDIR, IBILI in the last 72 hours. PT/INR No results found for: INR, PROTIME Hepatitis Panel No results for input(s): HEPBSAG, HCVAB, HEPAIGM, HEPBIGM in the last 72 hours. C-Diff No components found for: CDIFF Lipase  No results found for: LIPASE  Drugs of Abuse  No results found for: LABOPIA, COCAINSCRNUR, LABBENZ, AMPHETMU, THCU, LABBARB   RADIOLOGY STUDIES: Ct Abdomen Pelvis W Contrast  08/16/2015  CLINICAL DATA:  Right upper quadrant pain, bloating, nausea and vomiting for 6 weeks EXAM: CT ABDOMEN AND PELVIS WITH CONTRAST TECHNIQUE: Multidetector CT imaging of the abdomen and pelvis was performed using  the standard protocol following bolus administration of intravenous contrast. CONTRAST:  125mL ISOVUE-300 IOPAMIDOL (ISOVUE-300) INJECTION 61% COMPARISON:  01/17/2014 FINDINGS: Lower chest:  The lung bases are unremarkable. Hepatobiliary: The patient is status postcholecystectomy. No focal hepatic mass. No intrahepatic biliary ductal dilatation. Pancreas: Enhanced pancreas is unremarkable. Spleen: Enhanced spleen is unremarkable. Adrenals/Urinary Tract: No adrenal gland mass. Kidneys are symmetrical in enhancement. No hydronephrosis or hydroureter. Delayed renal images shows bilateral renal symmetrical excretion. Bilateral visualized proximal ureter is unremarkable. Stomach/Bowel: Mild gaseous distension of the stomach with fluid and some gas without evidence of gastric outlet obstruction. Mild diffuse small bowel distension with fluid and some air-fluid levels. Findings highly suspicious for diffuse enteritis or ileus. Less likely partial small bowel obstruction. No transition point in caliber of small bowel. Some liquid stool and gas noted within cecum. Normal appendix is noted in axial image 51. Some colonic stool noted within transverse colon and descending colon. Moderate gas noted in proximal sigmoid colon. Mid sigmoid colon is collapsed small caliber. Some colonic stool noted within rectum. No distal colonic obstruction. Vascular/Lymphatic: No aortic aneurysm. No retroperitoneal or mesenteric adenopathy. Reproductive: The patient is status post hysterectomy. No pelvic mass is noted. Other: No ascites or free abdominal air. The urinary bladder is unremarkable. Small nonspecific bilateral inguinal lymph nodes are noted. Musculoskeletal:  No destructive bony lesions are noted. Mild degenerative changes bilateral SI joints. Sagittal images of the spine shows mild degenerative changes thoracolumbar spine. IMPRESSION: 1. There is mild gaseous distension of the stomach with fluid and gas. No evidence of gastric  outlet obstruction. Diffuse mild distension of small bowel with fluid and some air-fluid levels. There is no transition point in caliber of small bowel. Findings highly suspicious for diffuse enteritis or ileus. Less likely early bowel obstruction. Clinical correlation is necessary. Some liquid stool and gas noted within cecum. Normal appendix. No pericecal inflammation. 2. No colonic obstruction. No evidence of colitis or diverticulitis. 3. No hydronephrosis or hydroureter. 4. Status post hysterectomy. 5. Mild degenerative changes thoracolumbar spine. Electronically Signed   By: Lahoma Crocker M.D.   On: 08/16/2015 11:11    ENDOSCOPIC STUDIES: Per HPI  IMPRESSION:   *  SB ileus, enteritis with ? Early SBO.   Hx pelvic surgery and lap chole per HPI.    *  Hx adenomatous colon polyps, stable constipation. Marland Kitchen     PLAN:     *  Obtain labs: CMET, lipase, U/A all in process. .   *  Admit for IVF, bowel rest, antiemetics and pain control.  Follow up abdominal film in AM. Place NGT if refractory vomiting.   *  IV Pepcid and IVF at 125/hour added.    Azucena Freed  08/16/2015, 5:18 PM Pager: [phone]

## 2015-08-17 ENCOUNTER — Observation Stay (HOSPITAL_COMMUNITY): Payer: 59

## 2015-08-17 DIAGNOSIS — Z87442 Personal history of urinary calculi: Secondary | ICD-10-CM | POA: Diagnosis not present

## 2015-08-17 DIAGNOSIS — F419 Anxiety disorder, unspecified: Secondary | ICD-10-CM | POA: Diagnosis present

## 2015-08-17 DIAGNOSIS — F329 Major depressive disorder, single episode, unspecified: Secondary | ICD-10-CM | POA: Diagnosis present

## 2015-08-17 DIAGNOSIS — I5032 Chronic diastolic (congestive) heart failure: Secondary | ICD-10-CM | POA: Diagnosis present

## 2015-08-17 DIAGNOSIS — Z90710 Acquired absence of both cervix and uterus: Secondary | ICD-10-CM | POA: Diagnosis not present

## 2015-08-17 DIAGNOSIS — Z79899 Other long term (current) drug therapy: Secondary | ICD-10-CM | POA: Diagnosis not present

## 2015-08-17 DIAGNOSIS — R338 Other retention of urine: Secondary | ICD-10-CM | POA: Diagnosis not present

## 2015-08-17 DIAGNOSIS — R339 Retention of urine, unspecified: Secondary | ICD-10-CM | POA: Diagnosis present

## 2015-08-17 DIAGNOSIS — Z888 Allergy status to other drugs, medicaments and biological substances status: Secondary | ICD-10-CM | POA: Diagnosis not present

## 2015-08-17 DIAGNOSIS — E039 Hypothyroidism, unspecified: Secondary | ICD-10-CM | POA: Diagnosis present

## 2015-08-17 DIAGNOSIS — A084 Viral intestinal infection, unspecified: Secondary | ICD-10-CM | POA: Diagnosis present

## 2015-08-17 DIAGNOSIS — Z79890 Hormone replacement therapy (postmenopausal): Secondary | ICD-10-CM | POA: Diagnosis not present

## 2015-08-17 DIAGNOSIS — K210 Gastro-esophageal reflux disease with esophagitis: Secondary | ICD-10-CM | POA: Diagnosis present

## 2015-08-17 DIAGNOSIS — R1011 Right upper quadrant pain: Secondary | ICD-10-CM | POA: Diagnosis present

## 2015-08-17 DIAGNOSIS — Z9049 Acquired absence of other specified parts of digestive tract: Secondary | ICD-10-CM | POA: Diagnosis not present

## 2015-08-17 DIAGNOSIS — K5669 Other intestinal obstruction: Secondary | ICD-10-CM | POA: Diagnosis not present

## 2015-08-17 DIAGNOSIS — G43A1 Cyclical vomiting, intractable: Secondary | ICD-10-CM | POA: Diagnosis present

## 2015-08-17 DIAGNOSIS — Z86010 Personal history of colonic polyps: Secondary | ICD-10-CM | POA: Diagnosis not present

## 2015-08-17 DIAGNOSIS — K5660 Unspecified intestinal obstruction: Secondary | ICD-10-CM | POA: Diagnosis present

## 2015-08-17 DIAGNOSIS — Z880 Allergy status to penicillin: Secondary | ICD-10-CM | POA: Diagnosis not present

## 2015-08-17 DIAGNOSIS — E785 Hyperlipidemia, unspecified: Secondary | ICD-10-CM | POA: Diagnosis present

## 2015-08-17 DIAGNOSIS — R1084 Generalized abdominal pain: Secondary | ICD-10-CM | POA: Diagnosis not present

## 2015-08-17 LAB — COMPREHENSIVE METABOLIC PANEL
ALK PHOS: 127 U/L — AB (ref 38–126)
ALT: 17 U/L (ref 14–54)
AST: 24 U/L (ref 15–41)
Albumin: 3.7 g/dL (ref 3.5–5.0)
Anion gap: 6 (ref 5–15)
BUN: 9 mg/dL (ref 6–20)
CHLORIDE: 108 mmol/L (ref 101–111)
CO2: 24 mmol/L (ref 22–32)
CREATININE: 0.65 mg/dL (ref 0.44–1.00)
Calcium: 8.2 mg/dL — ABNORMAL LOW (ref 8.9–10.3)
GFR calc Af Amer: >60 mL/min (ref >60)
Glucose, Bld: 96 mg/dL (ref 65–99)
Potassium: 3.8 mmol/L (ref 3.5–5.1)
Sodium: 138 mmol/L (ref 135–145)
Total Bilirubin: 1 mg/dL (ref 0.3–1.2)
Total Protein: 7 g/dL (ref 6.5–8.1)

## 2015-08-17 LAB — CBC WITH DIFFERENTIAL/PLATELET
BASOS ABS: 0 10*3/uL (ref 0.0–0.1)
Basophils Relative: 0 %
EOS PCT: 4 %
Eosinophils Absolute: 0.3 10*3/uL (ref 0.0–0.7)
HEMATOCRIT: 38 % (ref 36.0–46.0)
HEMOGLOBIN: 12.3 g/dL (ref 12.0–15.0)
LYMPHS ABS: 1.2 10*3/uL (ref 0.7–4.0)
LYMPHS PCT: 16 %
MCH: 30.3 pg (ref 26.0–34.0)
MCHC: 32.4 g/dL (ref 30.0–36.0)
MCV: 93.6 fL (ref 78.0–100.0)
Monocytes Absolute: 0.5 10*3/uL (ref 0.1–1.0)
Monocytes Relative: 7 %
NEUTROS ABS: 5.3 10*3/uL (ref 1.7–7.7)
NEUTROS PCT: 73 %
PLATELETS: 252 10*3/uL (ref 150–400)
RBC: 4.06 MIL/uL (ref 3.87–5.11)
RDW: 13.7 % (ref 11.5–15.5)
WBC: 7.3 10*3/uL (ref 4.0–10.5)

## 2015-08-17 MED ORDER — KETOROLAC TROMETHAMINE 30 MG/ML IJ SOLN
30.0000 mg | Freq: Four times a day (QID) | INTRAMUSCULAR | Status: DC | PRN
Start: 2015-08-17 — End: 2015-08-19
  Administered 2015-08-17: 30 mg via INTRAVENOUS
  Filled 2015-08-17: qty 1

## 2015-08-17 MED ORDER — CHLORHEXIDINE GLUCONATE 0.12 % MT SOLN
15.0000 mL | Freq: Two times a day (BID) | OROMUCOSAL | Status: DC
  Administered 2015-08-17 – 2015-08-18 (×3): 15 mL via OROMUCOSAL
  Filled 2015-08-17 (×3): qty 15

## 2015-08-17 MED ORDER — BISACODYL 10 MG RE SUPP
10.0000 mg | Freq: Once | RECTAL | Status: AC
  Administered 2015-08-17: 10 mg via RECTAL
  Filled 2015-08-17: qty 1

## 2015-08-17 MED ORDER — CETYLPYRIDINIUM CHLORIDE 0.05 % MT LIQD
7.0000 mL | Freq: Two times a day (BID) | OROMUCOSAL | Status: DC
  Administered 2015-08-17 – 2015-08-18 (×3): 7 mL via OROMUCOSAL

## 2015-08-17 MED ORDER — METOCLOPRAMIDE HCL 5 MG/ML IJ SOLN
5.0000 mg | Freq: Four times a day (QID) | INTRAMUSCULAR | Status: AC
  Administered 2015-08-17 – 2015-08-19 (×8): 5 mg via INTRAVENOUS
  Filled 2015-08-17 (×8): qty 2

## 2015-08-17 MED ORDER — ENOXAPARIN SODIUM 40 MG/0.4ML ~~LOC~~ SOLN
40.0000 mg | SUBCUTANEOUS | Status: DC
  Administered 2015-08-17 – 2015-08-18 (×2): 40 mg via SUBCUTANEOUS
  Filled 2015-08-17 (×2): qty 0.4

## 2015-08-17 MED ORDER — LEVOTHYROXINE SODIUM 100 MCG IV SOLR
37.5000 ug | Freq: Every day | INTRAVENOUS | Status: DC
  Administered 2015-08-17 – 2015-08-18 (×2): 37.5 ug via INTRAVENOUS
  Filled 2015-08-17 (×2): qty 5

## 2015-08-17 MED ORDER — MENTHOL 3 MG MT LOZG
1.0000 | LOZENGE | OROMUCOSAL | Status: DC | PRN
  Filled 2015-08-17: qty 9

## 2015-08-17 NOTE — Progress Notes (Signed)
Daily Rounding Note  08/17/2015, 9:58 AM    SUBJECTIVE:       Chief complaint: nausea, some emesis despite presence of NGT.  Sore throat.  Note urinary retention: 1200 cc post void residual.  Foley not in place  NGT placed.  200 cc output recorded so far.  No flatus or stool.  She is still having nausea and some emesis  OBJECTIVE:         Vital signs in last 24 hours:    Temp:  [98 F (36.7 C)-98.9 F (37.2 C)] 98 F (36.7 C) (07/14 0654) Pulse Rate:  [83-92] 89 (07/14 0654) Resp:  [16-18] 17 (07/14 0654) BP: (98-130)/(65-85) 130/85 mmHg (07/14 0654) SpO2:  [94 %-97 %] 95 % (07/14 0654) Weight:  [101.606 kg (224 lb)] 101.606 kg (224 lb) (07/13 1948) Last BM Date: 08/14/15 Filed Weights   08/16/15 1948  Weight: 101.606 kg (224 lb)   General: looks unwell and depressed   Heart: RRR.   Chest: clear bil.  No cough.  Sounds congested with the NGT in place Abdomen: NGT output is deep green.  BS quiet.  Not much tenderness but says belly feels sore to palpation  Extremities: no CCE Neuro/Psych:  Pleasant but depressed, fully oriented and alert, no gross deficits.   Intake/Output from previous day: 07/13 0701 - 07/14 0700 In: 1500 [I.V.:1500] Out: -   Intake/Output this shift: Total I/O In: -  Out: 2600 [Urine:2400; Emesis/NG output:200]  Lab Results:  Recent Labs  08/16/15 1639 08/17/15 0352  WBC 8.4 7.3  HGB 12.8 12.3  HCT 39.3 38.0  PLT 241 252   BMET  Recent Labs  08/16/15 1639 08/17/15 0352  NA 139 138  K 3.5 3.8  CL 105 108  CO2 25 24  GLUCOSE 78 96  BUN 8 9  CREATININE 0.63 0.65  CALCIUM 8.6* 8.2*   LFT  Recent Labs  08/16/15 1639 08/17/15 0352  PROT 7.6 7.0  ALBUMIN 4.1 3.7  AST 28 24  ALT 19 17  ALKPHOS 126 127*  BILITOT 0.7 1.0   PT/INR No results for input(s): LABPROT, INR in the last 72 hours. Hepatitis Panel No results for input(s): HEPBSAG, HCVAB, HEPAIGM, HEPBIGM  in the last 72 hours.  Studies/Results: Dg Abd 1 View  08/17/2015  CLINICAL DATA:  Nausea and vomiting EXAM: ABDOMEN - 1 VIEW COMPARISON:  08/16/2015 FINDINGS: Previously seen nasogastric catheter is not well appreciated on this exam. Scattered large and small bowel gas is noted. No free air is seen. No obstructive changes are noted. Fecal material is noted throughout the colon stable from the prior exam. IMPRESSION: No acute abnormality noted. Electronically Signed   By: Inez Catalina M.D.   On: 08/17/2015 09:39   Ct Abdomen Pelvis W Contrast  08/16/2015  CLINICAL DATA:  Right upper quadrant pain, bloating, nausea and vomiting for 6 weeks EXAM: CT ABDOMEN AND PELVIS WITH CONTRAST TECHNIQUE: Multidetector CT imaging of the abdomen and pelvis was performed using the standard protocol following bolus administration of intravenous contrast. CONTRAST:  174mL ISOVUE-300 IOPAMIDOL (ISOVUE-300) INJECTION 61% COMPARISON:  01/17/2014 FINDINGS: Lower chest:  The lung bases are unremarkable. Hepatobiliary: The patient is status postcholecystectomy. No focal hepatic mass. No intrahepatic biliary ductal dilatation. Pancreas: Enhanced pancreas is unremarkable. Spleen: Enhanced spleen is unremarkable. Adrenals/Urinary Tract: No adrenal gland mass. Kidneys are symmetrical in enhancement. No hydronephrosis or hydroureter. Delayed renal images shows bilateral renal symmetrical excretion. Bilateral visualized  proximal ureter is unremarkable. Stomach/Bowel: Mild gaseous distension of the stomach with fluid and some gas without evidence of gastric outlet obstruction. Mild diffuse small bowel distension with fluid and some air-fluid levels. Findings highly suspicious for diffuse enteritis or ileus. Less likely partial small bowel obstruction. No transition point in caliber of small bowel. Some liquid stool and gas noted within cecum. Normal appendix is noted in axial image 51. Some colonic stool noted within transverse colon and  descending colon. Moderate gas noted in proximal sigmoid colon. Mid sigmoid colon is collapsed small caliber. Some colonic stool noted within rectum. No distal colonic obstruction. Vascular/Lymphatic: No aortic aneurysm. No retroperitoneal or mesenteric adenopathy. Reproductive: The patient is status post hysterectomy. No pelvic mass is noted. Other: No ascites or free abdominal air. The urinary bladder is unremarkable. Small nonspecific bilateral inguinal lymph nodes are noted. Musculoskeletal: No destructive bony lesions are noted. Mild degenerative changes bilateral SI joints. Sagittal images of the spine shows mild degenerative changes thoracolumbar spine. IMPRESSION: 1. There is mild gaseous distension of the stomach with fluid and gas. No evidence of gastric outlet obstruction. Diffuse mild distension of small bowel with fluid and some air-fluid levels. There is no transition point in caliber of small bowel. Findings highly suspicious for diffuse enteritis or ileus. Less likely early bowel obstruction. Clinical correlation is necessary. Some liquid stool and gas noted within cecum. Normal appendix. No pericecal inflammation. 2. No colonic obstruction. No evidence of colitis or diverticulitis. 3. No hydronephrosis or hydroureter. 4. Status post hysterectomy. 5. Mild degenerative changes thoracolumbar spine. Electronically Signed   By: Lahoma Crocker M.D.   On: 08/16/2015 11:11   Dg Abd Portable 1v  08/16/2015  CLINICAL DATA:  Nasogastric tube placement at bedside. EXAM: PORTABLE ABDOMEN - 1 VIEW 9:33 p.m.: COMPARISON:  CT abdomen and pelvis performed earlier today at 10:51 a.m. FINDINGS: Nasogastric tube looped in the stomach with tip in the fundus. Bowel gas pattern unremarkable without evidence of obstruction or significant ileus. Gas within upper normal caliber sigmoid colon. Moderate stool burden in the colon. Contrast material within the urinary bladder from the CT earlier today. Surgical clips in the right  upper quadrant prior cholecystectomy. IMPRESSION: 1. Nasogastric tube looped in the stomach with its tip in the fundus. 2. No acute abdominal abnormality.  Moderate colonic stool burden. Electronically Signed   By: Evangeline Dakin M.D.   On: 08/16/2015 21:52    ASSESMENT:   * ? Early SBO va ileus vs diffuse enteritis.  NGT in place but still feels nauseated.  Hx pelvic surgeries and lap chole.  *  Urinary retention.    * Hx adenomatous colon polyps, stable constipation.    PLAN   *  Await this AMs KUB.  Dr Loletha Carrow will pull NGT out a bit as it is looping up into top of stomach.  If no signs of SBO on this AMs film, add Reglan, if SBO: Reglan not advisable.   Added prn Cepacol lozenges.  Dr Clementeen Graham will address conversion of necessary po meds to IV forms.      Cynthia [name]  08/17/2015, 9:58 AM  Pager: [phone]  Addendum 10 AM: unremarkable KUB.  Will initiate Reglan. Will give Dulcolax PR and remove NGT.

## 2015-08-17 NOTE — Progress Notes (Signed)
PROGRESS NOTE                                                                                                                                                                                                             Patient Demographics:    Cynthia [name], is a 50 y.o. female, DOB - 1965-10-20, [id number]  Admit date - 08/16/2015   Admitting Physician Reubin Milan, MD  Outpatient Primary MD for the patient is Henrine Screws, MD  LOS -   Outpatient Specialists: Dr Henrene Pastor  Chief Complaint  Patient presents with  . Abdominal Pain  . Emesis       Brief Narrative   50 year old female with history of hypothyroidism, depression, migraines, anxiety, GERD, diverticulosis with history of colon polyps presented to the ED with intermittent abdominal pain for past 6 weeks worsened on the day of admission associated with bloating sensation and multiple episodes of vomiting. CT abdomen suggestive of enteritis however clinically appeared to have partial small bowel obstruction and admitted for conservative management. [facility] following.   Subjective:   Patient reports nausea and bloating overnight had urinary retention with about 200 mL urine on bladder scan.   Assessment  & Plan :    Principal Problem:  Partial SBO (small bowel obstruction) (HCC) No abnormality noted on follow-up x-ray. Will discontinue NG. Place her on low-dose Reglan. CT also showed moderate stool burden. Order Dulcolax suppository. Appreciate GI evaluation. Clinical signs or symptom of infection. Labs unremarkable Supportive care with pain medications and Pepcid  Active Problems:   Diastolic congestive heart failure ([place]) Euvolemic.     Hypothyroidism Cystoscopy IV Synthroid    History of anxiety and depression Hold home medication while patient is nothing by mouth      Code Status : Full code  Family Communication  : None at  bedside  Disposition Plan  : Home once clinically improved possibly in the next 48 hours  Barriers For Discharge : Active symptoms  Consults  :  [facility]  Procedures  : CT abdomen and pelvis  DVT Prophylaxis  :  Lovenox -   Lab Results  Component Value Date   PLT 252 08/17/2015    Antibiotics  :    Anti-infectives    None        Objective:   Filed Vitals:  08/16/15 1850 08/16/15 1948 08/16/15 2105 08/17/15 0654  BP: 114/74 104/69 116/73 130/85  Pulse: 86 88 83 89  Temp: 98.6 F (37 C) 98.5 F (36.9 C) 98.9 F (37.2 C) 98 F (36.7 C)  TempSrc: Oral Oral Oral Oral  Resp: 16 17 16 17   Height:  5\' 8"  (1.727 m)    Weight:  101.606 kg (224 lb)    SpO2: 94% 97% 97% 95%    Wt Readings from Last 3 Encounters:  08/16/15 101.606 kg (224 lb)  07/27/13 101.424 kg (223 lb 9.6 oz)  06/30/13 101.207 kg (223 lb 1.9 oz)     Intake/Output Summary (Last 24 hours) at 08/17/15 1322 Last data filed at 08/17/15 0710  Gross per 24 hour  Intake   1500 ml  Output   2600 ml  Net  -1100 ml     Physical Exam  Gen: Appears fatigued HEENT: no pallor, dry mucosa, supple neck Chest: clear b/l, no added sounds CVS: N S1&S2, no murmurs, rubs or gallop GI: soft, nondistended, mild bilateral mid quadrant tenderness, bowel sounds present Musculoskeletal: warm, no edema CNS: Alert and oriented    Data Review:    CBC  Recent Labs Lab 08/16/15 1639 08/17/15 0352  WBC 8.4 7.3  HGB 12.8 12.3  HCT 39.3 38.0  PLT 241 252  MCV 94.0 93.6  MCH 30.6 30.3  MCHC 32.6 32.4  RDW 13.3 13.7  LYMPHSABS 1.6 1.2  MONOABS 0.6 0.5  EOSABS 0.3 0.3  BASOSABS 0.0 0.0    Chemistries   Recent Labs Lab 08/16/15 1639 08/17/15 0352  NA 139 138  K 3.5 3.8  CL 105 108  CO2 25 24  GLUCOSE 78 96  BUN 8 9  CREATININE 0.63 0.65  CALCIUM 8.6* 8.2*  MG 1.9  --   AST 28 24  ALT 19 17  ALKPHOS 126 127*  BILITOT 0.7 1.0    ------------------------------------------------------------------------------------------------------------------ No results for input(s): CHOL, HDL, LDLCALC, TRIG, CHOLHDL, LDLDIRECT in the last 72 hours.  No results found for: HGBA1C ------------------------------------------------------------------------------------------------------------------ No results for input(s): TSH, T4TOTAL, T3FREE, THYROIDAB in the last 72 hours.  Invalid input(s): FREET3 ------------------------------------------------------------------------------------------------------------------ No results for input(s): VITAMINB12, FOLATE, FERRITIN, TIBC, IRON, RETICCTPCT in the last 72 hours.  Coagulation profile No results for input(s): INR, PROTIME in the last 168 hours.  No results for input(s): DDIMER in the last 72 hours.  Cardiac Enzymes No results for input(s): CKMB, TROPONINI, MYOGLOBIN in the last 168 hours.  Invalid input(s): CK ------------------------------------------------------------------------------------------------------------------ No results found for: BNP  Inpatient Medications  Scheduled Meds: . antiseptic oral rinse  7 mL Mouth Rinse q12n4p  . chlorhexidine  15 mL Mouth Rinse BID  . famotidine (PEPCID) IV  20 mg Intravenous Q12H  . levothyroxine  37.5 mcg Intravenous Daily  . metoCLOPramide (REGLAN) injection  5 mg Intravenous Q6H   Continuous Infusions: . 0.9 % NaCl with KCl 20 mEq / L 1,000 mL (08/17/15 0519)   PRN Meds:.LORazepam, menthol-cetylpyridinium, morphine injection, ondansetron (ZOFRAN) IV  Micro Results No results found for this or any previous visit (from the past 240 hour(s)).  Radiology Reports Dg Abd 1 View  08/17/2015  CLINICAL DATA:  Nausea and vomiting EXAM: ABDOMEN - 1 VIEW COMPARISON:  08/16/2015 FINDINGS: Previously seen nasogastric catheter is not well appreciated on this exam. Scattered large and small bowel gas is noted. No free air is seen. No  obstructive changes are noted. Fecal material is noted throughout the colon stable from the prior  exam. IMPRESSION: No acute abnormality noted. Electronically Signed   By: Inez Catalina M.D.   On: 08/17/2015 09:39   Ct Abdomen Pelvis W Contrast  08/16/2015  CLINICAL DATA:  Right upper quadrant pain, bloating, nausea and vomiting for 6 weeks EXAM: CT ABDOMEN AND PELVIS WITH CONTRAST TECHNIQUE: Multidetector CT imaging of the abdomen and pelvis was performed using the standard protocol following bolus administration of intravenous contrast. CONTRAST:  166mL ISOVUE-300 IOPAMIDOL (ISOVUE-300) INJECTION 61% COMPARISON:  01/17/2014 FINDINGS: Lower chest:  The lung bases are unremarkable. Hepatobiliary: The patient is status postcholecystectomy. No focal hepatic mass. No intrahepatic biliary ductal dilatation. Pancreas: Enhanced pancreas is unremarkable. Spleen: Enhanced spleen is unremarkable. Adrenals/Urinary Tract: No adrenal gland mass. Kidneys are symmetrical in enhancement. No hydronephrosis or hydroureter. Delayed renal images shows bilateral renal symmetrical excretion. Bilateral visualized proximal ureter is unremarkable. Stomach/Bowel: Mild gaseous distension of the stomach with fluid and some gas without evidence of gastric outlet obstruction. Mild diffuse small bowel distension with fluid and some air-fluid levels. Findings highly suspicious for diffuse enteritis or ileus. Less likely partial small bowel obstruction. No transition point in caliber of small bowel. Some liquid stool and gas noted within cecum. Normal appendix is noted in axial image 51. Some colonic stool noted within transverse colon and descending colon. Moderate gas noted in proximal sigmoid colon. Mid sigmoid colon is collapsed small caliber. Some colonic stool noted within rectum. No distal colonic obstruction. Vascular/Lymphatic: No aortic aneurysm. No retroperitoneal or mesenteric adenopathy. Reproductive: The patient is status post  hysterectomy. No pelvic mass is noted. Other: No ascites or free abdominal air. The urinary bladder is unremarkable. Small nonspecific bilateral inguinal lymph nodes are noted. Musculoskeletal: No destructive bony lesions are noted. Mild degenerative changes bilateral SI joints. Sagittal images of the spine shows mild degenerative changes thoracolumbar spine. IMPRESSION: 1. There is mild gaseous distension of the stomach with fluid and gas. No evidence of gastric outlet obstruction. Diffuse mild distension of small bowel with fluid and some air-fluid levels. There is no transition point in caliber of small bowel. Findings highly suspicious for diffuse enteritis or ileus. Less likely early bowel obstruction. Clinical correlation is necessary. Some liquid stool and gas noted within cecum. Normal appendix. No pericecal inflammation. 2. No colonic obstruction. No evidence of colitis or diverticulitis. 3. No hydronephrosis or hydroureter. 4. Status post hysterectomy. 5. Mild degenerative changes thoracolumbar spine. Electronically Signed   By: Lahoma Crocker M.D.   On: 08/16/2015 11:11   Dg Abd Portable 1v  08/16/2015  CLINICAL DATA:  Nasogastric tube placement at bedside. EXAM: PORTABLE ABDOMEN - 1 VIEW 9:33 p.m.: COMPARISON:  CT abdomen and pelvis performed earlier today at 10:51 a.m. FINDINGS: Nasogastric tube looped in the stomach with tip in the fundus. Bowel gas pattern unremarkable without evidence of obstruction or significant ileus. Gas within upper normal caliber sigmoid colon. Moderate stool burden in the colon. Contrast material within the urinary bladder from the CT earlier today. Surgical clips in the right upper quadrant prior cholecystectomy. IMPRESSION: 1. Nasogastric tube looped in the stomach with its tip in the fundus. 2. No acute abdominal abnormality.  Moderate colonic stool burden. Electronically Signed   By: Evangeline Dakin M.D.   On: 08/16/2015 21:52    Time Spent in minutes  25   Louellen Molder M.D on 08/17/2015 at 1:22 PM  Between 7am to 7pm - Pager - [phone]  After 7pm go to [email or web address] - password [email or web address]  [facility] -  Office  [phone]      

## 2015-08-17 NOTE — Progress Notes (Signed)
Patient unable to void, bladder scan showing 450 cc of urine in the bladder,Dr Dhungel notified,orders received to insert foley catheter.

## 2015-08-17 NOTE — Care Management Note (Signed)
Case Management Note  Patient Details  Name: Cynthia [name] MRN: [id number] Date of Birth: [birth year]  Subjective/Objective:   50 yo admitted with SBO. NGT and NPO                 Action/Plan: Pt from home with family. Chart reviewed and no CM needs identified or communicated at this time. CM will continue to follow along.  Expected Discharge Date:   (unknown)               Expected Discharge Plan:  Home/Self Care  In-House Referral:     Discharge planning Services  CM Consult  Post Acute Care Choice:    Choice offered to:     DME Arranged:    DME Agency:     HH Arranged:    HH Agency:     Status of Service:  In process, will continue to follow  If discussed at Long Length of Stay Meetings, dates discussed:    Additional CommentsLynnell Catalan, RN 08/17/2015, 12:26 PM  [phone]

## 2015-08-17 NOTE — Progress Notes (Signed)
Nursing Note: Pt arrived via stretcher and able to step stand and get in bed.Pt awake,alert and oriented x4 w/ c/o abd pain ad nausea for past few days,weeks.Pt has rash to her r lower outer leg,not new,and has seen a dermatologist for and has a flat ,fred rash to her upper back.Pt aware that an NG tube must be placed.Pt consents.wbb

## 2015-08-17 NOTE — Progress Notes (Signed)
Nursing Note: Pt unable to void. Scanned for 999 ml.A: Pt in and out cathed per policy for 0000000 cc of clear yellow urine. Pt did not feel the urge at all.wbb

## 2015-08-18 ENCOUNTER — Inpatient Hospital Stay (HOSPITAL_COMMUNITY): Payer: 59

## 2015-08-18 DIAGNOSIS — R338 Other retention of urine: Secondary | ICD-10-CM

## 2015-08-18 DIAGNOSIS — K56600 Partial intestinal obstruction, unspecified as to cause: Secondary | ICD-10-CM | POA: Diagnosis present

## 2015-08-18 LAB — BASIC METABOLIC PANEL
ANION GAP: 9 (ref 5–15)
BUN: 10 mg/dL (ref 6–20)
CALCIUM: 8.5 mg/dL — AB (ref 8.9–10.3)
CO2: 22 mmol/L (ref 22–32)
Chloride: 108 mmol/L (ref 101–111)
Creatinine, Ser: 0.57 mg/dL (ref 0.44–1.00)
Glucose, Bld: 83 mg/dL (ref 65–99)
Potassium: 4.2 mmol/L (ref 3.5–5.1)
SODIUM: 139 mmol/L (ref 135–145)

## 2015-08-18 MED ORDER — MORPHINE SULFATE (PF) 2 MG/ML IV SOLN: 1.0000 mg | INTRAVENOUS | Status: DC | PRN

## 2015-08-18 MED ORDER — RANITIDINE HCL 150 MG/10ML PO SYRP
150.0000 mg | ORAL_SOLUTION | Freq: Two times a day (BID) | ORAL | Status: DC
  Administered 2015-08-18 – 2015-08-19 (×2): 150 mg via ORAL
  Filled 2015-08-18 (×3): qty 10

## 2015-08-18 MED ORDER — QUETIAPINE FUMARATE ER 300 MG PO TB24: 300.0000 mg | ORAL_TABLET | Freq: Every day | ORAL | Status: DC

## 2015-08-18 MED ORDER — QUETIAPINE FUMARATE ER 300 MG PO TB24
300.0000 mg | ORAL_TABLET | Freq: Every day | ORAL | Status: DC
  Administered 2015-08-18: 300 mg via ORAL
  Filled 2015-08-18: qty 1

## 2015-08-18 MED ORDER — ATORVASTATIN CALCIUM 40 MG PO TABS
40.0000 mg | ORAL_TABLET | Freq: Every day | ORAL | Status: DC
  Filled 2015-08-18: qty 1

## 2015-08-18 MED ORDER — LEVOTHYROXINE SODIUM 50 MCG PO TABS
75.0000 ug | ORAL_TABLET | Freq: Every day | ORAL | Status: DC
  Administered 2015-08-18 – 2015-08-19 (×2): 75 ug via ORAL
  Filled 2015-08-18 (×2): qty 1

## 2015-08-18 MED ORDER — LAMOTRIGINE 200 MG PO TABS
200.0000 mg | ORAL_TABLET | Freq: Every day | ORAL | Status: DC
  Administered 2015-08-18: 200 mg via ORAL
  Filled 2015-08-18: qty 1

## 2015-08-18 MED ORDER — METOPROLOL TARTRATE 25 MG PO TABS
25.0000 mg | ORAL_TABLET | Freq: Two times a day (BID) | ORAL | Status: DC
  Administered 2015-08-18 – 2015-08-19 (×3): 25 mg via ORAL
  Filled 2015-08-18 (×3): qty 1

## 2015-08-18 MED ORDER — BISACODYL 10 MG RE SUPP
10.0000 mg | Freq: Four times a day (QID) | RECTAL | Status: AC
  Administered 2015-08-18: 10 mg via RECTAL
  Filled 2015-08-18: qty 1

## 2015-08-18 MED ORDER — FAMOTIDINE 40 MG/5ML PO SUSR: 20.0000 mg | Freq: Two times a day (BID) | ORAL | Status: DC

## 2015-08-18 MED ORDER — ZOLPIDEM TARTRATE 10 MG PO TABS: 10.0000 mg | ORAL_TABLET | Freq: Every evening | ORAL | Status: DC | PRN

## 2015-08-18 MED ORDER — ALPRAZOLAM 0.5 MG PO TABS: 0.5000 mg | ORAL_TABLET | Freq: Every evening | ORAL | Status: DC | PRN

## 2015-08-18 NOTE — Progress Notes (Signed)
PROGRESS NOTE                                                                                                                                                                                                             Patient Demographics:    Cynthia [name], is a 50 y.o. female, DOB - Mar 17, 1965, [id number]  Admit date - 08/16/2015   Admitting Physician Reubin Milan, MD  Outpatient Primary MD for the patient is Henrine Screws, MD  LOS - 1  Outpatient Specialists: Dr Henrene Pastor  Chief Complaint  Patient presents with  . Abdominal Pain  . Emesis       Brief Narrative   50 year old female with history of hypothyroidism, depression, migraines, anxiety, GERD, diverticulosis with history of colon polyps presented to the ED with intermittent abdominal pain for past 6 weeks worsened on the day of admission associated with bloating sensation and multiple episodes of vomiting. CT abdomen suggestive of enteritis however clinically appeared to have partial small bowel obstruction and admitted for conservative management. [facility] following.   Subjective:   Reports nausea and bloating. Had an episode of vomiting last night.   Assessment  & Plan :    Principal Problem:  Partial SBO (small bowel obstruction) (HCC) Follow-up abdominal x-ray yesterday and today does not show small bowel obstruction. NG tube removed yesterday. Continue scheduled Reglan. Start clears. Minimize narcotics. Encourage ambulation.   Active Problems:  Acute urinary retention CT abdomen on admission without any obstructive findings. Foley placed yesterday. Will attempt voiding trial tomorrow morning.    Diastolic congestive heart failure (HCC) Euvolemic.     Hypothyroidism Resume oral Synthroid.    History of anxiety and depression Resume home medications.      Code Status : Full code  Family Communication  : None at  bedside  Disposition Plan  : Home once clinically improved possibly in the next 48 hours  Barriers For Discharge : Active symptoms  Consults  :  [facility]  Procedures  : CT abdomen and pelvis  DVT Prophylaxis  :  Lovenox -   Lab Results  Component Value Date   PLT 252 08/17/2015    Antibiotics  :    Anti-infectives    None        Objective:   Filed Vitals:   08/17/15 0654 08/17/15 1600  08/17/15 2022 08/18/15 0513  BP: 130/85 122/77 122/74 120/72  Pulse: 89 96 88 88  Temp: 98 F (36.7 C) 98.4 F (36.9 C) 98.9 F (37.2 C) 98.6 F (37 C)  TempSrc: Oral Oral Oral Oral  Resp: 17 16 16 15   Height:      Weight:      SpO2: 95% 95% 94% 95%    Wt Readings from Last 3 Encounters:  08/16/15 101.606 kg (224 lb)  07/27/13 101.424 kg (223 lb 9.6 oz)  06/30/13 101.207 kg (223 lb 1.9 oz)     Intake/Output Summary (Last 24 hours) at 08/18/15 1159 Last data filed at 08/18/15 0547  Gross per 24 hour  Intake 4325.7 ml  Output    550 ml  Net 3775.7 ml     Physical Exam  Gen: Appears fatigued HEENT: dry mucosa, supple neck Chest: clear b/l, no added sounds CVS: N S1&S2, no murmurs, rubs or gallop GI: soft, nondistended, Minimal midabdominal tenderness, bowel sounds present, foley+ Musculoskeletal: warm, no edema     Data Review:    CBC  Recent Labs Lab 08/16/15 1639 08/17/15 0352  WBC 8.4 7.3  HGB 12.8 12.3  HCT 39.3 38.0  PLT 241 252  MCV 94.0 93.6  MCH 30.6 30.3  MCHC 32.6 32.4  RDW 13.3 13.7  LYMPHSABS 1.6 1.2  MONOABS 0.6 0.5  EOSABS 0.3 0.3  BASOSABS 0.0 0.0    Chemistries   Recent Labs Lab 08/16/15 1639 08/17/15 0352 08/18/15 0354  NA 139 138 139  K 3.5 3.8 4.2  CL 105 108 108  CO2 25 24 22   GLUCOSE 78 96 83  BUN 8 9 10   CREATININE 0.63 0.65 0.57  CALCIUM 8.6* 8.2* 8.5*  MG 1.9  --   --   AST 28 24  --   ALT 19 17  --   ALKPHOS 126 127*  --   BILITOT 0.7 1.0  --     ------------------------------------------------------------------------------------------------------------------ No results for input(s): CHOL, HDL, LDLCALC, TRIG, CHOLHDL, LDLDIRECT in the last 72 hours.  No results found for: HGBA1C ------------------------------------------------------------------------------------------------------------------ No results for input(s): TSH, T4TOTAL, T3FREE, THYROIDAB in the last 72 hours.  Invalid input(s): FREET3 ------------------------------------------------------------------------------------------------------------------ No results for input(s): VITAMINB12, FOLATE, FERRITIN, TIBC, IRON, RETICCTPCT in the last 72 hours.  Coagulation profile No results for input(s): INR, PROTIME in the last 168 hours.  No results for input(s): DDIMER in the last 72 hours.  Cardiac Enzymes No results for input(s): CKMB, TROPONINI, MYOGLOBIN in the last 168 hours.  Invalid input(s): CK ------------------------------------------------------------------------------------------------------------------ No results found for: BNP  Inpatient Medications  Scheduled Meds: . antiseptic oral rinse  7 mL Mouth Rinse q12n4p  . bisacodyl  10 mg Rectal Q6H  . chlorhexidine  15 mL Mouth Rinse BID  . enoxaparin (LOVENOX) injection  40 mg Subcutaneous Q24H  . levothyroxine  37.5 mcg Intravenous Daily  . metoCLOPramide (REGLAN) injection  5 mg Intravenous Q6H  . QUEtiapine  300 mg Oral QHS  . ranitidine  150 mg Oral BID   Continuous Infusions: . 0.9 % NaCl with KCl 20 mEq / L 125 mL/hr at 08/18/15 0616   PRN Meds:.ketorolac, menthol-cetylpyridinium, morphine injection, ondansetron (ZOFRAN) IV  Micro Results No results found for this or any previous visit (from the past 240 hour(s)).  Radiology Reports Dg Abd 1 View  08/17/2015  CLINICAL DATA:  Nausea and vomiting EXAM: ABDOMEN - 1 VIEW COMPARISON:  08/16/2015 FINDINGS: Previously seen nasogastric catheter is not  well appreciated  on this exam. Scattered large and small bowel gas is noted. No free air is seen. No obstructive changes are noted. Fecal material is noted throughout the colon stable from the prior exam. IMPRESSION: No acute abnormality noted. Electronically Signed   By: Inez Catalina M.D.   On: 08/17/2015 09:39   Ct Abdomen Pelvis W Contrast  08/16/2015  CLINICAL DATA:  Right upper quadrant pain, bloating, nausea and vomiting for 6 weeks EXAM: CT ABDOMEN AND PELVIS WITH CONTRAST TECHNIQUE: Multidetector CT imaging of the abdomen and pelvis was performed using the standard protocol following bolus administration of intravenous contrast. CONTRAST:  141mL ISOVUE-300 IOPAMIDOL (ISOVUE-300) INJECTION 61% COMPARISON:  01/17/2014 FINDINGS: Lower chest:  The lung bases are unremarkable. Hepatobiliary: The patient is status postcholecystectomy. No focal hepatic mass. No intrahepatic biliary ductal dilatation. Pancreas: Enhanced pancreas is unremarkable. Spleen: Enhanced spleen is unremarkable. Adrenals/Urinary Tract: No adrenal gland mass. Kidneys are symmetrical in enhancement. No hydronephrosis or hydroureter. Delayed renal images shows bilateral renal symmetrical excretion. Bilateral visualized proximal ureter is unremarkable. Stomach/Bowel: Mild gaseous distension of the stomach with fluid and some gas without evidence of gastric outlet obstruction. Mild diffuse small bowel distension with fluid and some air-fluid levels. Findings highly suspicious for diffuse enteritis or ileus. Less likely partial small bowel obstruction. No transition point in caliber of small bowel. Some liquid stool and gas noted within cecum. Normal appendix is noted in axial image 51. Some colonic stool noted within transverse colon and descending colon. Moderate gas noted in proximal sigmoid colon. Mid sigmoid colon is collapsed small caliber. Some colonic stool noted within rectum. No distal colonic obstruction. Vascular/Lymphatic: No aortic  aneurysm. No retroperitoneal or mesenteric adenopathy. Reproductive: The patient is status post hysterectomy. No pelvic mass is noted. Other: No ascites or free abdominal air. The urinary bladder is unremarkable. Small nonspecific bilateral inguinal lymph nodes are noted. Musculoskeletal: No destructive bony lesions are noted. Mild degenerative changes bilateral SI joints. Sagittal images of the spine shows mild degenerative changes thoracolumbar spine. IMPRESSION: 1. There is mild gaseous distension of the stomach with fluid and gas. No evidence of gastric outlet obstruction. Diffuse mild distension of small bowel with fluid and some air-fluid levels. There is no transition point in caliber of small bowel. Findings highly suspicious for diffuse enteritis or ileus. Less likely early bowel obstruction. Clinical correlation is necessary. Some liquid stool and gas noted within cecum. Normal appendix. No pericecal inflammation. 2. No colonic obstruction. No evidence of colitis or diverticulitis. 3. No hydronephrosis or hydroureter. 4. Status post hysterectomy. 5. Mild degenerative changes thoracolumbar spine. Electronically Signed   By: Lahoma Crocker M.D.   On: 08/16/2015 11:11   Dg Abd Portable 1v  08/18/2015  CLINICAL DATA:  Small bowel obstruction. EXAM: PORTABLE ABDOMEN - 1 VIEW COMPARISON:  Radiograph August 17, 2015. FINDINGS: The bowel gas pattern is normal. Status post cholecystectomy. Phleboliths are in the pelvis. IMPRESSION: No evidence of bowel obstruction or ileus. Electronically Signed   By: Marijo Conception, M.D.   On: 08/18/2015 09:31   Dg Abd Portable 1v  08/16/2015  CLINICAL DATA:  Nasogastric tube placement at bedside. EXAM: PORTABLE ABDOMEN - 1 VIEW 9:33 p.m.: COMPARISON:  CT abdomen and pelvis performed earlier today at 10:51 a.m. FINDINGS: Nasogastric tube looped in the stomach with tip in the fundus. Bowel gas pattern unremarkable without evidence of obstruction or significant ileus. Gas within  upper normal caliber sigmoid colon. Moderate stool burden in the colon. Contrast material within the urinary  bladder from the CT earlier today. Surgical clips in the right upper quadrant prior cholecystectomy. IMPRESSION: 1. Nasogastric tube looped in the stomach with its tip in the fundus. 2. No acute abdominal abnormality.  Moderate colonic stool burden. Electronically Signed   By: Evangeline Dakin M.D.   On: 08/16/2015 21:52    Time Spent in minutes  25   Louellen Molder M.D on 08/18/2015 at 11:59 AM  Between 7am to 7pm - Pager - [phone]  After 7pm go to [email or web address] - password [email or web address]  [facility] -  Office  [phone]

## 2015-08-18 NOTE — Progress Notes (Signed)
Patient states she had no stool after suppository but did pass a lot of gas and abdomen feels much better.

## 2015-08-18 NOTE — Progress Notes (Signed)
Daily Rounding Note  08/18/2015, 10:36 AM  LOS: 1 day   SUBJECTIVE:   Chief complaint: n/v/abdominal pain     Note foley placed due to urinary retention of 450cc.   Used 12 mg Morphine for pain yesterday, last dose at 2120.  No morphine yet today.  Last Zofran was at 1620 yesterday.   Small BM yesterday, some flatus later in day.  3 episodes emesis throughout day/evening after NGT removed.  + intense bloating yesterday but improved today.  Overall feels better.  OBJECTIVE:         Vital signs in last 24 hours:    Temp:  [98.4 F (36.9 C)-98.9 F (37.2 C)] 98.6 F (37 C) (07/15 0513) Pulse Rate:  [88-96] 88 (07/15 0513) Resp:  [15-16] 15 (07/15 0513) BP: (120-122)/(72-77) 120/72 mmHg (07/15 0513) SpO2:  [94 %-95 %] 95 % (07/15 0513) Last BM Date: 08/17/15 Filed Weights   08/16/15 1948  Weight: 101.606 kg (224 lb)   General: pleasant, seems depressed.    Heart: RRR Chest: clear bil Abdomen: soft, NT, active BS.    Extremities: no CCE Neuro/Psych:  Alert, oriented x 3.  No gross deficits.  Affect improved but still bland.   Intake/Output from previous day: 07/14 0701 - 07/15 0700 In: 4325.7 [I.V.:4185.8; IV Piggyback:139.9] Out: 3150 [Urine:2850; Emesis/NG output:300]  Intake/Output this shift:    Lab Results:  Recent Labs  08/16/15 1639 08/17/15 0352  WBC 8.4 7.3  HGB 12.8 12.3  HCT 39.3 38.0  PLT 241 252   BMET  Recent Labs  08/16/15 1639 08/17/15 0352 08/18/15 0354  NA 139 138 139  K 3.5 3.8 4.2  CL 105 108 108  CO2 25 24 22   GLUCOSE 78 96 83  BUN 8 9 10   CREATININE 0.63 0.65 0.57  CALCIUM 8.6* 8.2* 8.5*   LFT  Recent Labs  08/16/15 1639 08/17/15 0352  PROT 7.6 7.0  ALBUMIN 4.1 3.7  AST 28 24  ALT 19 17  ALKPHOS 126 127*  BILITOT 0.7 1.0   PT/INR No results for input(s): LABPROT, INR in the last 72 hours. Hepatitis Panel No results for input(s): HEPBSAG, HCVAB, HEPAIGM,  HEPBIGM in the last 72 hours.  Studies/Results: Dg Abd 1 View  08/17/2015  CLINICAL DATA:  Nausea and vomiting EXAM: ABDOMEN - 1 VIEW COMPARISON:  08/16/2015 FINDINGS: Previously seen nasogastric catheter is not well appreciated on this exam. Scattered large and small bowel gas is noted. No free air is seen. No obstructive changes are noted. Fecal material is noted throughout the colon stable from the prior exam. IMPRESSION: No acute abnormality noted. Electronically Signed   By: Inez Catalina M.D.   On: 08/17/2015 09:39   Ct Abdomen Pelvis W Contrast  08/16/2015  CLINICAL DATA:  Right upper quadrant pain, bloating, nausea and vomiting for 6 weeks EXAM: CT ABDOMEN AND PELVIS WITH CONTRAST TECHNIQUE: Multidetector CT imaging of the abdomen and pelvis was performed using the standard protocol following bolus administration of intravenous contrast. CONTRAST:  175mL ISOVUE-300 IOPAMIDOL (ISOVUE-300) INJECTION 61% COMPARISON:  01/17/2014 FINDINGS: Lower chest:  The lung bases are unremarkable. Hepatobiliary: The patient is status postcholecystectomy. No focal hepatic mass. No intrahepatic biliary ductal dilatation. Pancreas: Enhanced pancreas is unremarkable. Spleen: Enhanced spleen is unremarkable. Adrenals/Urinary Tract: No adrenal gland mass. Kidneys are symmetrical in enhancement. No hydronephrosis or hydroureter. Delayed renal images shows bilateral renal symmetrical excretion. Bilateral visualized proximal ureter is unremarkable. Stomach/Bowel: Mild  gaseous distension of the stomach with fluid and some gas without evidence of gastric outlet obstruction. Mild diffuse small bowel distension with fluid and some air-fluid levels. Findings highly suspicious for diffuse enteritis or ileus. Less likely partial small bowel obstruction. No transition point in caliber of small bowel. Some liquid stool and gas noted within cecum. Normal appendix is noted in axial image 51. Some colonic stool noted within transverse  colon and descending colon. Moderate gas noted in proximal sigmoid colon. Mid sigmoid colon is collapsed small caliber. Some colonic stool noted within rectum. No distal colonic obstruction. Vascular/Lymphatic: No aortic aneurysm. No retroperitoneal or mesenteric adenopathy. Reproductive: The patient is status post hysterectomy. No pelvic mass is noted. Other: No ascites or free abdominal air. The urinary bladder is unremarkable. Small nonspecific bilateral inguinal lymph nodes are noted. Musculoskeletal: No destructive bony lesions are noted. Mild degenerative changes bilateral SI joints. Sagittal images of the spine shows mild degenerative changes thoracolumbar spine. IMPRESSION: 1. There is mild gaseous distension of the stomach with fluid and gas. No evidence of gastric outlet obstruction. Diffuse mild distension of small bowel with fluid and some air-fluid levels. There is no transition point in caliber of small bowel. Findings highly suspicious for diffuse enteritis or ileus. Less likely early bowel obstruction. Clinical correlation is necessary. Some liquid stool and gas noted within cecum. Normal appendix. No pericecal inflammation. 2. No colonic obstruction. No evidence of colitis or diverticulitis. 3. No hydronephrosis or hydroureter. 4. Status post hysterectomy. 5. Mild degenerative changes thoracolumbar spine. Electronically Signed   By: Lahoma Crocker M.D.   On: 08/16/2015 11:11   Dg Abd Portable 1v  08/18/2015  CLINICAL DATA:  Small bowel obstruction. EXAM: PORTABLE ABDOMEN - 1 VIEW COMPARISON:  Radiograph August 17, 2015. FINDINGS: The bowel gas pattern is normal. Status post cholecystectomy. Phleboliths are in the pelvis. IMPRESSION: No evidence of bowel obstruction or ileus. Electronically Signed   By: Marijo Conception, M.D.   On: 08/18/2015 09:31   Dg Abd Portable 1v  08/16/2015  CLINICAL DATA:  Nasogastric tube placement at bedside. EXAM: PORTABLE ABDOMEN - 1 VIEW 9:33 p.m.: COMPARISON:  CT abdomen  and pelvis performed earlier today at 10:51 a.m. FINDINGS: Nasogastric tube looped in the stomach with tip in the fundus. Bowel gas pattern unremarkable without evidence of obstruction or significant ileus. Gas within upper normal caliber sigmoid colon. Moderate stool burden in the colon. Contrast material within the urinary bladder from the CT earlier today. Surgical clips in the right upper quadrant prior cholecystectomy. IMPRESSION: 1. Nasogastric tube looped in the stomach with its tip in the fundus. 2. No acute abdominal abnormality.  Moderate colonic stool burden. Electronically Signed   By: Evangeline Dakin M.D.   On: 08/16/2015 21:52    ASSESMENT:   *  Ileus/enteritis, ? PSBO.   The BGP had normalized at follow up film of 7/14.  Started on Reglan yesterday. Dulcolax PR at 1045 yesterday.   *  Urinary retention, she says it was present even before her first dose of Morphine.    *  Depression.    PLAN   *  Continue Reglan which will time out at 0600 tomorrow.  Clear liquids.  Give Dulcolax PR another 2 x today.  Limit narcotics.  Foley needs to come out and she needs to be urinating on her own before discharge.    Ambulate in hall way.   I restarted her Seroquel.  Can probably change to po synthroid, but will  defer this to hospitalist.     Azucena Freed  08/18/2015, 10:36 AM Pager: [phone]  I have discussed the case with the PA, and that is the plan I formulated. I personally interviewed and examined the patient.I personally reviewed today's abdominal imaging.  CC: nausea and vomiting  She is improved.  Still with nausea, no vomiting today.  Having BMs after ducolax She would like to try something to eat.  KUB shows no ileus or obstruction anymore. Blood work normal.  I think she either had a PSBO or a viral gastroenteritis without diarrhea. Either way, she is improved.  I think there may also be a functional overlay.  Diet ordered.  Continue reglan until tomorrow and  as-needed zofran.  I think she can go home tomorrow if keeps food down.   Nelida Meuse III Pager [phone]  Mon-Fri 8a-5p [phone] after 5p, weekends, holidays

## 2015-08-19 ENCOUNTER — Encounter (HOSPITAL_COMMUNITY): Payer: Self-pay | Admitting: Internal Medicine

## 2015-08-19 NOTE — Discharge Summary (Signed)
Physician Discharge Summary  Cynthia [name] [id number] DOB: 1965/10/12 DOA: 08/16/2015  PCP: Henrine Screws, MD  Admit date: 08/16/2015 Discharge date: 08/19/2015  Admitted From: Home Disposition:  Home  Recommendations for Outpatient Follow-up:  1. Follow up with PCP in 1-2 weeks   Home Health: None Equipment/Devices: None  Discharge Condition: Fair CODE STATUS: Full code Diet recommendation: Heart Healthy      Discharge Diagnoses:  Principal Problem:   Partial small bowel obstruction (HCC)  Active Problems:   Diastolic congestive heart failure (HCC)   Hyperlipemia   Hypothyroidism   History of depression   Anxiety   Generalized abdominal pain   Intractable cyclical vomiting with nausea  Brief narrative/ HPI 50 year old female with history of hypothyroidism, depression, migraines, anxiety, GERD, diverticulosis with history of colon polyps presented to the ED with intermittent abdominal pain for past 6 weeks worsened on the day of admission associated with bloating sensation and multiple episodes of vomiting. CT abdomen suggestive of enteritis however clinically appeared to have partial small bowel obstruction and admitted for conservative management. [facility] consulted.  Hospital course:  Principal Problem: Partial SBO (small bowel obstruction) ([place]) Suspect associated  viral gastroenteritis. Managed conservatively leg with pain control, antiemetics and IV fluids. Required NG for 1 day. Symptoms improved. Follow-up abdominal x-ray showed no small bowel obstruction. Diet advanced and tolerating well. No further abdominal symptoms, nausea or vomiting. Appreciate GI evaluation and recommendations. Patient stable to be discharged home with PCP follow-up.  Active Problems:  Acute urinary retention CT abdomen on admission without any obstructive findings. Required Foley placement. Remove this morning and voiding without difficulty.   Diastolic congestive  heart failure (HCC) Euvolemic.    Hypothyroidism Continue Synthroid.   History of anxiety and depression Resume home medications.       Family Communication : Family at bedside    Consults : [facility]  Procedures : CT abdomen and pelvis  Discharge Instructions     Medication List    STOP taking these medications        colchicine 0.6 MG tablet      TAKE these medications        ALPRAZolam 1 MG tablet  Commonly known as:  XANAX  Take 0.5 mg by mouth at bedtime as needed for anxiety.     atorvastatin 40 MG tablet  Commonly known as:  LIPITOR  Take 40 mg by mouth daily at 6 PM. Reported on 08/16/2015     ESTRATEST PO  Take 1 tablet by mouth daily.     lamoTRIgine 200 MG tablet  Commonly known as:  LAMICTAL  Take 200 mg by mouth at bedtime.     levothyroxine 75 MCG tablet  Commonly known as:  SYNTHROID, LEVOTHROID  Take 75 mcg by mouth daily.     metoprolol tartrate 25 MG tablet  Commonly known as:  LOPRESSOR  TAKE 1 TABLET BY MOUTH 2 TIMES DAILY     ondansetron 8 MG disintegrating tablet  Commonly known as:  ZOFRAN ODT  Take 1 tablet (8 mg total) by mouth every 8 (eight) hours as needed for nausea.     QUEtiapine 300 MG 24 hr tablet  Commonly known as:  SEROQUEL XR  Take 300 mg by mouth at bedtime.     rizatriptan 10 MG tablet  Commonly known as:  MAXALT  Take 10 mg by mouth as needed. May repeat in 2 hours if needed     zolpidem 10 MG tablet  Commonly known  as:  AMBIEN  Take 10 mg by mouth at bedtime as needed for sleep.           Follow-up Information    Follow up with GATES,ROBERT NEVILL, MD. Schedule an appointment as soon as possible for a visit in 1 week.   Specialty:  Internal Medicine   Contact information:   [address] [place] [address] [phone]      Allergies  Allergen Reactions  . Ampicillin     SERUM SICKNESS REACTION  . Imitrex [Sumatriptan] Other (See Comments)    Chest Pain  .  Prednisone Other (See Comments)    Eye Irritation.  Marland Kitchen Zoloft [Sertraline Hcl] Other (See Comments)    hyperactive    Consultations:  [facility]   Procedures/Studies: Dg Abd 1 View  08/17/2015  CLINICAL DATA:  Nausea and vomiting EXAM: ABDOMEN - 1 VIEW COMPARISON:  08/16/2015 FINDINGS: Previously seen nasogastric catheter is not well appreciated on this exam. Scattered large and small bowel gas is noted. No free air is seen. No obstructive changes are noted. Fecal material is noted throughout the colon stable from the prior exam. IMPRESSION: No acute abnormality noted. Electronically Signed   By: Inez Catalina M.D.   On: 08/17/2015 09:39   Ct Abdomen Pelvis W Contrast  08/16/2015  CLINICAL DATA:  Right upper quadrant pain, bloating, nausea and vomiting for 6 weeks EXAM: CT ABDOMEN AND PELVIS WITH CONTRAST TECHNIQUE: Multidetector CT imaging of the abdomen and pelvis was performed using the standard protocol following bolus administration of intravenous contrast. CONTRAST:  135mL ISOVUE-300 IOPAMIDOL (ISOVUE-300) INJECTION 61% COMPARISON:  01/17/2014 FINDINGS: Lower chest:  The lung bases are unremarkable. Hepatobiliary: The patient is status postcholecystectomy. No focal hepatic mass. No intrahepatic biliary ductal dilatation. Pancreas: Enhanced pancreas is unremarkable. Spleen: Enhanced spleen is unremarkable. Adrenals/Urinary Tract: No adrenal gland mass. Kidneys are symmetrical in enhancement. No hydronephrosis or hydroureter. Delayed renal images shows bilateral renal symmetrical excretion. Bilateral visualized proximal ureter is unremarkable. Stomach/Bowel: Mild gaseous distension of the stomach with fluid and some gas without evidence of gastric outlet obstruction. Mild diffuse small bowel distension with fluid and some air-fluid levels. Findings highly suspicious for diffuse enteritis or ileus. Less likely partial small bowel obstruction. No transition point in caliber of small bowel. Some  liquid stool and gas noted within cecum. Normal appendix is noted in axial image 51. Some colonic stool noted within transverse colon and descending colon. Moderate gas noted in proximal sigmoid colon. Mid sigmoid colon is collapsed small caliber. Some colonic stool noted within rectum. No distal colonic obstruction. Vascular/Lymphatic: No aortic aneurysm. No retroperitoneal or mesenteric adenopathy. Reproductive: The patient is status post hysterectomy. No pelvic mass is noted. Other: No ascites or free abdominal air. The urinary bladder is unremarkable. Small nonspecific bilateral inguinal lymph nodes are noted. Musculoskeletal: No destructive bony lesions are noted. Mild degenerative changes bilateral SI joints. Sagittal images of the spine shows mild degenerative changes thoracolumbar spine. IMPRESSION: 1. There is mild gaseous distension of the stomach with fluid and gas. No evidence of gastric outlet obstruction. Diffuse mild distension of small bowel with fluid and some air-fluid levels. There is no transition point in caliber of small bowel. Findings highly suspicious for diffuse enteritis or ileus. Less likely early bowel obstruction. Clinical correlation is necessary. Some liquid stool and gas noted within cecum. Normal appendix. No pericecal inflammation. 2. No colonic obstruction. No evidence of colitis or diverticulitis. 3. No hydronephrosis or hydroureter. 4. Status  post hysterectomy. 5. Mild degenerative changes thoracolumbar spine. Electronically Signed   By: Lahoma Crocker M.D.   On: 08/16/2015 11:11   Dg Abd Portable 1v  08/18/2015  CLINICAL DATA:  Small bowel obstruction. EXAM: PORTABLE ABDOMEN - 1 VIEW COMPARISON:  Radiograph August 17, 2015. FINDINGS: The bowel gas pattern is normal. Status post cholecystectomy. Phleboliths are in the pelvis. IMPRESSION: No evidence of bowel obstruction or ileus. Electronically Signed   By: Marijo Conception, M.D.   On: 08/18/2015 09:31   Dg Abd Portable  1v  08/16/2015  CLINICAL DATA:  Nasogastric tube placement at bedside. EXAM: PORTABLE ABDOMEN - 1 VIEW 9:33 p.m.: COMPARISON:  CT abdomen and pelvis performed earlier today at 10:51 a.m. FINDINGS: Nasogastric tube looped in the stomach with tip in the fundus. Bowel gas pattern unremarkable without evidence of obstruction or significant ileus. Gas within upper normal caliber sigmoid colon. Moderate stool burden in the colon. Contrast material within the urinary bladder from the CT earlier today. Surgical clips in the right upper quadrant prior cholecystectomy. IMPRESSION: 1. Nasogastric tube looped in the stomach with its tip in the fundus. 2. No acute abdominal abnormality.  Moderate colonic stool burden. Electronically Signed   By: Evangeline Dakin M.D.   On: 08/16/2015 21:52       Subjective: No further abdominal pain symptoms, nausea or vomiting. Tolerating clears.  Discharge Exam: Filed Vitals:   08/18/15 2031 08/19/15 0604  BP: 121/75 120/77  Pulse: 67 72  Temp: 98.2 F (36.8 C) 99 F (37.2 C)  Resp: 14 14   Filed Vitals:   08/18/15 0513 08/18/15 1401 08/18/15 2031 08/19/15 0604  BP: 120/72 116/75 121/75 120/77  Pulse: 88 72 67 72  Temp: 98.6 F (37 C) 98.4 F (36.9 C) 98.2 F (36.8 C) 99 F (37.2 C)  TempSrc: Oral Oral Oral Oral  Resp: 15 16 14 14   Height:      Weight:      SpO2: 95% 97% 97% 95%     Gen: Not in distress HEENT: moist mucosa,  supple neck Chest: clear b/l, no added sounds CVS: N S1&S2, no murmurs, rubs or gallop GI: soft, nondistended, nontender, bowel sounds present, Musculoskeletal: warm, no edema  The results of significant diagnostics from this hospitalization (including imaging, microbiology, ancillary and laboratory) are listed below for reference.     Microbiology: No results found for this or any previous visit (from the past 240 hour(s)).   Labs: BNP (last 3 results) No results for input(s): BNP in the last 8760 hours. Basic Metabolic  Panel:  Recent Labs Lab 08/16/15 1639 08/17/15 0352 08/18/15 0354  NA 139 138 139  K 3.5 3.8 4.2  CL 105 108 108  CO2 25 24 22   GLUCOSE 78 96 83  BUN 8 9 10   CREATININE 0.63 0.65 0.57  CALCIUM 8.6* 8.2* 8.5*  MG 1.9  --   --   PHOS 2.9  --   --    Liver Function Tests:  Recent Labs Lab 08/16/15 1639 08/17/15 0352  AST 28 24  ALT 19 17  ALKPHOS 126 127*  BILITOT 0.7 1.0  PROT 7.6 7.0  ALBUMIN 4.1 3.7    Recent Labs Lab 08/16/15 1639  LIPASE 21   No results for input(s): AMMONIA in the last 168 hours. CBC:  Recent Labs Lab 08/16/15 1639 08/17/15 0352  WBC 8.4 7.3  NEUTROABS 5.9 5.3  HGB 12.8 12.3  HCT 39.3 38.0  MCV 94.0 93.6  PLT 241 252   Cardiac Enzymes: No results for input(s): CKTOTAL, CKMB, CKMBINDEX, TROPONINI in the last 168 hours. BNP: Invalid input(s): POCBNP CBG: No results for input(s): GLUCAP in the last 168 hours. D-Dimer No results for input(s): DDIMER in the last 72 hours. Hgb A1c No results for input(s): HGBA1C in the last 72 hours. Lipid Profile No results for input(s): CHOL, HDL, LDLCALC, TRIG, CHOLHDL, LDLDIRECT in the last 72 hours. Thyroid function studies No results for input(s): TSH, T4TOTAL, T3FREE, THYROIDAB in the last 72 hours.  Invalid input(s): FREET3 Anemia work up No results for input(s): VITAMINB12, FOLATE, FERRITIN, TIBC, IRON, RETICCTPCT in the last 72 hours. Urinalysis    Component Value Date/Time   COLORURINE YELLOW 08/16/2015 1814   APPEARANCEUR CLEAR 08/16/2015 1814   LABSPEC 1.024 08/16/2015 1814   PHURINE 6.5 08/16/2015 1814   GLUCOSEU NEGATIVE 08/16/2015 1814   HGBUR NEGATIVE 08/16/2015 1814   BILIRUBINUR NEGATIVE 08/16/2015 1814   KETONESUR NEGATIVE 08/16/2015 1814   PROTEINUR NEGATIVE 08/16/2015 1814   NITRITE NEGATIVE 08/16/2015 1814   LEUKOCYTESUR NEGATIVE 08/16/2015 1814   Sepsis Labs Invalid input(s): PROCALCITONIN,  WBC,  LACTICIDVEN Microbiology No results found for this or any  previous visit (from the past 240 hour(s)).   Time coordinating discharge: < 30 minutes  SIGNED:   Louellen Molder, MD  [facility] 08/19/2015, 10:57 AM Pager   If 7PM-7AM, please contact night-coverage [email or web address] Password [email or web address]

## 2015-08-19 NOTE — Progress Notes (Signed)
Nursing Discharge Summary  Patient ID: Cynthia [name] MRN: [id number] DOB/AGE: [birth year] 50 y.o.  Admit date: 08/16/2015 Discharge date: 08/19/2015  Discharged Condition: good  Disposition: 01-Home or Self Care  Follow-up Information    Follow up with GATES,ROBERT NEVILL, MD. Schedule an appointment as soon as possible for a visit in 1 week.   Specialty:  Internal Medicine   Contact information:   [address] [place] [address] [phone]       Prescriptions Given: Patient had no new prescriptions.  Patient discharge instructions reviewed with patient and husband at bedside.  Patient and husband both verbalized understanding without further questions.   Means of Discharge: Patient wishes to ambulate downstairs to be discharged home via private vehicle.   Signed: Buel Ream 08/19/2015, 2:22 PM

## 2015-08-22 ENCOUNTER — Encounter: Payer: Self-pay | Admitting: Physician Assistant

## 2015-08-22 ENCOUNTER — Ambulatory Visit (INDEPENDENT_AMBULATORY_CARE_PROVIDER_SITE_OTHER)
Admission: RE | Admit: 2015-08-22 | Discharge: 2015-08-22 | Disposition: A | Discharge status: Home / Self Care | Payer: 59 | Source: Ambulatory Visit | Attending: Physician Assistant | Admitting: Physician Assistant

## 2015-08-22 ENCOUNTER — Ambulatory Visit (INDEPENDENT_AMBULATORY_CARE_PROVIDER_SITE_OTHER): Payer: 59 | Admitting: Physician Assistant

## 2015-08-22 VITALS — BP 146/82 | HR 90 | Ht 67.25 in | Wt 224.0 lb

## 2015-08-22 DIAGNOSIS — R140 Abdominal distension (gaseous): Secondary | ICD-10-CM

## 2015-08-22 DIAGNOSIS — Z8719 Personal history of other diseases of the digestive system: Secondary | ICD-10-CM

## 2015-08-22 DIAGNOSIS — R1084 Generalized abdominal pain: Secondary | ICD-10-CM

## 2015-08-22 MED ORDER — IOPAMIDOL (ISOVUE-300) INJECTION 61%
100.0000 mL | Freq: Once | INTRAVENOUS | Status: AC | PRN
  Administered 2015-08-22: 100 mL via INTRAVENOUS

## 2015-08-22 MED ORDER — ONDANSETRON 8 MG PO TBDP: ORAL_TABLET | ORAL | Status: DC

## 2015-08-22 NOTE — Progress Notes (Signed)
Patient ID: HENRENE SUBER, female   DOB: [birth year], 50 y.o.   MRN: [id number]   Subjective:    Patient ID: Abelina Bachelor, female    DOB: 09-Jan-1966, 50 y.o.   MRN: [id number]  HPI  Tyquasha is a pleasant 50 year old white female known to Dr. Henrene Pastor. She has history of diastolic congestive heart failure, obesity, hypertension, hypothyroidism, she had an episode of pericarditis in 2015. She is status post remote laparoscopic cholecystectomy in 2005 also had one other laparoscopy for GYN issues and is status post hysterectomy BSO in 2004. She has history of a tubovillous adenomatous colon polyp on last colonoscopy done in December 2013 just had 2 diminutive polyps one of which was a sessile serrated adenoma. She is to follow-up in 5 years. Patient had a hospital admission last week 713 through 08/19/2015 with acute abdominal pain nausea and vomiting. CT of the abdomen and pelvis showed mild gaseous distention of the stomach and mild diffuse small bowel distention but no definite transition point. Could not rule out enteritis versus ileus or early partial small bowel obstruction. She was seen in consultation by Dr. Loletha Carrow for GI. She was managed conservatively but did require NG decompression. Her symptoms improved and her last KUB prior discharge did not show any evidence of obstruction or ileus.  She says that she had been having intermittent symptoms for about 6 weeks prior to that admission with episodes of right-sided abdominal pain bloating and at times with these episodes would get very foul feculent type belching. She had not vomited until the day of admission when she woke up at 3 AM with this same pain distention nausea and vomiting.  Since discharge from the hospital he had recurrence of symptoms 2 days ago and then again last night with more right mid abdominal pain. She has been nauseated off and on and unable to eat and she tried to eat a sandwich last night and then had abrupt increase in her pain. She  feels very uncomfortable today was unable to go to work feels tight bloated and "like I'm obstructed". She did have some bowel movement last evening and has not yet vomited.  Review of Systems Pertinent positive and negative review of systems were noted in the above HPI section.  All other review of systems was otherwise negative.  Outpatient Encounter Prescriptions as of 08/22/2015  Medication Sig  . ALPRAZolam (XANAX) 1 MG tablet Take 0.5 mg by mouth at bedtime as needed for anxiety.  Marland Kitchen atorvastatin (LIPITOR) 40 MG tablet Take 40 mg by mouth daily at 6 PM. Reported on 08/16/2015  . Est Estrogens-Methyltest (ESTRATEST PO) Take 1 tablet by mouth daily.   Marland Kitchen lamoTRIgine (LAMICTAL) 200 MG tablet Take 200 mg by mouth at bedtime.   Marland Kitchen levothyroxine (SYNTHROID, LEVOTHROID) 75 MCG tablet Take 75 mcg by mouth daily.    . metoprolol tartrate (LOPRESSOR) 25 MG tablet TAKE 1 TABLET BY MOUTH 2 TIMES DAILY  . ondansetron (ZOFRAN ODT) 8 MG disintegrating tablet Take 1 tab every 6 hours as needed for nausea.  Marland Kitchen QUEtiapine (SEROQUEL XR) 300 MG 24 hr tablet Take 300 mg by mouth at bedtime.  . rizatriptan (MAXALT) 10 MG tablet Take 10 mg by mouth as needed. May repeat in 2 hours if needed  . zolpidem (AMBIEN) 10 MG tablet Take 10 mg by mouth at bedtime as needed for sleep.   . [DISCONTINUED] ondansetron (ZOFRAN ODT) 8 MG disintegrating tablet Take 1 tablet (8 mg total) by mouth every  8 (eight) hours as needed for nausea.   No facility-administered encounter medications on file as of 08/22/2015.   Allergies  Allergen Reactions  . Ampicillin     SERUM SICKNESS REACTION  . Imitrex [Sumatriptan] Other (See Comments)    Chest Pain  . Prednisone Other (See Comments)    Eye Irritation.  Marland Kitchen Zoloft [Sertraline Hcl] Other (See Comments)    hyperactive   Patient Active Problem List   Diagnosis Date Noted  . Partial small bowel obstruction ([place])   . Abdominal pain 08/16/2015  . Generalized abdominal pain   .  Intractable cyclical vomiting with nausea   . Pericarditis   . Pericarditis, acute 05/03/2010  . Obesity 05/03/2010  . Diastolic congestive heart failure ([place])   . Hyperlipemia   . Hypothyroidism   . History of depression   . History of migraines   . Rosacea   . Anxiety    Social History   Social History  . Marital Status: Married    Spouse Name: N/A  . Number of Children: 1 A  . Years of Education: MA   Occupational History  . physicians assistant [employer]   Social History Main Topics  . Smoking status: Never Smoker   . Smokeless tobacco: Never Used  . Alcohol Use: No  . Drug Use: No  . Sexual Activity: Not on file   Other Topics Concern  . Not on file   Social History Narrative   Patient lives at home with her family.   Caffeine Use: quit in 02/2011.    Ms. Fain family history is not on file. She was adopted.      Objective:    Filed Vitals:   08/22/15 1002  BP: 146/82  Pulse: 90    Physical Exam  well-developed white female in no acute distress, blood pressure 146/82 pulse 90, height 5 foot 7 weight 224. HEENT;nontraumatic normocephalic EOMI PERRLA sclera anicteric, Cardiovascular regular rate and rhythm with S1-S2 no murmur or gallop, Pulmonary; clear bilaterally, Abdomen; soft, she has some mild diffuse tenderness and is more focally tender in the right mid quadrant bowel sounds are positive with occasional rush no palpable mass or hepatosplenomegaly,  incisional scars, Rectal ;exam not done, Extremities ;no clubbing cyanosis or edema skin warm and dry, Neuropsych ;mood and affect appropriate     Assessment & Plan:   #35 50 year old white female status post hospitalization a week ago for probable partial small bowel obstruction with recurrent symptoms symptoms over the past 48 hours with sharp right-sided abdominal pain bloating nausea. Workup during that admission with CT scan with no transition point found improved with NG decompression. 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  Amy Genia Harold PA-C 08/22/2015   Cc: Josetta Huddle, MD

## 2015-08-22 NOTE — Progress Notes (Signed)
Agree with initial assessment and plans. May need to obtain surgical opinion if mechanical obstruction again suspected

## 2015-08-22 NOTE — Patient Instructions (Addendum)
We sent a refill for the Zofran ODT tablets to [facility] [address]  For now do a clear to full liquid diet.   You have been scheduled for a CT scan of the abdomen and pelvis at [facility] ([address]---this is in the same building as Press photographer).   You are scheduled today 7-19 . You should arrive 12:45 PM to your appointment time for registration. Please follow the written instructions below on the day of your exam:  WARNING: IF YOU ARE ALLERGIC TO IODINE/X-RAY DYE, PLEASE NOTIFY RADIOLOGY IMMEDIATELY AT [phone]! YOU WILL BE GIVEN A 13 HOUR PREMEDICATION PREP.  1) Do not eat solid food until after the test.      You may take any medications as prescribed with a small amount of water except for the following: Metformin, Glucophage, Glucovance, Avandamet, Riomet, Fortamet, Actoplus Met, Janumet, Glumetza or Metaglip. The above medications must be held the day of the exam AND 48 hours after the exam.  The purpose of you drinking the oral contrast is to aid in the visualization of your intestinal tract. The contrast solution may cause some diarrhea. Before your exam is started, you will be given a small amount of fluid to drink. Depending on your individual set of symptoms, you may also receive an intravenous injection of x-ray contrast/dye. Plan on being at [facility] for 30 minutes or long, depending on the type of exam you are having performed.  If you have any questions regarding your exam or if you need to reschedule, you may call the CT department at [phone] between the hours of 8:00 am and 5:00 pm, Monday-Friday.

## 2015-08-23 ENCOUNTER — Other Ambulatory Visit: Payer: Self-pay | Admitting: *Deleted

## 2015-08-23 ENCOUNTER — Telehealth: Payer: Self-pay | Admitting: Physician Assistant

## 2015-08-23 MED ORDER — PANTOPRAZOLE SODIUM 20 MG PO TBEC: 20.0000 mg | DELAYED_RELEASE_TABLET | Freq: Every day | ORAL | Status: DC

## 2015-08-23 MED ORDER — GLYCOPYRROLATE 2 MG PO TABS: 2.0000 mg | ORAL_TABLET | Freq: Two times a day (BID) | ORAL | Status: DC

## 2015-08-23 NOTE — Telephone Encounter (Signed)
See result note.  

## 2015-08-24 ENCOUNTER — Telehealth: Payer: Self-pay | Admitting: Physician Assistant

## 2015-08-24 NOTE — Telephone Encounter (Signed)
I have reviewed the chart and the reported sxs I do not have any other treatment or evaluation suggestions either at this time though have some ? If she is actually having some back problems If she is not getting relief she will need to seek evaluation at an urgent care vs ED

## 2015-08-24 NOTE — Telephone Encounter (Signed)
Patient given recommendations. 

## 2015-08-24 NOTE — Telephone Encounter (Signed)
Patient states she saw Nicoletta Ba, PA on Wednesday. CT did not show SBO. She is calling to report that the RLQ pain is worse. She is not having nausea or vomiting. She reports she saw he PCP today and he did labs and told her he did not know what to do for her. She states she is taking the Robinul but is did not help. She had some Norco and she took it last night. States she cannot bend and the pain radiates in to her back and leg. Amy is off Dr. Henrene Pastor is off.Marland Kitchen DOD- Carlean Purl Please, advise.

## 2015-08-27 ENCOUNTER — Telehealth: Payer: Self-pay | Admitting: Physician Assistant

## 2015-08-27 NOTE — Telephone Encounter (Signed)
The antispasmotic may be making her feel more tired .Marland Kitchen If she is not better by end of week then may need further testing -perhaps EGD ...have have  call back Friday or monday

## 2015-08-27 NOTE — Telephone Encounter (Signed)
She is minimally better, but she feels she is improving. No new symptoms. Not vomiting. Pain is in the same location. She feels very tired. No stamina. Taking the antispasmodic and PPI. Advancing her diet a little. She says she will do as you say, but she is not certain she can work a full day yet. Employer put her on FMLA for now.

## 2015-08-28 NOTE — Telephone Encounter (Signed)
Patient agrees to this plan. She will contact me on Friday with an update.

## 2015-08-29 ENCOUNTER — Telehealth: Payer: Self-pay | Admitting: Physician Assistant

## 2015-08-29 NOTE — Telephone Encounter (Signed)
A user error has taken place: Error °

## 2015-08-30 ENCOUNTER — Telehealth: Payer: Self-pay | Admitting: Physician Assistant

## 2015-08-31 ENCOUNTER — Telehealth: Payer: Self-pay | Admitting: Physician Assistant

## 2015-08-31 NOTE — Telephone Encounter (Signed)
She reports she has not had pain today but she also has not really eaten anything. Her employer seeking from her answers about when she will return to work. Less than 24 hours ago, the patient was having terrible abdominal pain and nausea. It resolved on it's own.  Discussed waiting until she has had several pain free and symptom free days that she was able to function normally includding eating. She will call back on Monday with an update.

## 2015-08-31 NOTE — Telephone Encounter (Signed)
Amy, do you want me to schedule an EGD for her with her primary GI?

## 2015-09-03 ENCOUNTER — Ambulatory Visit (INDEPENDENT_AMBULATORY_CARE_PROVIDER_SITE_OTHER)
Admission: RE | Admit: 2015-09-03 | Discharge: 2015-09-03 | Disposition: A | Discharge status: Home / Self Care | Payer: 59 | Source: Ambulatory Visit | Attending: Physician Assistant | Admitting: Physician Assistant

## 2015-09-03 ENCOUNTER — Other Ambulatory Visit: Payer: Self-pay

## 2015-09-03 DIAGNOSIS — R1084 Generalized abdominal pain: Secondary | ICD-10-CM

## 2015-09-03 DIAGNOSIS — R1013 Epigastric pain: Secondary | ICD-10-CM

## 2015-09-03 NOTE — Telephone Encounter (Signed)
Yes .. As soon as possible, and lets have her  Get 2 view abd films -r/o obstruction-today or tomorrow

## 2015-09-03 NOTE — Telephone Encounter (Signed)
She agrees to come in for the 2 view abd xray.  Dr Henrene Pastor is primary GI. It will be more than 2 weeks to get an EGD done.   I know of a doctor with some opening this week. Please advise.

## 2015-09-03 NOTE — Telephone Encounter (Signed)
Patient states that she is still nauseated and R side pain. Best # [phone]

## 2015-09-04 ENCOUNTER — Other Ambulatory Visit: Payer: Self-pay

## 2015-09-04 NOTE — Telephone Encounter (Signed)
If pt is ok with another MD doing EGD  That would be fine is another provider could do it  As pt is miserable. I saw DR Hassell Done at hospital today who says that he say pt yesterday and started an empiric course of cipro and flagyl

## 2015-09-04 NOTE — Telephone Encounter (Signed)
EGD scheduled for Friday with Dr Henrene Pastor. She will sign her consent tomorrow.

## 2015-09-06 ENCOUNTER — Telehealth: Payer: Self-pay | Admitting: Physician Assistant

## 2015-09-07 ENCOUNTER — Ambulatory Visit (AMBULATORY_SURGERY_CENTER): Payer: 59 | Admitting: Internal Medicine

## 2015-09-07 ENCOUNTER — Encounter: Payer: Self-pay | Admitting: Internal Medicine

## 2015-09-07 ENCOUNTER — Telehealth: Payer: Self-pay | Admitting: Physician Assistant

## 2015-09-07 VITALS — BP 113/76 | HR 77 | Temp 99.1°F | Resp 16 | Ht 67.25 in | Wt 224.0 lb

## 2015-09-07 DIAGNOSIS — Z8719 Personal history of other diseases of the digestive system: Secondary | ICD-10-CM | POA: Diagnosis not present

## 2015-09-07 DIAGNOSIS — R1084 Generalized abdominal pain: Secondary | ICD-10-CM

## 2015-09-07 MED ORDER — SODIUM CHLORIDE 0.9 % IV SOLN: 500.0000 mL | INTRAVENOUS | Status: DC

## 2015-09-07 NOTE — Op Note (Signed)
[facility] Patient Name: Cynthia [name] Procedure Date: 09/07/2015 2:20 PM MRN: [id number] Endoscopist: Docia Chuck. Henrene Pastor , MD Age: 50 Referring MD:  Date of Birth: [birth year] Gender: Female Account #: [id number] Procedure:                Upper GI endoscopy Indications:              Abdominal pain Medicines:                Monitored Anesthesia Care Procedure:                Pre-Anesthesia Assessment:                           - Prior to the procedure, a History and Physical                            was performed, and patient medications and                            allergies were reviewed. The patient's tolerance of                            previous anesthesia was also reviewed. The risks                            and benefits of the procedure and the sedation                            options and risks were discussed with the patient.                            All questions were answered, and informed consent                            was obtained. Prior Anticoagulants: The patient has                            taken no previous anticoagulant or antiplatelet                            agents. ASA Grade Assessment: II - A patient with                            mild systemic disease. After reviewing the risks                            and benefits, the patient was deemed in                            satisfactory condition to undergo the procedure.                           After obtaining informed consent, the endoscope was  passed under direct vision. Throughout the                            procedure, the patient's blood pressure, pulse, and                            oxygen saturations were monitored continuously. The                            Model GIF-[id number]) scope was introduced                            through the mouth, and advanced to the second part                            of duodenum. The upper GI endoscopy was                          accomplished without difficulty. The patient                            tolerated the procedure well. Scope In: Scope Out: Findings:                 The esophagus was normal.                           The stomach was normal.                           The examined duodenum was normal.                           The cardia and gastric fundus were normal on                            retroflexion. Complications:            No immediate complications. Estimated Blood Loss:     Estimated blood loss: none. Impression:               - Normal EGD.                           - Suspect problems with pain and recent partial                            obstruction secondary to adhesions. Now resolved. Recommendation:           - Please provide patient with note stating that she                            can return to work and resume her usual duties. She                            is medically cleared.                           -  Resume previous diet.                           - Continue present medications. GI follow-up as                            needed. Docia Chuck. Henrene Pastor, MD 09/07/2015 2:44:51 PM This report has been signed electronically.

## 2015-09-07 NOTE — Patient Instructions (Addendum)
YOU HAD AN ENDOSCOPIC PROCEDURE TODAY AT [facility]:   Refer to the procedure report that was given to you for any specific questions about what was found during the examination.  If the procedure report does not answer your questions, please call your gastroenterologist to clarify.  If you requested that your care partner not be given the details of your procedure findings, then the procedure report has been included in a sealed envelope for you to review at your convenience later.  YOU SHOULD EXPECT: Some feelings of bloating in the abdomen. Passage of more gas than usual.  Walking can help get rid of the air that was put into your GI tract during the procedure and reduce the bloating. If you had a lower endoscopy (such as a colonoscopy or flexible sigmoidoscopy) you may notice spotting of blood in your stool or on the toilet paper. If you underwent a bowel prep for your procedure, you may not have a normal bowel movement for a few days.  Please Note:  You might notice some irritation and congestion in your nose or some drainage.  This is from the oxygen used during your procedure.  There is no need for concern and it should clear up in a day or so.  SYMPTOMS TO REPORT IMMEDIATELY:     Following upper endoscopy (EGD)  Vomiting of blood or coffee ground material  New chest pain or pain under the shoulder blades  Painful or persistently difficult swallowing  New shortness of breath  Fever of 100F or higher  Black, tarry-looking stools  For urgent or emergent issues, a gastroenterologist can be reached at any hour by calling [phone].   DIET: Your first meal following the procedure should be a small meal and then it is ok to progress to your normal diet. Heavy or fried foods are harder to digest and may make you feel nauseous or bloated.  Likewise, meals heavy in dairy and vegetables can increase bloating.  Drink plenty of fluids but you should avoid alcoholic beverages  for 24 hours.  ACTIVITY:  You should plan to take it easy for the rest of today and you should NOT DRIVE or use heavy machinery until tomorrow (because of the sedation medicines used during the test).    FOLLOW UP: Our staff will call the number listed on your records the next business day following your procedure to check on you and address any questions or concerns that you may have regarding the information given to you following your procedure. If we do not reach you, we will leave a message.  However, if you are feeling well and you are not experiencing any problems, there is no need to return our call.  We will assume that you have returned to your regular daily activities without incident.  If any biopsies were taken you will be contacted by phone or by letter within the next 1-3 weeks.  Please call us at [phone] if you have not heard about the biopsies in 3 weeks.    SIGNATURES/CONFIDENTIALITY: You and/or your care partner have signed paperwork which will be entered into your electronic medical record.  These signatures attest to the fact that that the information above on your After Visit Summary has been reviewed and is understood.  Full responsibility of the confidentiality of this discharge information lies with you and/or your care-partner.   Work note for return to work provided as ordered by Dr. Henrene Pastor  Resume previous diet and  medications

## 2015-09-07 NOTE — Progress Notes (Signed)
To recovery, report to [place], RN, VSS 

## 2015-09-10 ENCOUNTER — Telehealth: Payer: Self-pay

## 2015-09-10 NOTE — Telephone Encounter (Signed)
Signed and sent back to [employer] people

## 2015-09-10 NOTE — Telephone Encounter (Signed)
  Follow up Call-  Call back number 09/07/2015  Post procedure Call Back phone  # [phone]  Permission to leave phone message Yes  Some recent data might be hidden     Patient questions:  Do you have a fever, pain , or abdominal swelling? No. Pain Score  0 *  Have you tolerated food without any problems? Yes.    Have you been able to return to your normal activities? Yes.    Do you have any questions about your discharge instructions: Diet   No. Medications  No. Follow up visit  No.  Do you have questions or concerns about your Care? No.  Actions: * If pain score is 4 or above: No action needed, pain <4.

## 2015-09-10 NOTE — Telephone Encounter (Signed)
I have put the paperwork on your desk

## 2015-09-11 ENCOUNTER — Telehealth: Payer: Self-pay | Admitting: Physician Assistant

## 2015-09-11 NOTE — Telephone Encounter (Signed)
I do not ever get FMLA paperwork. But Pamala Hurry may still have the papers or may be able to help get a copy. Please ask her. Thanks

## 2015-09-11 NOTE — Telephone Encounter (Signed)
I have notified patient that paperwork should be arriving at Medical records.

## 2015-09-17 ENCOUNTER — Ambulatory Visit: Payer: Self-pay | Admitting: Surgery

## 2015-09-18 ENCOUNTER — Telehealth: Payer: Self-pay | Admitting: Internal Medicine

## 2015-09-18 NOTE — Telephone Encounter (Signed)
Received referral for colonoscopy. Patient is not due for recall colon until 01/2017. Please advise as to scheduling. Thank you!

## 2015-09-19 ENCOUNTER — Telehealth: Payer: Self-pay

## 2015-09-19 NOTE — Telephone Encounter (Signed)
Pt scheduled for previsit 09/25/15@4 :30pm, colon scheduled in the [facility] 10/03/15@1 :30pm. Pt aware of appts.

## 2015-09-19 NOTE — Telephone Encounter (Signed)
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 However, she has been having problems with abdominal pain which may secondary to adhesions. I was contacted by her surgeon, Dr. Johnathan Hausen, who requested moving her colonoscopy up to this time to make sure the colon is clear in case he needs to operate on her adhesions. As such, please set her up for colonoscopy in the [facility]. Please let her know. Convert this to phone note for the future record. Thank you

## 2015-09-25 ENCOUNTER — Ambulatory Visit (AMBULATORY_SURGERY_CENTER): Payer: Self-pay | Admitting: *Deleted

## 2015-09-25 VITALS — Ht 68.0 in | Wt 224.0 lb

## 2015-09-25 DIAGNOSIS — Z86010 Personal history of colonic polyps: Secondary | ICD-10-CM

## 2015-09-25 MED ORDER — NA SULFATE-K SULFATE-MG SULF 17.5-3.13-1.6 GM/177ML PO SOLN: 1.0000 | Freq: Once | ORAL | 0 refills | Status: AC

## 2015-09-25 NOTE — Progress Notes (Signed)
No egg or soy allergy known to patient  No issues with past sedation with any surgeries  or procedures, no intubation problems - with kidney stones had N/V post op - did great with 8-4 egd propofol No diet pills per patient No home 02 use per patient  No blood thinners per patient  Pt denies issues with constipation  No A fib or A flutter - hx pericarditis

## 2015-09-26 ENCOUNTER — Encounter: Payer: Self-pay | Admitting: Internal Medicine

## 2015-10-03 ENCOUNTER — Ambulatory Visit (AMBULATORY_SURGERY_CENTER): Payer: 59 | Admitting: Internal Medicine

## 2015-10-03 ENCOUNTER — Encounter: Payer: Self-pay | Admitting: Internal Medicine

## 2015-10-03 VITALS — BP 111/69 | HR 80 | Temp 99.6°F | Resp 15 | Ht 68.0 in | Wt 224.0 lb

## 2015-10-03 DIAGNOSIS — Z86010 Personal history of colonic polyps: Secondary | ICD-10-CM | POA: Diagnosis not present

## 2015-10-03 DIAGNOSIS — D124 Benign neoplasm of descending colon: Secondary | ICD-10-CM

## 2015-10-03 DIAGNOSIS — D123 Benign neoplasm of transverse colon: Secondary | ICD-10-CM

## 2015-10-03 MED ORDER — SODIUM CHLORIDE 0.9 % IV SOLN: 500.0000 mL | INTRAVENOUS | Status: DC

## 2015-10-03 NOTE — Op Note (Signed)
[place] Patient Name: Cynthia [name] Procedure Date: 10/03/2015 1:17 PM MRN: [id number] Endoscopist: Docia Chuck. Henrene Pastor , MD Age: 50 Referring MD:  Date of Birth: Mar 10, 1965 Gender: Female Account #: [id number] Procedure:                Colonoscopy, a with cold snare polypectomy x-ray Indications:              High risk colon cancer surveillance: Personal                            history of adenoma with villous component. 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 The patient's tolerance of                            previous anesthesia was also reviewed. The risks                            and benefits of the procedure and the sedation                            options and risks were discussed with the patient.                            All questions were answered, and informed consent                            was obtained. Prior Anticoagulants: The patient has                            taken no previous anticoagulant or antiplatelet                            agents. ASA Grade Assessment: II - A patient with                            mild systemic disease. After reviewing the risks  and benefits, the patient was deemed in                            satisfactory condition to undergo the procedure.                           After obtaining informed consent, the colonoscope                            was passed under direct vision. Throughout the                            procedure, the patient's blood pressure, pulse, and                             oxygen saturations were monitored continuously. The                            Model CF-HQ190L [id number]) scope was introduced                            through the anus and advanced to the the cecum,                            identified by appendiceal orifice and ileocecal                            valve. The ileocecal valve, appendiceal orifice,                            and rectum were photographed. The quality of the                            bowel preparation was excellent. The colonoscopy                            was performed without difficulty. The patient                            tolerated the procedure well. The bowel preparation                            used was SUPREP. Scope In: 1:33:26 PM Scope Out: 1:53:34 PM Scope Withdrawal Time: 0 hours 15 minutes 31 seconds  Total Procedure Duration: 0 hours 20 minutes 8 seconds  Findings:                 Three polyps were found in the descending colon and                            transverse colon. The polyps were 2 to 4 mm in                            size. These polyps were removed with a cold  snare.                            Resection and retrieval were complete.                           Internal hemorrhoids were found during retroflexion.                           The exam was otherwise without abnormality on                            direct and retroflexion views. Complications:            No immediate complications. Estimated blood loss:                            None. Estimated Blood Loss:     Estimated blood loss: none. Impression:               - Three 2 to 4 mm polyps in the descending colon                            and in the transverse colon, removed with a cold                            snare. Resected and retrieved.                           - Internal hemorrhoids.                           - The examination was otherwise normal on direct                            and retroflexion  views. Recommendation:           - Repeat colonoscopy in 3 - 5 years for                            surveillance.                           - Patient has a contact number available for                            emergencies. The signs and symptoms of potential                            delayed complications were discussed with the                            patient. Return to normal activities tomorrow.                            Written discharge instructions were provided to the  patient.                           - Resume previous diet.                           - Continue present medications.                           - Await pathology results. Docia Chuck. Henrene Pastor, MD 10/03/2015 2:02:59 PM This report has been signed electronically. CC Letter to:             Johnathan Hausen MD

## 2015-10-03 NOTE — Progress Notes (Signed)
Called to room to assist during endoscopic procedure.  Patient ID and intended procedure confirmed with present staff. Received instructions for my participation in the procedure from the performing physician.  

## 2015-10-03 NOTE — Progress Notes (Signed)
Report to PACU, RN, vss, BBS= Clear.  

## 2015-10-03 NOTE — Patient Instructions (Signed)
YOU HAD AN ENDOSCOPIC PROCEDURE TODAY AT [facility]:   Refer to the procedure report that was given to you for any specific questions about what was found during the examination.  If the procedure report does not answer your questions, please call your gastroenterologist to clarify.  If you requested that your care partner not be given the details of your procedure findings, then the procedure report has been included in a sealed envelope for you to review at your convenience later.  YOU SHOULD EXPECT: Some feelings of bloating in the abdomen. Passage of more gas than usual.  Walking can help get rid of the air that was put into your GI tract during the procedure and reduce the bloating. If you had a lower endoscopy (such as a colonoscopy or flexible sigmoidoscopy) you may notice spotting of blood in your stool or on the toilet paper. If you underwent a bowel prep for your procedure, you may not have a normal bowel movement for a few days.  Please Note:  You might notice some irritation and congestion in your nose or some drainage.  This is from the oxygen used during your procedure.  There is no need for concern and it should clear up in a day or so.  SYMPTOMS TO REPORT IMMEDIATELY:   Following lower endoscopy (colonoscopy or flexible sigmoidoscopy):  Excessive amounts of blood in the stool  Significant tenderness or worsening of abdominal pains  Swelling of the abdomen that is new, acute  Fever of 100F or higher   For urgent or emergent issues, a gastroenterologist can be reached at any hour by calling [phone].   DIET:  We do recommend a small meal at first, but then you may proceed to your regular diet.  Drink plenty of fluids but you should avoid alcoholic beverages for 24 hours.  ACTIVITY:  You should plan to take it easy for the rest of today and you should NOT DRIVE or use heavy machinery until tomorrow (because of the sedation medicines used during the test).     FOLLOW UP: Our staff will call the number listed on your records the next business day following your procedure to check on you and address any questions or concerns that you may have regarding the information given to you following your procedure. If we do not reach you, we will leave a message.  However, if you are feeling well and you are not experiencing any problems, there is no need to return our call.  We will assume that you have returned to your regular daily activities without incident.  If any biopsies were taken you will be contacted by phone or by letter within the next 1-3 weeks.  Please call us at [phone] if you have not heard about the biopsies in 3 weeks.    SIGNATURES/CONFIDENTIALITY: You and/or your care partner have signed paperwork which will be entered into your electronic medical record.  These signatures attest to the fact that that the information above on your After Visit Summary has been reviewed and is understood.  Full responsibility of the confidentiality of this discharge information lies with you and/or your care-partner.  Polyps, hemorrhoids-handout given  Repeat colonoscopy will be determined by pathology.

## 2015-10-04 ENCOUNTER — Telehealth: Payer: Self-pay | Admitting: *Deleted

## 2015-10-04 ENCOUNTER — Encounter: Payer: Self-pay | Admitting: *Deleted

## 2015-10-04 NOTE — Telephone Encounter (Signed)
Error, already called

## 2015-10-04 NOTE — Telephone Encounter (Signed)
  Follow up Call-  Call back number 10/03/2015 09/07/2015  Post procedure Call Back phone  # [phone]  Permission to leave phone message Yes Yes  Some recent data might be hidden     Patient questions:  Do you have a fever, pain , or abdominal swelling? No. Pain Score  0 *  Have you tolerated food without any problems? Yes.    Have you been able to return to your normal activities? Yes.    Do you have any questions about your discharge instructions: Diet   No. Medications  No. Follow up visit  No.  Do you have questions or concerns about your Care? No.  Actions: * If pain score is 4 or above: No action needed, pain <4.

## 2015-10-10 ENCOUNTER — Encounter: Payer: Self-pay | Admitting: Internal Medicine

## 2015-10-22 NOTE — Patient Instructions (Signed)
Cynthia [name]  10/22/2015   Your procedure is scheduled on: 10/26/2015    Report to [facility] Main  Entrance take [place]  elevators to 3rd floor to  [place] at   [place] AM.  Call this number if you have problems the morning of surgery [phone]   Remember: ONLY 1 PERSON MAY GO WITH YOU TO SHORT STAY TO GET  READY MORNING OF [place].  Do not eat food or drink liquids :After Midnight.     Take these medicines the morning of surgery with A SIP OF WATER: Synthroid, metoprolol ( Lopressor0                                You may not have any metal on your body including hair pins and              piercings  Do not wear jewelry, make-up, lotions, powders or perfumes, deodorant             Do not wear nail polish.  Do not shave  48 hours prior to surgery.                 Do not bring valuables to the hospital. [place].  Contacts, dentures or bridgework may not be worn into surgery.  Leave suitcase in the car. After surgery it may be brought to your room.         Special Instructions: N/A              Please read over the following fact sheets you were given: _____________________________________________________________________             [facility] - Preparing for Surgery Before surgery, you can play an important role.  Because skin is not sterile, your skin needs to be as free of germs as possible.  You can reduce the number of germs on your skin by washing with CHG (chlorahexidine gluconate) soap before surgery.  CHG is an antiseptic cleaner which kills germs and bonds with the skin to continue killing germs even after washing. Please DO NOT use if you have an allergy to CHG or antibacterial soaps.  If your skin becomes reddened/irritated stop using the CHG and inform your nurse when you arrive at Short Stay. Do not shave (including legs and underarms) for at least 48 hours prior to the first  CHG shower.  You may shave your face/neck. Please follow these instructions carefully:  1.  Shower with CHG Soap the night before surgery and the  morning of Surgery.  2.  If you choose to wash your hair, wash your hair first as usual with your  normal  shampoo.  3.  After you shampoo, rinse your hair and body thoroughly to remove the  shampoo.                           4.  Use CHG as you would any other liquid soap.  You can apply chg directly  to the skin and wash                       Gently with a scrungie or  clean washcloth.  5.  Apply the CHG Soap to your body ONLY FROM THE NECK DOWN.   Do not use on face/ open                           Wound or open sores. Avoid contact with eyes, ears mouth and genitals (private parts).                       Wash face,  Genitals (private parts) with your normal soap.             6.  Wash thoroughly, paying special attention to the area where your surgery  will be performed.  7.  Thoroughly rinse your body with warm water from the neck down.  8.  DO NOT shower/wash with your normal soap after using and rinsing off  the CHG Soap.                9.  Pat yourself dry with a clean towel.            10.  Wear clean pajamas.            11.  Place clean sheets on your bed the night of your first shower and do not  sleep with pets. Day of Surgery : Do not apply any lotions/deodorants the morning of surgery.  Please wear clean clothes to the hospital/surgery center.  FAILURE TO FOLLOW THESE INSTRUCTIONS MAY RESULT IN THE CANCELLATION OF YOUR SURGERY PATIENT SIGNATURE_________________________________  NURSE SIGNATURE__________________________________  ________________________________________________________________________

## 2015-10-23 ENCOUNTER — Encounter (HOSPITAL_COMMUNITY): Payer: Self-pay

## 2015-10-23 ENCOUNTER — Encounter (HOSPITAL_COMMUNITY)
Admission: RE | Admit: 2015-10-23 | Discharge: 2015-10-23 | Disposition: A | Discharge status: Home / Self Care | Payer: 59 | Source: Ambulatory Visit | Attending: Surgery | Admitting: Surgery

## 2015-10-23 DIAGNOSIS — K219 Gastro-esophageal reflux disease without esophagitis: Secondary | ICD-10-CM | POA: Diagnosis not present

## 2015-10-23 DIAGNOSIS — F419 Anxiety disorder, unspecified: Secondary | ICD-10-CM | POA: Diagnosis not present

## 2015-10-23 DIAGNOSIS — K388 Other specified diseases of appendix: Secondary | ICD-10-CM | POA: Diagnosis not present

## 2015-10-23 DIAGNOSIS — E785 Hyperlipidemia, unspecified: Secondary | ICD-10-CM | POA: Diagnosis not present

## 2015-10-23 DIAGNOSIS — E039 Hypothyroidism, unspecified: Secondary | ICD-10-CM | POA: Diagnosis not present

## 2015-10-23 DIAGNOSIS — I5022 Chronic systolic (congestive) heart failure: Secondary | ICD-10-CM | POA: Diagnosis not present

## 2015-10-23 DIAGNOSIS — E669 Obesity, unspecified: Secondary | ICD-10-CM | POA: Diagnosis not present

## 2015-10-23 DIAGNOSIS — Z6835 Body mass index (BMI) 35.0-35.9, adult: Secondary | ICD-10-CM | POA: Diagnosis not present

## 2015-10-23 DIAGNOSIS — E875 Hyperkalemia: Secondary | ICD-10-CM | POA: Diagnosis not present

## 2015-10-23 LAB — CBC
HCT: 36.5 % (ref 36.0–46.0)
HEMOGLOBIN: 11.8 g/dL — AB (ref 12.0–15.0)
MCH: 30.4 pg (ref 26.0–34.0)
MCHC: 32.3 g/dL (ref 30.0–36.0)
MCV: 94.1 fL (ref 78.0–100.0)
PLATELETS: 243 10*3/uL (ref 150–400)
RBC: 3.88 MIL/uL (ref 3.87–5.11)
RDW: 13.4 % (ref 11.5–15.5)
WBC: 6.5 10*3/uL (ref 4.0–10.5)

## 2015-10-23 LAB — BASIC METABOLIC PANEL
ANION GAP: 8 (ref 5–15)
BUN: 9 mg/dL (ref 6–20)
CALCIUM: 9 mg/dL (ref 8.9–10.3)
CO2: 29 mmol/L (ref 22–32)
CREATININE: 0.79 mg/dL (ref 0.44–1.00)
Chloride: 102 mmol/L (ref 101–111)
Glucose, Bld: 137 mg/dL — ABNORMAL HIGH (ref 65–99)
Potassium: 3.5 mmol/L (ref 3.5–5.1)
SODIUM: 139 mmol/L (ref 135–145)

## 2015-10-23 NOTE — Progress Notes (Signed)
EKG-08/2015- EPIC

## 2015-10-26 ENCOUNTER — Observation Stay (HOSPITAL_COMMUNITY)
Admission: RE | Admit: 2015-10-26 | Discharge: 2015-10-31 | Disposition: A | Discharge status: Home / Self Care | Payer: 59 | Source: Ambulatory Visit | Attending: Surgery | Admitting: Surgery

## 2015-10-26 ENCOUNTER — Ambulatory Visit (HOSPITAL_COMMUNITY): Payer: 59 | Admitting: Certified Registered Nurse Anesthetist

## 2015-10-26 ENCOUNTER — Encounter (HOSPITAL_COMMUNITY): Payer: Self-pay

## 2015-10-26 ENCOUNTER — Encounter (HOSPITAL_COMMUNITY)
Admission: RE | Disposition: A | Discharge status: Home / Self Care | Payer: Self-pay | Source: Ambulatory Visit | Attending: Surgery

## 2015-10-26 DIAGNOSIS — F419 Anxiety disorder, unspecified: Secondary | ICD-10-CM | POA: Insufficient documentation

## 2015-10-26 DIAGNOSIS — E785 Hyperlipidemia, unspecified: Secondary | ICD-10-CM | POA: Insufficient documentation

## 2015-10-26 DIAGNOSIS — K219 Gastro-esophageal reflux disease without esophagitis: Secondary | ICD-10-CM | POA: Insufficient documentation

## 2015-10-26 DIAGNOSIS — I5022 Chronic systolic (congestive) heart failure: Secondary | ICD-10-CM | POA: Insufficient documentation

## 2015-10-26 DIAGNOSIS — Z6835 Body mass index (BMI) 35.0-35.9, adult: Secondary | ICD-10-CM | POA: Insufficient documentation

## 2015-10-26 DIAGNOSIS — R109 Unspecified abdominal pain: Secondary | ICD-10-CM | POA: Diagnosis present

## 2015-10-26 DIAGNOSIS — K388 Other specified diseases of appendix: Secondary | ICD-10-CM | POA: Diagnosis not present

## 2015-10-26 DIAGNOSIS — K562 Volvulus: Secondary | ICD-10-CM

## 2015-10-26 DIAGNOSIS — E875 Hyperkalemia: Secondary | ICD-10-CM | POA: Insufficient documentation

## 2015-10-26 DIAGNOSIS — E039 Hypothyroidism, unspecified: Secondary | ICD-10-CM | POA: Insufficient documentation

## 2015-10-26 DIAGNOSIS — E669 Obesity, unspecified: Secondary | ICD-10-CM | POA: Insufficient documentation

## 2015-10-26 HISTORY — DX: Adverse effect of unspecified anesthetic, initial encounter: T41.45XA

## 2015-10-26 HISTORY — DX: Other complications of anesthesia, initial encounter: T88.59XA

## 2015-10-26 HISTORY — PX: LAPAROSCOPY: SHX197

## 2015-10-26 SURGERY — LAPAROSCOPY, DIAGNOSTIC
Anesthesia: General | Site: Abdomen

## 2015-10-26 MED ORDER — METOCLOPRAMIDE HCL 5 MG/ML IJ SOLN: 5.0000 mg | Freq: Four times a day (QID) | INTRAMUSCULAR | Status: DC | PRN

## 2015-10-26 MED ORDER — FENTANYL CITRATE (PF) 100 MCG/2ML IJ SOLN
25.0000 ug | INTRAMUSCULAR | Status: DC | PRN
  Administered 2015-10-26 – 2015-10-28 (×7): 50 ug via INTRAVENOUS
  Filled 2015-10-26 (×7): qty 2

## 2015-10-26 MED ORDER — CEFAZOLIN SODIUM-DEXTROSE 2-4 GM/100ML-% IV SOLN
INTRAVENOUS | Status: AC
  Filled 2015-10-26: qty 100

## 2015-10-26 MED ORDER — LIDOCAINE 2% (20 MG/ML) 5 ML SYRINGE
INTRAMUSCULAR | Status: DC | PRN
  Administered 2015-10-26: 100 mg via INTRAVENOUS

## 2015-10-26 MED ORDER — ONDANSETRON HCL 4 MG/2ML IJ SOLN
4.0000 mg | Freq: Four times a day (QID) | INTRAMUSCULAR | Status: DC | PRN
  Administered 2015-10-26 – 2015-10-27 (×2): 4 mg via INTRAVENOUS
  Filled 2015-10-26 (×2): qty 2

## 2015-10-26 MED ORDER — PROPOFOL 10 MG/ML IV BOLUS
INTRAVENOUS | Status: AC
  Filled 2015-10-26: qty 20

## 2015-10-26 MED ORDER — MIDAZOLAM HCL 2 MG/2ML IJ SOLN
INTRAMUSCULAR | Status: AC
  Filled 2015-10-26: qty 2

## 2015-10-26 MED ORDER — LAMOTRIGINE 100 MG PO TABS
200.0000 mg | ORAL_TABLET | Freq: Every day | ORAL | Status: DC
  Administered 2015-10-26 – 2015-10-30 (×5): 200 mg via ORAL
  Filled 2015-10-26 (×6): qty 2

## 2015-10-26 MED ORDER — HYDROMORPHONE HCL 1 MG/ML IJ SOLN
INTRAMUSCULAR | Status: AC
  Filled 2015-10-26: qty 1

## 2015-10-26 MED ORDER — PROCHLORPERAZINE EDISYLATE 5 MG/ML IJ SOLN
10.0000 mg | Freq: Four times a day (QID) | INTRAMUSCULAR | Status: DC | PRN
  Administered 2015-10-26 – 2015-10-27 (×2): 10 mg via INTRAVENOUS
  Filled 2015-10-26 (×2): qty 2

## 2015-10-26 MED ORDER — ACETAMINOPHEN 10 MG/ML IV SOLN
INTRAVENOUS | Status: AC
  Filled 2015-10-26: qty 100

## 2015-10-26 MED ORDER — SUCCINYLCHOLINE CHLORIDE 200 MG/10ML IV SOSY
PREFILLED_SYRINGE | INTRAVENOUS | Status: DC | PRN
  Administered 2015-10-26: 100 mg via INTRAVENOUS

## 2015-10-26 MED ORDER — PHENYLEPHRINE 40 MCG/ML (10ML) SYRINGE FOR IV PUSH (FOR BLOOD PRESSURE SUPPORT)
PREFILLED_SYRINGE | INTRAVENOUS | Status: DC | PRN
  Administered 2015-10-26 (×6): 80 ug via INTRAVENOUS

## 2015-10-26 MED ORDER — ONDANSETRON HCL 4 MG/2ML IJ SOLN
INTRAMUSCULAR | Status: DC | PRN
  Administered 2015-10-26: 4 mg via INTRAVENOUS

## 2015-10-26 MED ORDER — LIDOCAINE 2% (20 MG/ML) 5 ML SYRINGE
INTRAMUSCULAR | Status: AC
  Filled 2015-10-26: qty 5

## 2015-10-26 MED ORDER — PROMETHAZINE HCL 25 MG/ML IJ SOLN
6.2500 mg | INTRAMUSCULAR | Status: DC | PRN
  Administered 2015-10-26: 6.25 mg via INTRAVENOUS

## 2015-10-26 MED ORDER — SUGAMMADEX SODIUM 200 MG/2ML IV SOLN
INTRAVENOUS | Status: AC
  Filled 2015-10-26: qty 2

## 2015-10-26 MED ORDER — METOPROLOL TARTRATE 25 MG PO TABS
25.0000 mg | ORAL_TABLET | Freq: Two times a day (BID) | ORAL | Status: DC
  Administered 2015-10-26 – 2015-10-31 (×10): 25 mg via ORAL
  Filled 2015-10-26 (×10): qty 1

## 2015-10-26 MED ORDER — LEVOTHYROXINE SODIUM 75 MCG PO TABS
75.0000 ug | ORAL_TABLET | Freq: Every day | ORAL | Status: DC
  Administered 2015-10-27 – 2015-10-31 (×5): 75 ug via ORAL
  Filled 2015-10-26 (×5): qty 1

## 2015-10-26 MED ORDER — FENTANYL CITRATE (PF) 100 MCG/2ML IJ SOLN
INTRAMUSCULAR | Status: DC | PRN
  Administered 2015-10-26 (×6): 50 ug via INTRAVENOUS

## 2015-10-26 MED ORDER — FENTANYL CITRATE (PF) 100 MCG/2ML IJ SOLN
INTRAMUSCULAR | Status: AC
  Filled 2015-10-26: qty 2

## 2015-10-26 MED ORDER — HEPARIN SODIUM (PORCINE) 5000 UNIT/ML IJ SOLN
5000.0000 [IU] | Freq: Three times a day (TID) | INTRAMUSCULAR | Status: DC
  Administered 2015-10-26 – 2015-10-31 (×15): 5000 [IU] via SUBCUTANEOUS
  Filled 2015-10-26 (×15): qty 1

## 2015-10-26 MED ORDER — HYDROMORPHONE HCL 1 MG/ML IJ SOLN: 0.2500 mg | INTRAMUSCULAR | Status: DC | PRN

## 2015-10-26 MED ORDER — HYDROMORPHONE HCL 1 MG/ML IJ SOLN
1.0000 mg | INTRAMUSCULAR | Status: DC | PRN
  Administered 2015-10-26 (×2): 1 mg via INTRAVENOUS
  Filled 2015-10-26 (×2): qty 1

## 2015-10-26 MED ORDER — KCL IN DEXTROSE-NACL 20-5-0.45 MEQ/L-%-% IV SOLN
INTRAVENOUS | Status: DC
  Administered 2015-10-26: 23:00:00 via INTRAVENOUS
  Administered 2015-10-26: 100 mL/h via INTRAVENOUS
  Filled 2015-10-26 (×3): qty 1000

## 2015-10-26 MED ORDER — PHENYLEPHRINE 40 MCG/ML (10ML) SYRINGE FOR IV PUSH (FOR BLOOD PRESSURE SUPPORT)
PREFILLED_SYRINGE | INTRAVENOUS | Status: AC
  Filled 2015-10-26: qty 10

## 2015-10-26 MED ORDER — ONDANSETRON 4 MG PO TBDP: 4.0000 mg | ORAL_TABLET | Freq: Four times a day (QID) | ORAL | Status: DC | PRN

## 2015-10-26 MED ORDER — ZOLPIDEM TARTRATE 5 MG PO TABS: 5.0000 mg | ORAL_TABLET | Freq: Every evening | ORAL | Status: DC | PRN

## 2015-10-26 MED ORDER — LACTATED RINGERS IV SOLN: INTRAVENOUS | Status: DC

## 2015-10-26 MED ORDER — BUPIVACAINE LIPOSOME 1.3 % IJ SUSP
20.0000 mL | Freq: Once | INTRAMUSCULAR | Status: AC
  Administered 2015-10-26: 20 mL
  Filled 2015-10-26: qty 20

## 2015-10-26 MED ORDER — ALPRAZOLAM 0.5 MG PO TABS: 0.5000 mg | ORAL_TABLET | Freq: Every evening | ORAL | Status: DC | PRN

## 2015-10-26 MED ORDER — PROMETHAZINE HCL 25 MG/ML IJ SOLN: 6.2500 mg | INTRAMUSCULAR | Status: DC | PRN

## 2015-10-26 MED ORDER — FENTANYL CITRATE (PF) 100 MCG/2ML IJ SOLN
INTRAMUSCULAR | Status: AC
  Filled 2015-10-26: qty 4

## 2015-10-26 MED ORDER — CHLORHEXIDINE GLUCONATE CLOTH 2 % EX PADS: 6.0000 | MEDICATED_PAD | Freq: Once | CUTANEOUS | Status: DC

## 2015-10-26 MED ORDER — QUETIAPINE FUMARATE ER 300 MG PO TB24
300.0000 mg | ORAL_TABLET | Freq: Every day | ORAL | Status: DC
  Administered 2015-10-26 – 2015-10-30 (×5): 300 mg via ORAL
  Filled 2015-10-26 (×5): qty 1

## 2015-10-26 MED ORDER — PHENYLEPHRINE 40 MCG/ML (10ML) SYRINGE FOR IV PUSH (FOR BLOOD PRESSURE SUPPORT)
PREFILLED_SYRINGE | INTRAVENOUS | Status: AC
  Filled 2015-10-26: qty 20

## 2015-10-26 MED ORDER — DEXAMETHASONE SODIUM PHOSPHATE 10 MG/ML IJ SOLN
INTRAMUSCULAR | Status: DC | PRN
  Administered 2015-10-26: 10 mg via INTRAVENOUS

## 2015-10-26 MED ORDER — HYDROMORPHONE HCL 1 MG/ML IJ SOLN
0.2500 mg | INTRAMUSCULAR | Status: DC | PRN
  Administered 2015-10-26 (×4): 0.5 mg via INTRAVENOUS

## 2015-10-26 MED ORDER — PROPOFOL 10 MG/ML IV BOLUS
INTRAVENOUS | Status: DC | PRN
  Administered 2015-10-26: 200 mg via INTRAVENOUS

## 2015-10-26 MED ORDER — ROCURONIUM BROMIDE 10 MG/ML (PF) SYRINGE
PREFILLED_SYRINGE | INTRAVENOUS | Status: DC | PRN
  Administered 2015-10-26 (×3): 10 mg via INTRAVENOUS
  Administered 2015-10-26: 40 mg via INTRAVENOUS

## 2015-10-26 MED ORDER — CEFAZOLIN SODIUM-DEXTROSE 2-4 GM/100ML-% IV SOLN
2.0000 g | INTRAVENOUS | Status: AC
  Administered 2015-10-26: 2 g via INTRAVENOUS

## 2015-10-26 MED ORDER — SODIUM CHLORIDE 0.9 % IJ SOLN
INTRAMUSCULAR | Status: AC
  Filled 2015-10-26: qty 50

## 2015-10-26 MED ORDER — HEPARIN SODIUM (PORCINE) 5000 UNIT/ML IJ SOLN
5000.0000 [IU] | Freq: Once | INTRAMUSCULAR | Status: AC
  Administered 2015-10-26: 5000 [IU] via SUBCUTANEOUS
  Filled 2015-10-26: qty 1

## 2015-10-26 MED ORDER — ONDANSETRON HCL 4 MG/2ML IJ SOLN
INTRAMUSCULAR | Status: AC
  Filled 2015-10-26: qty 2

## 2015-10-26 MED ORDER — SODIUM CHLORIDE 0.9 % IJ SOLN
INTRAMUSCULAR | Status: AC
  Filled 2015-10-26: qty 10

## 2015-10-26 MED ORDER — ACETAMINOPHEN 10 MG/ML IV SOLN
1000.0000 mg | Freq: Once | INTRAVENOUS | Status: AC
  Administered 2015-10-26: 1000 mg via INTRAVENOUS

## 2015-10-26 MED ORDER — LACTATED RINGERS IV SOLN
INTRAVENOUS | Status: DC | PRN
  Administered 2015-10-26 (×2): via INTRAVENOUS

## 2015-10-26 MED ORDER — SUGAMMADEX SODIUM 200 MG/2ML IV SOLN
INTRAVENOUS | Status: DC | PRN
  Administered 2015-10-26: 200 mg via INTRAVENOUS

## 2015-10-26 MED ORDER — 0.9 % SODIUM CHLORIDE (POUR BTL) OPTIME
TOPICAL | Status: DC | PRN
  Administered 2015-10-26: 2000 mL

## 2015-10-26 MED ORDER — HYDROMORPHONE HCL 1 MG/ML IJ SOLN
0.2500 mg | INTRAMUSCULAR | Status: DC | PRN
  Administered 2015-10-26 (×2): 0.5 mg via INTRAVENOUS

## 2015-10-26 MED ORDER — PROMETHAZINE HCL 25 MG/ML IJ SOLN
INTRAMUSCULAR | Status: AC
  Filled 2015-10-26: qty 1

## 2015-10-26 MED ORDER — MIDAZOLAM HCL 5 MG/5ML IJ SOLN
INTRAMUSCULAR | Status: DC | PRN
  Administered 2015-10-26: 2 mg via INTRAVENOUS

## 2015-10-26 MED ORDER — SUMATRIPTAN SUCCINATE 50 MG PO TABS
50.0000 mg | ORAL_TABLET | ORAL | Status: DC | PRN
  Filled 2015-10-26: qty 1

## 2015-10-26 MED ORDER — ROCURONIUM BROMIDE 10 MG/ML (PF) SYRINGE
PREFILLED_SYRINGE | INTRAVENOUS | Status: AC
  Filled 2015-10-26: qty 10

## 2015-10-26 MED ORDER — FAMOTIDINE IN NACL 20-0.9 MG/50ML-% IV SOLN
20.0000 mg | Freq: Two times a day (BID) | INTRAVENOUS | Status: DC
  Administered 2015-10-26 – 2015-10-27 (×3): 20 mg via INTRAVENOUS
  Filled 2015-10-26 (×3): qty 50

## 2015-10-26 SURGICAL SUPPLY — 48 items
APL SKNCLS STERI-STRIP NONHPOA (GAUZE/BANDAGES/DRESSINGS)
BENZOIN TINCTURE PRP APPL 2/3 (GAUZE/BANDAGES/DRESSINGS) IMPLANT
BINDER ABDOMINAL 12 ML 46-62 (SOFTGOODS) ×2 IMPLANT
CHLORAPREP W/TINT 26ML (MISCELLANEOUS) ×2 IMPLANT
CLOSURE WOUND 1/2 X4 (GAUZE/BANDAGES/DRESSINGS)
COVER SURGICAL LIGHT HANDLE (MISCELLANEOUS) ×2 IMPLANT
CUTTER FLEX LINEAR 45M (STAPLE) ×2 IMPLANT
DECANTER SPIKE VIAL GLASS SM (MISCELLANEOUS) IMPLANT
DRAPE UTILITY XL STRL (DRAPES) ×2 IMPLANT
DRSG OPSITE POSTOP 4X8 (GAUZE/BANDAGES/DRESSINGS) ×2 IMPLANT
ELECT L-HOOK LAP 45CM DISP (ELECTROSURGICAL) ×3
ELECT PENCIL ROCKER SW 15FT (MISCELLANEOUS) ×2 IMPLANT
ELECT REM PT RETURN 9FT ADLT (ELECTROSURGICAL) ×3
ELECTRODE L-HOOK LAP 45CM DISP (ELECTROSURGICAL) IMPLANT
ELECTRODE REM PT RTRN 9FT ADLT (ELECTROSURGICAL) ×1 IMPLANT
GLOVE BIOGEL M 8.0 STRL (GLOVE) ×3 IMPLANT
GLOVE BIOGEL PI IND STRL 7.0 (GLOVE) ×1 IMPLANT
GLOVE BIOGEL PI INDICATOR 7.0 (GLOVE) ×4
GOWN SPEC L4 XLG W/TWL (GOWN DISPOSABLE) ×1 IMPLANT
GOWN STRL REUS W/TWL LRG LVL3 (GOWN DISPOSABLE) ×1 IMPLANT
GOWN STRL REUS W/TWL XL LVL3 (GOWN DISPOSABLE) ×11 IMPLANT
HOLDER FOLEY CATH W/STRAP (MISCELLANEOUS) ×2 IMPLANT
IRRIG SUCT STRYKERFLOW 2 WTIP (MISCELLANEOUS)
IRRIGATION SUCT STRKRFLW 2 WTP (MISCELLANEOUS) IMPLANT
KIT BASIN OR (CUSTOM PROCEDURE TRAY) ×3 IMPLANT
LIQUID BAND (GAUZE/BANDAGES/DRESSINGS) ×2 IMPLANT
RELOAD 45 VASCULAR/THIN (ENDOMECHANICALS) ×3 IMPLANT
RELOAD STAPLE 45 2.5 WHT GRN (ENDOMECHANICALS) IMPLANT
RETRACTOR WND ALEXIS 25 LRG (MISCELLANEOUS) IMPLANT
RTRCTR WOUND ALEXIS 25CM LRG (MISCELLANEOUS) ×3
SCISSORS LAP 5X45 EPIX DISP (ENDOMECHANICALS) ×2 IMPLANT
SHEARS HARMONIC ACE PLUS 36CM (ENDOMECHANICALS) IMPLANT
SLEEVE XCEL OPT CAN 5 100 (ENDOMECHANICALS) ×4 IMPLANT
SOLUTION ANTI FOG 6CC (MISCELLANEOUS) ×1 IMPLANT
STRIP CLOSURE SKIN 1/2X4 (GAUZE/BANDAGES/DRESSINGS) IMPLANT
SUT PDS AB 1 TP1 96 (SUTURE) ×4 IMPLANT
SUT SILK 2 0 (SUTURE) ×3
SUT SILK 2-0 18XBRD TIE 12 (SUTURE) IMPLANT
SUT VIC AB 4-0 SH 18 (SUTURE) ×2 IMPLANT
SYR 30ML LL (SYRINGE) ×3 IMPLANT
TRAY FOLEY CATH 14FRSI W/METER (CATHETERS) ×2 IMPLANT
TRAY FOLEY W/METER SILVER 16FR (SET/KITS/TRAYS/PACK) ×1 IMPLANT
TRAY LAPAROSCOPIC (CUSTOM PROCEDURE TRAY) ×3 IMPLANT
TROCAR BLADELESS OPT 5 100 (ENDOMECHANICALS) ×2 IMPLANT
TROCAR XCEL NON-BLD 11X100MML (ENDOMECHANICALS) IMPLANT
TROCAR XCEL UNIV SLVE 11M 100M (ENDOMECHANICALS) IMPLANT
TUBING INSUF HEATED (TUBING) ×3 IMPLANT
YANKAUER SUCT BULB TIP 10FT TU (MISCELLANEOUS) ×2 IMPLANT

## 2015-10-26 NOTE — Anesthesia Procedure Notes (Signed)
Procedure Name: Intubation Date/Time: 10/26/2015 7:44 AM Performed by: Montel Clock Pre-anesthesia Checklist: Patient identified, Emergency Drugs available, Suction available, Patient being monitored and Timeout performed Patient Re-evaluated:Patient Re-evaluated prior to inductionOxygen Delivery Method: Circle system utilized Preoxygenation: Pre-oxygenation with 100% oxygen Intubation Type: IV induction, Rapid sequence and Cricoid Pressure applied Laryngoscope Size: Mac and 3 Grade View: Grade I Tube type: Oral Tube size: 7.5 mm Number of attempts: 1 Airway Equipment and Method: Stylet Placement Confirmation: ETT inserted through vocal cords under direct vision,  positive ETCO2 and breath sounds checked- equal and bilateral Secured at: 21 cm Tube secured with: Tape Dental Injury: Teeth and Oropharynx as per pre-operative assessment

## 2015-10-26 NOTE — Anesthesia Postprocedure Evaluation (Signed)
Anesthesia Post Note  Patient: KINZLIE PERRAULT  Procedure(s) Performed: Procedure(s) (LRB): diagnostic LAPAROSCOPY LAPAROTOMY AND APPENDECTOMY (N/A)  Patient location during evaluation: PACU Anesthesia Type: General Level of consciousness: awake and alert Pain management: pain level controlled Vital Signs Assessment: post-procedure vital signs reviewed and stable Respiratory status: spontaneous breathing, nonlabored ventilation, respiratory function stable and patient connected to nasal cannula oxygen Cardiovascular status: blood pressure returned to baseline and stable Postop Assessment: no signs of nausea or vomiting Anesthetic complications: no    Last Vitals:  Vitals:   10/26/15 1200 10/26/15 1219  BP:  (!) 141/71  Pulse:  97  Resp:  15  Temp: 36.7 C 36.8 C    Last Pain:  Vitals:   10/26/15 1250  TempSrc:   PainSc: 5                  Grenda Lora J

## 2015-10-26 NOTE — Anesthesia Preprocedure Evaluation (Addendum)
Anesthesia Evaluation  Patient identified by MRN, date of birth, ID band Patient awake    Reviewed: Allergy & Precautions, NPO status , Patient's Chart, lab work & pertinent test results  History of Anesthesia Complications (+) history of anesthetic complications  Airway Mallampati: II  TM Distance: >3 FB Neck ROM: Full    Dental no notable dental hx.    Pulmonary neg pulmonary ROS,    Pulmonary exam normal breath sounds clear to auscultation       Cardiovascular +CHF  Normal cardiovascular exam Rhythm:Regular Rate:Normal  ECHO 07-04-13:  Study Conclusions  - Left ventricle: The cavity size was normal. Systolic function was normal. The estimated ejection fraction was in the range of 55% to 60%. Wall motion was normal; there were no regional wall motion abnormalities. - Left atrium: The atrium was mildly dilated. - Atrial septum: No defect or patent foramen ovale was identified.   Neuro/Psych Anxiety negative neurological ROS     GI/Hepatic Neg liver ROS, GERD  ,  Endo/Other  Hypothyroidism   Renal/GU Renal disease  negative genitourinary   Musculoskeletal negative musculoskeletal ROS (+)   Abdominal (+) + obese,   Peds negative pediatric ROS (+)  Hematology negative hematology ROS (+)   Anesthesia Other Findings   Reproductive/Obstetrics negative OB ROS                            Anesthesia Physical Anesthesia Plan  ASA: III  Anesthesia Plan: General   Post-op Pain Management:    Induction: Intravenous  Airway Management Planned: Oral ETT  Additional Equipment:   Intra-op Plan:   Post-operative Plan: Extubation in OR  Informed Consent: I have reviewed the patients History and Physical, chart, labs and discussed the procedure including the risks, benefits and alternatives for the proposed anesthesia with the patient or authorized representative who has indicated  his/her understanding and acceptance.   Dental advisory given  Plan Discussed with: CRNA  Anesthesia Plan Comments:         Anesthesia Quick Evaluation

## 2015-10-26 NOTE — Transfer of Care (Signed)
Immediate Anesthesia Transfer of Care Note  Patient: Cynthia [name]  Procedure(s) Performed: Procedure(s): diagnostic LAPAROSCOPY LAPAROTOMY AND APPENDECTOMY (N/A)  Patient Location: PACU  Anesthesia Type:General  Level of Consciousness:  sedated, patient cooperative and responds to stimulation  Airway & Oxygen Therapy:Patient Spontanous Breathing and Patient connected to face mask oxgen  Post-op Assessment:  Report given to PACU RN and Post -op Vital signs reviewed and stable  Post vital signs:  Reviewed and stable  Last Vitals:  Vitals:   10/26/15 0618  BP: 109/63  Pulse: 74  Resp: 16  Temp: 37 C    Complications: No apparent anesthesia complications

## 2015-10-26 NOTE — Brief Op Note (Signed)
10/26/2015  9:59 AM  PATIENT:  Abelina Bachelor  50 y.o. female  PRE-OPERATIVE DIAGNOSIS:  intermittent abdominal pain and obstructive symptoms  POST-OPERATIVE DIAGNOSIS:  ABDOMINAL PAIN FLOPPY CECUM  PROCEDURE:  Procedure(s): diagnostic LAPAROSCOPY LAPAROTOMY AND APPENDECTOMY (N/A)  SURGEON:  Surgeon(s) and Role:    * Johnathan Hausen, MD - Primary    * Armandina Gemma, MD - Assisting  PHYSICIAN ASSISTANT:   ASSISTANTS: Armandina Gemma, MD, FACS   ANESTHESIA:   general  EBL:  Total I/O In: 1000 [I.V.:1000] Out: 61 [Urine:500; Other:100; Blood:10]  BLOOD ADMINISTERED:none  DRAINS: none   LOCAL MEDICATIONS USED:  BUPIVICAINE   SPECIMEN:  Source of Specimen:  appendix  DISPOSITION OF SPECIMEN:  PATHOLOGY  COUNTS:  YES  TOURNIQUET:  * No tourniquets in log *  DICTATION: .Other Dictation: Dictation Number [id number]  PLAN OF CARE: Admit for overnight observation  PATIENT DISPOSITION:  PACU - hemodynamically stable.   Delay start of Pharmacological VTE agent (>24hrs) due to surgical blood loss or risk of bleeding: no

## 2015-10-26 NOTE — H&P (Signed)
Chief Complaint:  Right sided abdominal pain  History of Present Illness:  Cynthia [name] is an 50 y.o. female who has been having right upper quadrant pain since April of this year.  Colonoscopy negative.  She had fecal vomitus at one point and questionalble partial sbo. Symptoms have been intermittent and now brought to OR for laparoscopy and possible laparotoy  Past Medical History:  Diagnosis Date  . Allergy   . Anxiety   . Bladder disorder   . Colitis 2006  . Colon polyps   . Complication of anesthesia    problems waking up  . Diastolic congestive heart failure ([place])    with pericarditis- no issues last 5 years 09-2015  . Diverticulosis   . GERD (gastroesophageal reflux disease)   . History of depression   . History of migraines   . Hyperlipemia   . Hypothyroidism   . Kidney stone   . Pericarditis   . Rapid heart rate    maybe once a month since pericarditis  . Rosacea   . SBO (small bowel obstruction) ([place]) 08/16/2015  . Vitamin D deficiency     Past Surgical History:  Procedure Laterality Date  . CARDIAC CATHETERIZATION  11/15/08   SMOOTH AND NORMAL  . CHOLECYSTECTOMY    . COLONOSCOPY    . LAPAROSCOPY    . POLYPECTOMY  02/2009  . SHOULDER SURGERY     left  . TOTAL ABDOMINAL HYSTERECTOMY W/ BILATERAL SALPINGOOPHORECTOMY    . UPPER GASTROINTESTINAL ENDOSCOPY      Current Facility-Administered Medications  Medication Dose Route Frequency Provider Last Rate Last Dose  . bupivacaine liposome (EXPAREL) 1.3 % injection 266 mg  20 mL Infiltration Once Johnathan Hausen, MD      . ceFAZolin (ANCEF) IVPB 2g/100 mL premix  2 g Intravenous On Call to [place], MD      . Chlorhexidine Gluconate Cloth 2 % PADS 6 each  6 each Topical Once Johnathan Hausen, MD       And  . Chlorhexidine Gluconate Cloth 2 % PADS 6 each  6 each Topical Once Johnathan Hausen, MD       Facility-Administered Medications Ordered in Other Encounters  Medication Dose Route Frequency Provider Last  Rate Last Dose  . lactated ringers infusion    Continuous PRN Montel Clock, CRNA       Amoxicillin; Ampicillin; Imitrex [sumatriptan]; Other; Prednisone; and Zoloft [sertraline hcl] Family History  Problem Relation Age of Onset  . Adopted: Yes   Social History:   reports that she has never smoked. She has never used smokeless tobacco. She reports that she does not drink alcohol or use drugs.   REVIEW OF SYSTEMS : Negative except for see problem list  Physical Exam:   Blood pressure 109/63, pulse 74, temperature 98.6 F (37 C), temperature source Oral, resp. rate 16, height 5\' 8"  (1.727 m), weight 104.8 kg (231 lb), SpO2 100 %. Body mass index is 35.12 kg/m.  Gen:  WDWN WF NAD  Neurological: Alert and oriented to person, place, and time. Motor and sensory function is grossly intact  Head: Normocephalic and atraumatic.  Eyes: Conjunctivae are normal. Pupils are equal, round, and reactive to light. No scleral icterus.  Neck: Normal range of motion. Neck supple. No tracheal deviation or thyromegaly present.  Cardiovascular:  SR without murmurs or gallops.  No carotid bruits Breast:  Not examined Respiratory: Effort normal.  No respiratory distress. No chest wall tenderness. Breath sounds normal.  No  wheezes, rales or rhonchi.  Abdomen:  No acute findings at present GU:  Prior hysterectomy Musculoskeletal: Normal range of motion. Extremities are nontender. No cyanosis, edema or clubbing noted Lymphadenopathy: No cervical, preauricular, postauricular or axillary adenopathy is present Skin: Skin is warm and dry. No rash noted. No diaphoresis. No erythema. No pallor. Pscyh: Normal mood and affect. Behavior is normal. Judgment and thought content normal.   LABORATORY RESULTS: No results found for this or any previous visit (from the past 48 hour(s)).   RADIOLOGY RESULTS: No results found.  Problem List: Patient Active Problem List   Diagnosis Date Noted  . Partial small bowel  obstruction ([place])   . Abdominal pain 08/16/2015  . Generalized abdominal pain   . Intractable cyclical vomiting with nausea   . Pericarditis   . Pericarditis, acute 05/03/2010  . Obesity 05/03/2010  . Diastolic congestive heart failure ([place])   . Hyperlipemia   . Hypothyroidism   . History of depression   . History of migraines   . Rosacea   . Anxiety     Assessment & Plan: Unexplained right upper quadrant pain.  Laparoscopy possible laparotomy    Matt B. Hassell Done, MD, [facility] [phone] beeper [phone]  10/26/2015 7:26 AM

## 2015-10-27 DIAGNOSIS — K388 Other specified diseases of appendix: Secondary | ICD-10-CM | POA: Diagnosis not present

## 2015-10-27 LAB — COMPREHENSIVE METABOLIC PANEL
ALBUMIN: 3.6 g/dL (ref 3.5–5.0)
ALK PHOS: 92 U/L (ref 38–126)
ALT: 18 U/L (ref 14–54)
ANION GAP: 7 (ref 5–15)
AST: 28 U/L (ref 15–41)
BILIRUBIN TOTAL: 0.8 mg/dL (ref 0.3–1.2)
BUN: 9 mg/dL (ref 6–20)
CALCIUM: 8.4 mg/dL — AB (ref 8.9–10.3)
CO2: 27 mmol/L (ref 22–32)
CREATININE: 0.75 mg/dL (ref 0.44–1.00)
Chloride: 99 mmol/L — ABNORMAL LOW (ref 101–111)
GFR calc non Af Amer: >60 mL/min (ref >60)
GLUCOSE: 277 mg/dL — AB (ref 65–99)
Potassium: 5.3 mmol/L — ABNORMAL HIGH (ref 3.5–5.1)
Sodium: 133 mmol/L — ABNORMAL LOW (ref 135–145)
TOTAL PROTEIN: 7 g/dL (ref 6.5–8.1)

## 2015-10-27 LAB — CBC
HEMATOCRIT: 38.4 % (ref 36.0–46.0)
HEMOGLOBIN: 12.3 g/dL (ref 12.0–15.0)
MCH: 30.1 pg (ref 26.0–34.0)
MCHC: 32 g/dL (ref 30.0–36.0)
MCV: 93.9 fL (ref 78.0–100.0)
Platelets: 268 10*3/uL (ref 150–400)
RBC: 4.09 MIL/uL (ref 3.87–5.11)
RDW: 13.5 % (ref 11.5–15.5)
WBC: 13.4 10*3/uL — ABNORMAL HIGH (ref 4.0–10.5)

## 2015-10-27 MED ORDER — FAMOTIDINE 20 MG PO TABS
20.0000 mg | ORAL_TABLET | Freq: Two times a day (BID) | ORAL | Status: DC
  Administered 2015-10-27 – 2015-10-31 (×8): 20 mg via ORAL
  Filled 2015-10-27 (×8): qty 1

## 2015-10-27 MED ORDER — METHOCARBAMOL 1000 MG/10ML IJ SOLN
500.0000 mg | Freq: Four times a day (QID) | INTRAVENOUS | Status: DC
  Administered 2015-10-27 – 2015-10-28 (×4): 500 mg via INTRAVENOUS
  Filled 2015-10-27: qty 550
  Filled 2015-10-27 (×2): qty 5
  Filled 2015-10-27 (×2): qty 550

## 2015-10-27 MED ORDER — DEXTROSE-NACL 5-0.9 % IV SOLN
INTRAVENOUS | Status: DC
  Administered 2015-10-27 (×2): via INTRAVENOUS

## 2015-10-27 NOTE — Op Note (Signed)
NAMEREGHAN, HANIFAN                  ACCOUNT NO.:  [id number]  MEDICAL RECORD NO.:  [id number]  LOCATION:  [id number]                         FACILITY:  [facility]  PHYSICIAN:  Isabel Caprice. Hassell Done, MD  DATE OF BIRTH:  October 26, 1965  DATE OF PROCEDURE: DATE OF DISCHARGE:                              OPERATIVE REPORT   PREOPERATIVE DIAGNOSIS:  Recurrent right-sided abdominal pain.  POSTOPERATIVE DIAGNOSIS:  Right-sided abdominal pain possibly from mobile right colon.  PROCEDURE:  Laparoscopy with laparotomy, appendectomy.  SURGEON:  Johnathan Hausen, MD.  ASSISTANT:  Armandina Gemma, MD.  ANESTHESIA:  General endotracheal.  DESCRIPTION OF PROCEDURE:  The patient was taken to [facility] and given general anesthesia.  The abdomen was prepped with chlorhexidine prep and draped sterilely.  Access to the abdomen was achieved through the left upper quadrant with a 5-mm Optiview and then 2 other 5 mm, 1 placed just below the umbilicus, 1 placed in the right lower quadrant.  The abdomen was insufflated, creating a nice pneumoperitoneum.  Liver appeared unremarkable.  More importantly, we looked down the pelvis and did not see any adhesions or any adhesions to the anterior abdominal wall.  I was able to grasp the omentum and carried this up toward the liver exposing the small bowel.  She seemed to have a fairly mobile transverse colon and I was able to go up into the hepatic flexure and then found that her ascending colon was quite mobile and the cecum could flop over to the midline.  I went ahead and found the terminal ileum and began working my way backwards.  She did have a bowel that initially appeared very normal in the terminal ileum and has moved into the jejunum.  It was more prominently with dilated lacteals.  Some of these were quite extraordinary.  We did not see any chylous ascites.  There were, however, dilated chyle tracts within the small bowel.  We ran this back proximally and the bowel was pretty  well floppy and twisted and could not get all the way back to the ligament of Treitz.  There were aspects of the floppiness that caused me to entertain high possible internal hernia. Also, I felt that palpation of the bowel could possibly detect masses that could contribute to intussusception.   To resolve that, I felt that it would be best with an upper midline incision which I made and placed a wound protector in.  We re- ran the bowel from the terminal limb back, moving it around, and identified the ligament of Treitz in the normal location. No palpable masses were felt in the small bowel.  The right colon had a moderately firm stool volume.   The cecum I was able to bring all the way well across the midline, so I think it was very mobile and there was an area there where could possibly have been a cecal bascule, although we have never seen this on a CT scan.  She did have a long appendix.  I went ahead and went through that mesentery with hemostats and 2-0 silks and then amputated at its base with Endo-GIA 4.5 with a vascular load.  I then tucked that  in the right lower quadrant hopefully to allow that to pex and seal to prevent migration or movement in the future.  The sponge and needle counts reported as correct.  The wound was closed with running double-stranded #1 PDS from above and below tying in the middle and then the wound was injected with Exparel diluted to 30 mL and then closed with 4-0 Vicryl and with staples.  The patient tolerated the procedure well, was taken to recovery room in satisfactory condition.  Pictures were taken of her dilated lacteals and were given to her husband.     Isabel Caprice Hassell Done, MD     MBM/MEDQ  D:  10/26/2015  T:  10/27/2015  Job:  [id number]

## 2015-10-27 NOTE — Progress Notes (Signed)
Key Points: Use following P&T approved IV to PO antibiotic change policy.  Description contains the criteria that are approved Note: Policy Excludes:  Esophagectomy patientsPHARMACIST - PHYSICIAN COMMUNICATION DR:   Hassell Done CONCERNING: IV to Oral Route Change Policy  RECOMMENDATION: This patient is receiving Pepcid by the intravenous route.  Based on criteria approved by the Pharmacy and Therapeutics Committee, the intravenous medication(s) is/are being converted to the equivalent oral dose form(s).   DESCRIPTION: These criteria include:  The patient is eating (either orally or via tube) and/or has been taking other orally administered medications for a least 24 hours  The patient has no evidence of active gastrointestinal bleeding or impaired GI absorption (gastrectomy, short bowel, patient on TNA or NPO).  If you have questions about this conversion, please contact the Pharmacy Department  []   [phone] )  [facility] []   [phone] )  [facility] []   [phone] )  [facility] []   [phone] )  [facility] [x]   [phone] )  [place], [place], [facility] 10/27/2015 10:54 AM

## 2015-10-27 NOTE — Progress Notes (Signed)
1 Day Post-Op  Subjective: A lot of incisional soreness.  One episode on n/v yesterday evening.  No flatus.  No nausea this AM.  Voiding well.  Husband in room.  Objective: Vital signs in last 24 hours: Temp:  [97.8 F (36.6 C)-98.8 F (37.1 C)] 98.8 F (37.1 C) (09/23 0543) Pulse Rate:  [83-106] 91 (09/23 0543) Resp:  [12-22] 16 (09/23 0543) BP: (104-141)/(61-78) 113/67 (09/23 0543) SpO2:  [94 %-100 %] 97 % (09/23 0543) Last BM Date: 10/26/15  Intake/Output from previous day: 09/22 0701 - 09/23 0700 In: 4453.3 [P.O.:480; I.V.:3873.3; IV Piggyback:100] Out: 3260 [Urine:3150; Blood:10] Intake/Output this shift: No intake/output data recorded.  PE: General- In NAD Abdomen-soft, few bowel sounds, wound clean and intact  Lab Results:   Recent Labs  10/27/15 0446  WBC 13.4*  HGB 12.3  HCT 38.4  PLT 268   BMET  Recent Labs  10/27/15 0446  NA 133*  K 5.3*  CL 99*  CO2 27  GLUCOSE 277*  BUN 9  CREATININE 0.75  CALCIUM 8.4*   PT/INR No results for input(s): LABPROT, INR in the last 72 hours. Comprehensive Metabolic Panel:    Component Value Date/Time   NA 133 (L) 10/27/2015 0446   NA 139 10/23/2015 1345   K 5.3 (H) 10/27/2015 0446   K 3.5 10/23/2015 1345   CL 99 (L) 10/27/2015 0446   CL 102 10/23/2015 1345   CO2 27 10/27/2015 0446   CO2 29 10/23/2015 1345   BUN 9 10/27/2015 0446   BUN 9 10/23/2015 1345   CREATININE 0.75 10/27/2015 0446   CREATININE 0.79 10/23/2015 1345   GLUCOSE 277 (H) 10/27/2015 0446   GLUCOSE 137 (H) 10/23/2015 1345   CALCIUM 8.4 (L) 10/27/2015 0446   CALCIUM 9.0 10/23/2015 1345   AST 28 10/27/2015 0446   AST 24 08/17/2015 0352   ALT 18 10/27/2015 0446   ALT 17 08/17/2015 0352   ALKPHOS 92 10/27/2015 0446   ALKPHOS 127 (H) 08/17/2015 0352   BILITOT 0.8 10/27/2015 0446   BILITOT 1.0 08/17/2015 0352   PROT 7.0 10/27/2015 0446   PROT 7.0 08/17/2015 0352   ALBUMIN 3.6 10/27/2015 0446   ALBUMIN 3.7 08/17/2015 0352      Studies/Results: No results found.  Anti-infectives: Anti-infectives    Start     Dose/Rate Route Frequency Ordered Stop   10/26/15 0607  ceFAZolin (ANCEF) IVPB 2g/100 mL premix     2 g 200 mL/hr over 30 Minutes Intravenous On call to O.R. 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   LOS: 0 days   Plan: Ambulate.  Add Robaxin.  Full liquids.   Mairlyn Tegtmeyer Lenna Sciara 10/27/2015

## 2015-10-28 DIAGNOSIS — K562 Volvulus: Secondary | ICD-10-CM

## 2015-10-28 DIAGNOSIS — K388 Other specified diseases of appendix: Secondary | ICD-10-CM | POA: Diagnosis not present

## 2015-10-28 LAB — BASIC METABOLIC PANEL
ANION GAP: 8 (ref 5–15)
BUN: 15 mg/dL (ref 6–20)
CALCIUM: 9.2 mg/dL (ref 8.9–10.3)
CO2: 29 mmol/L (ref 22–32)
CREATININE: 0.74 mg/dL (ref 0.44–1.00)
Chloride: 102 mmol/L (ref 101–111)
GFR calc Af Amer: >60 mL/min (ref >60)
GLUCOSE: 98 mg/dL (ref 65–99)
Potassium: 3.6 mmol/L (ref 3.5–5.1)
Sodium: 139 mmol/L (ref 135–145)

## 2015-10-28 MED ORDER — KETOROLAC TROMETHAMINE 30 MG/ML IJ SOLN
30.0000 mg | Freq: Four times a day (QID) | INTRAMUSCULAR | Status: DC
Start: 2015-10-28 — End: 2015-10-30
  Administered 2015-10-28: 30 mg via INTRAVENOUS
  Filled 2015-10-28: qty 1

## 2015-10-28 MED ORDER — ONDANSETRON HCL 4 MG/2ML IJ SOLN: 4.0000 mg | INTRAMUSCULAR | Status: DC | PRN

## 2015-10-28 MED ORDER — ONDANSETRON 4 MG PO TBDP
4.0000 mg | ORAL_TABLET | ORAL | Status: DC | PRN
  Administered 2015-10-30: 4 mg via ORAL
  Filled 2015-10-28: qty 1

## 2015-10-28 MED ORDER — OXYCODONE HCL 5 MG PO TABS
5.0000 mg | ORAL_TABLET | ORAL | Status: DC | PRN
  Administered 2015-10-28: 10 mg via ORAL
  Administered 2015-10-28: 5 mg via ORAL
  Administered 2015-10-28: 10 mg via ORAL
  Administered 2015-10-28: 5 mg via ORAL
  Administered 2015-10-29 – 2015-10-31 (×9): 10 mg via ORAL
  Filled 2015-10-28 (×8): qty 2
  Filled 2015-10-28: qty 1
  Filled 2015-10-28 (×3): qty 2
  Filled 2015-10-28: qty 1

## 2015-10-28 MED ORDER — METHOCARBAMOL 500 MG PO TABS
500.0000 mg | ORAL_TABLET | Freq: Four times a day (QID) | ORAL | Status: DC
  Administered 2015-10-28 – 2015-10-31 (×12): 500 mg via ORAL
  Filled 2015-10-28 (×12): qty 1

## 2015-10-28 NOTE — Progress Notes (Signed)
Patient called for nurse to look at left forearm. Patient said it has been burning. After assessing left anterior forearm, patient had developed blisters from tape that had been applied for the iv site. Sites were cleansed and covered with several tegaderm. MD was notified via [place].

## 2015-10-28 NOTE — Progress Notes (Signed)
2 Days Post-Op  Subjective: Pain a little less this AM.  Got very full after eating a grilled cheese sandwich yesterday.  No BM or flatus.  IV infiltrated in left arm last night.  Had difficulty getting another IV in. Objective: Vital signs in last 24 hours: Temp:  [98.5 F (36.9 C)-98.9 F (37.2 C)] 98.5 F (36.9 C) (09/24 0530) Pulse Rate:  [75-89] 75 (09/24 0530) Resp:  [15-16] 16 (09/24 0530) BP: (108-119)/(57-70) 119/68 (09/24 0530) SpO2:  [93 %-98 %] 93 % (09/24 0530) Last BM Date: 10/26/15  Intake/Output from previous day: 09/23 0701 - 09/24 0700 In: 2935 [P.O.:600; I.V.:2170; IV Piggyback:165] Out: 2800 [Urine:2800] Intake/Output this shift: No intake/output data recorded.  PE: General- In NAD Abdomen-soft, few bowel sounds, wound clean and intact Extr-left hand and forearm swollen  Lab Results:   Recent Labs  10/27/15 0446  WBC 13.4*  HGB 12.3  HCT 38.4  PLT 268   BMET  Recent Labs  10/27/15 0446 10/28/15 0429  NA 133* 139  K 5.3* 3.6  CL 99* 102  CO2 27 29  GLUCOSE 277* 98  BUN 9 15  CREATININE 0.75 0.74  CALCIUM 8.4* 9.2   PT/INR No results for input(s): LABPROT, INR in the last 72 hours. Comprehensive Metabolic Panel:    Component Value Date/Time   NA 139 10/28/2015 0429   NA 133 (L) 10/27/2015 0446   K 3.6 10/28/2015 0429   K 5.3 (H) 10/27/2015 0446   CL 102 10/28/2015 0429   CL 99 (L) 10/27/2015 0446   CO2 29 10/28/2015 0429   CO2 27 10/27/2015 0446   BUN 15 10/28/2015 0429   BUN 9 10/27/2015 0446   CREATININE 0.74 10/28/2015 0429   CREATININE 0.75 10/27/2015 0446   GLUCOSE 98 10/28/2015 0429   GLUCOSE 277 (H) 10/27/2015 0446   CALCIUM 9.2 10/28/2015 0429   CALCIUM 8.4 (L) 10/27/2015 0446   AST 28 10/27/2015 0446   AST 24 08/17/2015 0352   ALT 18 10/27/2015 0446   ALT 17 08/17/2015 0352   ALKPHOS 92 10/27/2015 0446   ALKPHOS 127 (H) 08/17/2015 0352   BILITOT 0.8 10/27/2015 0446   BILITOT 1.0 08/17/2015 0352   PROT 7.0  10/27/2015 0446   PROT 7.0 08/17/2015 0352   ALBUMIN 3.6 10/27/2015 0446   ALBUMIN 3.7 08/17/2015 0352     Studies/Results: No results found.  Anti-infectives: Anti-infectives    Start     Dose/Rate Route Frequency Ordered Stop   10/26/15 0607  ceFAZolin (ANCEF) IVPB 2g/100 mL premix     2 g 200 mL/hr over 30 Minutes Intravenous On call to O.R. 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   LOS: 0 days   Plan: Add Toradol.  Advance diet.  Leave IV out if it stops working.   Cynthia [name] Lenna Sciara 10/28/2015

## 2015-10-28 NOTE — Progress Notes (Signed)
Pt c/o new IV "burning" when I increased rate to 75.  I reduced rate to 50 and pt stated that her site felt better.  Will continue to monitor.

## 2015-10-28 NOTE — Progress Notes (Signed)
IV team had a difficult time trying to find a place for a new IV when pt's fell out.  The only place she can find is her left elbow.  IV nurse stated that pt may need a picc line if she needs to have fluids for another day or so.

## 2015-10-29 DIAGNOSIS — K388 Other specified diseases of appendix: Secondary | ICD-10-CM | POA: Diagnosis not present

## 2015-10-29 NOTE — Care Management Note (Signed)
Case Management Note  Patient Details  Name: Cynthia [name] MRN: [id number] Date of Birth: 1965/03/26  Subjective/Objective:  50 y/o f admitted w/abdominal pain. From home.s/p open appy.                  Action/Plan:d/c plan home.   Expected Discharge Date:                  Expected Discharge Plan:  Home/Self Care  In-House Referral:     Discharge planning Services  CM Consult  Post Acute Care Choice:    Choice offered to:     DME Arranged:    DME Agency:     HH Arranged:    HH Agency:     Status of Service:  In process, will continue to follow  If discussed at Long Length of Stay Meetings, dates discussed:    Additional Comments:  Dessa Phi, RN 10/29/2015, 12:44 PM

## 2015-10-29 NOTE — Progress Notes (Signed)
Patient complains of burning in the area where the  Blisters are on left forearm, dressing intact. Explained that the dressing changes would be twice a day. No redness above or below dressing, left radial pulse inrtact   D Cynthia [name]

## 2015-10-29 NOTE — Progress Notes (Signed)
Patient ID: Cynthia [name], female   DOB: 1965-06-03, 50 y.o.   MRN: [id number] [facility] Surgery Progress Note:   3 Days Post-Op  Subjective: Mental status is clear.  Left arm is hurting from IV infiltration Objective: Vital signs in last 24 hours: Temp:  [98.5 F (36.9 C)-98.7 F (37.1 C)] 98.7 F (37.1 C) (09/25 1424) Pulse Rate:  [70-85] 85 (09/25 1424) Resp:  [16] 16 (09/25 1424) BP: (100-109)/(54-59) 109/59 (09/25 1424) SpO2:  [95 %-98 %] 98 % (09/25 1424)  Intake/Output from previous day: 09/24 0701 - 09/25 0700 In: 817 [P.O.:520; I.V.:297] Out: -  Intake/Output this shift: No intake/output data recorded.  Physical Exam: Work of breathing is normal.  Taking some clear liquids.  Main complaint is arm  Lab Results:  Results for orders placed or performed during the hospital encounter of 10/26/15 (from the past 48 hour(s))  Basic metabolic panel     Status: None   Collection Time: 10/28/15  4:29 AM  Result Value Ref Range   Sodium 139 135 - 145 mmol/L   Potassium 3.6 3.5 - 5.1 mmol/L    Comment: DELTA CHECK NOTED   Chloride 102 101 - 111 mmol/L   CO2 29 22 - 32 mmol/L   Glucose, Bld 98 65 - 99 mg/dL   BUN 15 6 - 20 mg/dL   Creatinine, Ser 0.74 0.44 - 1.00 mg/dL   Calcium 9.2 8.9 - 10.3 mg/dL   GFR calc non Af Amer >60 >60 mL/min   GFR calc Af Amer >60 >60 mL/min    Comment: (NOTE) The eGFR has been calculated using the CKD EPI equation. This calculation has not been validated in all clinical situations. eGFR's persistently <60 mL/min signify possible Chronic Kidney Disease.    Anion gap 8 5 - 15    Radiology/Results: No results found.  Anti-infectives: Anti-infectives    Start     Dose/Rate Route Frequency Ordered Stop   10/26/15 0607  ceFAZolin (ANCEF) IVPB 2g/100 mL premix     2 g 200 mL/hr over 30 Minutes Intravenous On call to O.R. 10/26/15 0607 10/26/15 0745      Assessment/Plan: Problem List: Patient Active Problem List   Diagnosis Date  Noted  . 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 Partial small bowel obstruction ([place])   . Abdominal pain 08/16/2015  . Generalized abdominal pain   . Intractable cyclical vomiting with nausea   . Pericarditis   . Pericarditis, acute 05/03/2010  . Obesity 05/03/2010  . Diastolic congestive heart failure ([place])   . Hyperlipemia   . Hypothyroidism   . History of depression   . History of migraines   . Rosacea   . Anxiety     Continue observation ;  Lori McNichol seeing and treating the swollen and blistered left arm.  Maintain elevation. 3 Days Post-Op    LOS: 0 days   Matt B. Hassell Done, MD, [facility] [phone] beeper [phone]  10/29/2015 9:00 PM

## 2015-10-29 NOTE — Consult Note (Addendum)
[place] Nurse wound consult note Reason for Consult: left posterior forearm IVF infiltrate with ruptured blisters Wound type:partial thickness tissue loss; traumatic Pressure Ulcer POA: No Measurement: Affected areas measures 15cm x 8ch with approximately 3 fluid filled and 6 ruptured blisters, the largest of which is rutpured and measures 3cm x 4cm x 0.1cm.  There is an area of light purple discoloration and mild induration in the center measuring 3cm x 4.5cm. Wound [id number], moist Drainage (amount, consistency, odor) serous in a moderate amount Periwound:intact Dressing procedure/placement/frequency:I will implement a single layer of xeroform Kellie Simmering [phone]) gauze to cover, and act as both an astringent and an antimicrobial, top with an ABD and secure with a Kerlix roll gauze with twice daily changes. This frequency will also allow for frequent monitoring. [place] nursing team will not follow, but will remain available to this patient, the nursing and medical teams.  Please re-consult if needed. Thanks, Maudie Flakes, MSN, RN, [place], Arther Abbott  Pager# [phone]

## 2015-10-30 DIAGNOSIS — K388 Other specified diseases of appendix: Secondary | ICD-10-CM | POA: Diagnosis not present

## 2015-10-30 MED ORDER — GABAPENTIN 100 MG PO CAPS
100.0000 mg | ORAL_CAPSULE | Freq: Two times a day (BID) | ORAL | Status: DC
  Administered 2015-10-30 – 2015-10-31 (×2): 100 mg via ORAL
  Filled 2015-10-30 (×2): qty 1

## 2015-10-30 NOTE — Progress Notes (Signed)
Patient ID: Cynthia [name], female   DOB: 06/08/1965, 50 y.o.   MRN: [id number] [facility] Surgery Progress Note:   4 Days Post-Op  Subjective: Mental status is clear.  Having a lot of burning pain in her left antecubital area Objective: Vital signs in last 24 hours: Temp:  [98.6 F (37 C)-98.7 F (37.1 C)] 98.6 F (37 C) (09/26 0545) Pulse Rate:  [82-86] 82 (09/26 0545) Resp:  [16] 16 (09/26 0545) BP: (109-124)/(59-61) 124/61 (09/26 0545) SpO2:  [93 %-98 %] 93 % (09/26 0545)  Intake/Output from previous day: 09/25 0701 - 09/26 0700 In: 360 [P.O.:360] Out: 3 [Urine:3] Intake/Output this shift: No intake/output data recorded.  Physical Exam: Work of breathing is normal.  Left side of face has flushing.  Arm with blisters popped and covered with xeroform.  Grip symmetric and normal feeling hand  Lab Results:  No results found for this or any previous visit (from the past 48 hour(s)).  Radiology/Results: No results found.  Anti-infectives: Anti-infectives    Start     Dose/Rate Route Frequency Ordered Stop   10/26/15 0607  ceFAZolin (ANCEF) IVPB 2g/100 mL premix     2 g 200 mL/hr over 30 Minutes Intravenous On call to O.R. 10/26/15 0607 10/26/15 0745      Assessment/Plan: Problem List: Patient Active Problem List   Diagnosis Date Noted  . 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 Partial small bowel obstruction ([place])   . Abdominal pain 08/16/2015  . Generalized abdominal pain   . Intractable cyclical vomiting with nausea   . Pericarditis   . Pericarditis, acute 05/03/2010  . Obesity 05/03/2010  . Diastolic congestive heart failure ([place])   . Hyperlipemia   . Hypothyroidism   . History of depression   . History of migraines   . Rosacea   . Anxiety     Neural pain in the left antecubital area.  Will offer gabapentin for nerve pain.   4 Days Post-Op    LOS: 0 days   Matt B. Hassell Done, MD, [facility] [phone]  beeper [phone]  10/30/2015 12:01 PM

## 2015-10-31 DIAGNOSIS — K388 Other specified diseases of appendix: Secondary | ICD-10-CM | POA: Diagnosis not present

## 2015-10-31 MED ORDER — GABAPENTIN 100 MG PO CAPS: 100.0000 mg | ORAL_CAPSULE | Freq: Two times a day (BID) | ORAL | 1 refills | Status: DC

## 2015-10-31 MED ORDER — OXYCODONE HCL 5 MG PO TABS: 5.0000 mg | ORAL_TABLET | ORAL | 0 refills | Status: DC | PRN

## 2015-10-31 NOTE — Discharge Summary (Signed)
Physician Discharge Summary  Patient ID: Cynthia [name] MRN: [id number] DOB/AGE: 48-01-1966 48 y.o.  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  Bowel function was slower to return.  Over weekend IV in left antecubital area infiltrated and she sustained a chemical burn with blistering and pain.  This required continued hospitalization until discharge on Wednesday.  Arrangements made for home health care.    Consults: none  Significant Diagnostic Studies: none    Discharge Exam: Blood pressure 127/75, pulse 86, temperature 98.5 F (36.9 C), temperature source Oral, resp. rate 18, height 5\' 8"  (1.727 m), weight 104.8 kg (231 lb), SpO2 96 %. Left antecubital area with healing blisters and still with swelling.  Home health care to check daily.    Disposition: 01-Home or Self Care  Discharge Instructions    Diet - low sodium heart healthy    Complete by:  As directed    Discharge instructions    Complete by:  As directed    Dressing changes to left arm per home health care QD Staple removal at [place] office next Monday.  Call [phone] to schedule   Increase activity slowly    Complete by:  As directed        Medication List    TAKE these medications   ALPRAZolam 1 MG tablet Commonly known as:  XANAX Take 0.5 mg by mouth at bedtime as needed for anxiety.   ESTRATEST PO Take 1 tablet by mouth daily.   gabapentin 100 MG capsule Commonly known as:  NEURONTIN Take 1 capsule (100 mg total) by mouth 2 (two) times daily.   glycopyrrolate 2 MG tablet Commonly known as:  ROBINUL Take 1 tablet (2 mg total) by mouth 2 (two) times daily.   ibuprofen 200 MG  tablet Commonly known as:  ADVIL,MOTRIN Take 200 mg by mouth every 6 (six) hours as needed for moderate pain.   lamoTRIgine 200 MG tablet Commonly known as:  LAMICTAL Take 200 mg by mouth at bedtime.   levothyroxine 75 MCG tablet Commonly known as:  SYNTHROID, LEVOTHROID Take 75 mcg by mouth daily.   metoprolol tartrate 25 MG tablet Commonly known as:  LOPRESSOR TAKE 1 TABLET BY MOUTH 2 TIMES DAILY What changed:  See the new instructions.   ondansetron 8 MG disintegrating tablet Commonly known as:  ZOFRAN ODT Take 1 tab every 6 hours as needed for nausea.   oxyCODONE 5 MG immediate release tablet Commonly known as:  Oxy IR/ROXICODONE Take 1-2 tablets (5-10 mg total) by mouth every 4 (four) hours as needed for moderate pain.   pantoprazole 20 MG tablet Commonly known as:  PROTONIX Take 1 tablet (20 mg total) by mouth daily.   QUEtiapine 300 MG 24 hr tablet Commonly known as:  SEROQUEL XR Take 300 mg by mouth at bedtime.   rizatriptan 10 MG tablet Commonly known as:  MAXALT Take 10 mg by mouth daily as needed for migraine. May repeat in 2 hours if needed   zolpidem 10 MG tablet Commonly known as:  AMBIEN Take 10 mg by mouth at bedtime as needed for sleep.      Follow-up Information    Pedro Earls, MD .  Specialty:  General Surgery Contact information: [place] [address] [phone]           Signed: Pedro Earls 10/31/2015, 9:03 AM

## 2015-10-31 NOTE — Progress Notes (Signed)
Dressing changed completed per Doctor order. LUE  Elevated on pillow with ice pack. Will continue to monitor for any drainage break thru.

## 2015-10-31 NOTE — Care Management Note (Signed)
Case Management Note  Patient Details  Name: Cynthia [name] MRN: [id number] Date of Birth: 01/04/1966  Subjective/Objective:                    Action/Plan:   Expected Discharge Date:                  Expected Discharge Plan:  Home w Home Health Services  In-House Referral:     Discharge planning Services  CM Consult  Post Acute Care Choice:  Home Health Choice offered to:  Patient  DME Arranged:  N/A DME Agency:  NA  HH Arranged:  RN HH Agency:  [facility]  Status of Service:  Completed, signed off  If discussed at Long Length of Stay Meetings, dates discussed:    Additional Comments: Cm met with pt in room to offer choice of home health agency.  Pt chooses [facility] to render HHRn for dressing changes.  Referral called to Susan of [facility] with physical address [address] [place] [address].  No other CM needs were communicated. ,  Christine, RN 10/31/2015, 9:04 AM  

## 2015-11-06 ENCOUNTER — Observation Stay (HOSPITAL_COMMUNITY)
Admission: EM | Admit: 2015-11-06 | Discharge: 2015-11-08 | Disposition: A | Discharge status: Home / Self Care | Payer: 59 | Attending: Family Medicine | Admitting: Family Medicine

## 2015-11-06 ENCOUNTER — Encounter (HOSPITAL_COMMUNITY): Payer: Self-pay | Admitting: Emergency Medicine

## 2015-11-06 DIAGNOSIS — H9201 Otalgia, right ear: Secondary | ICD-10-CM | POA: Diagnosis present

## 2015-11-06 DIAGNOSIS — E669 Obesity, unspecified: Secondary | ICD-10-CM | POA: Insufficient documentation

## 2015-11-06 DIAGNOSIS — G43909 Migraine, unspecified, not intractable, without status migrainosus: Secondary | ICD-10-CM | POA: Diagnosis not present

## 2015-11-06 DIAGNOSIS — I5030 Unspecified diastolic (congestive) heart failure: Secondary | ICD-10-CM | POA: Diagnosis present

## 2015-11-06 DIAGNOSIS — E039 Hypothyroidism, unspecified: Secondary | ICD-10-CM | POA: Diagnosis present

## 2015-11-06 DIAGNOSIS — Z79899 Other long term (current) drug therapy: Secondary | ICD-10-CM | POA: Diagnosis not present

## 2015-11-06 DIAGNOSIS — H60501 Unspecified acute noninfective otitis externa, right ear: Principal | ICD-10-CM | POA: Insufficient documentation

## 2015-11-06 DIAGNOSIS — I5032 Chronic diastolic (congestive) heart failure: Secondary | ICD-10-CM | POA: Insufficient documentation

## 2015-11-06 DIAGNOSIS — H60391 Other infective otitis externa, right ear: Secondary | ICD-10-CM

## 2015-11-06 DIAGNOSIS — Z8669 Personal history of other diseases of the nervous system and sense organs: Secondary | ICD-10-CM

## 2015-11-06 DIAGNOSIS — Z79890 Hormone replacement therapy (postmenopausal): Secondary | ICD-10-CM | POA: Insufficient documentation

## 2015-11-06 DIAGNOSIS — Z6835 Body mass index (BMI) 35.0-35.9, adult: Secondary | ICD-10-CM | POA: Diagnosis not present

## 2015-11-06 DIAGNOSIS — Y848 Other medical procedures as the cause of abnormal reaction of the patient, or of later complication, without mention of misadventure at the time of the procedure: Secondary | ICD-10-CM | POA: Diagnosis not present

## 2015-11-06 DIAGNOSIS — H6021 Malignant otitis externa, right ear: Secondary | ICD-10-CM | POA: Diagnosis present

## 2015-11-06 DIAGNOSIS — T8089XA Other complications following infusion, transfusion and therapeutic injection, initial encounter: Secondary | ICD-10-CM | POA: Insufficient documentation

## 2015-11-06 DIAGNOSIS — F329 Major depressive disorder, single episode, unspecified: Secondary | ICD-10-CM | POA: Insufficient documentation

## 2015-11-06 DIAGNOSIS — I4729 Other ventricular tachycardia: Secondary | ICD-10-CM

## 2015-11-06 DIAGNOSIS — F419 Anxiety disorder, unspecified: Secondary | ICD-10-CM | POA: Diagnosis present

## 2015-11-06 DIAGNOSIS — Z8659 Personal history of other mental and behavioral disorders: Secondary | ICD-10-CM

## 2015-11-06 DIAGNOSIS — I472 Ventricular tachycardia: Secondary | ICD-10-CM

## 2015-11-06 DIAGNOSIS — H6020 Malignant otitis externa, unspecified ear: Secondary | ICD-10-CM | POA: Diagnosis present

## 2015-11-06 DIAGNOSIS — A419 Sepsis, unspecified organism: Secondary | ICD-10-CM | POA: Diagnosis present

## 2015-11-06 HISTORY — DX: Ventricular tachycardia: I47.2

## 2015-11-06 LAB — CBC
HCT: 39.7 % (ref 36.0–46.0)
HEMOGLOBIN: 12.9 g/dL (ref 12.0–15.0)
MCH: 30.5 pg (ref 26.0–34.0)
MCHC: 32.5 g/dL (ref 30.0–36.0)
MCV: 93.9 fL (ref 78.0–100.0)
PLATELETS: 234 10*3/uL (ref 150–400)
RBC: 4.23 MIL/uL (ref 3.87–5.11)
RDW: 13.6 % (ref 11.5–15.5)
WBC: 13.6 10*3/uL — ABNORMAL HIGH (ref 4.0–10.5)

## 2015-11-06 LAB — COMPREHENSIVE METABOLIC PANEL
ALBUMIN: 3.7 g/dL (ref 3.5–5.0)
ALK PHOS: 112 U/L (ref 38–126)
ALT: 22 U/L (ref 14–54)
ANION GAP: 8 (ref 5–15)
AST: 30 U/L (ref 15–41)
BILIRUBIN TOTAL: 0.3 mg/dL (ref 0.3–1.2)
BUN: <5 mg/dL — ABNORMAL LOW (ref 6–20)
CALCIUM: 9.5 mg/dL (ref 8.9–10.3)
CO2: 29 mmol/L (ref 22–32)
CREATININE: 0.74 mg/dL (ref 0.44–1.00)
Chloride: 101 mmol/L (ref 101–111)
GFR calc non Af Amer: >60 mL/min (ref >60)
GLUCOSE: 113 mg/dL — AB (ref 65–99)
Potassium: 4 mmol/L (ref 3.5–5.1)
Sodium: 138 mmol/L (ref 135–145)
TOTAL PROTEIN: 7.1 g/dL (ref 6.5–8.1)

## 2015-11-06 LAB — LIPASE, BLOOD: Lipase: 22 U/L (ref 11–51)

## 2015-11-06 LAB — I-STAT CG4 LACTIC ACID, ED: LACTIC ACID, VENOUS: 1.12 mmol/L (ref 0.5–1.9)

## 2015-11-06 MED ORDER — IOPAMIDOL (ISOVUE-300) INJECTION 61%
INTRAVENOUS | Status: AC
  Administered 2015-11-07: 75 mL
  Filled 2015-11-06: qty 75

## 2015-11-06 MED ORDER — SODIUM CHLORIDE 0.9 % IV BOLUS (SEPSIS)
1000.0000 mL | Freq: Once | INTRAVENOUS | Status: AC
  Administered 2015-11-07: 1000 mL via INTRAVENOUS

## 2015-11-06 MED ORDER — ONDANSETRON HCL 4 MG/2ML IJ SOLN: 4.0000 mg | Freq: Once | INTRAMUSCULAR | Status: DC

## 2015-11-06 MED ORDER — CIPROFLOXACIN IN D5W 400 MG/200ML IV SOLN
400.0000 mg | Freq: Once | INTRAVENOUS | Status: AC
  Administered 2015-11-07: 400 mg via INTRAVENOUS
  Filled 2015-11-06: qty 200

## 2015-11-06 MED ORDER — FENTANYL CITRATE (PF) 100 MCG/2ML IJ SOLN: 25.0000 ug | Freq: Once | INTRAMUSCULAR | Status: DC

## 2015-11-06 MED ORDER — KETOROLAC TROMETHAMINE 30 MG/ML IJ SOLN: 30.0000 mg | Freq: Once | INTRAMUSCULAR | Status: DC

## 2015-11-06 NOTE — ED Triage Notes (Signed)
Pt states "I have an right ear infection and I went to my PCP, swabbed her ear, gave a shot of antibiotics, rocephin, and sent me home". Since this afternoon, pain has gotten progressively worse, trouble hearing out of her ear. Pt had abdominal surgery two weeks ago for small bowel obstruction. Pt tachycardic in 120s.

## 2015-11-06 NOTE — ED Notes (Signed)
Have made two attempts to gain venous access, without success.

## 2015-11-06 NOTE — ED Notes (Signed)
Nurse drawing labs. 

## 2015-11-06 NOTE — ED Provider Notes (Signed)
[facility] DEPT Provider Note   CSN: [id number] Arrival date & time: 11/06/15  1832     History   Chief Complaint Chief Complaint  Patient presents with  . Otalgia    HPI Cynthia [name] is a 50 y.o. female.  50 year old female with a history of dyslipidemia, hypothyroidism, esophageal reflux, small bowel obstruction (s/p laparoscopy and laparotomy and appendectomy on 10/26/15), and anxiety presents to the emergency department for evaluation of right ear pain. Patient states that she noted her right ear hurting 2 days ago. She states that the pain has become increasingly severe and was not relieved with oxycodone taken this afternoon. She states that she has developed new hearing loss. She has had purulent drainage from her ear canal. She reports a temperature of 100F yesterday. Patient saw her primary care doctor today who gave her a shot of Rocephin and sent her home on ciprofloxacin. She did not yet start these oral antibiotics. She states that she has been followed by an ENT physician in the past for a Pseudomonas infection; patient saw Dr. Thornell Mule. She denies bloody ear drainage, neck stiffness, and vomiting.   The history is provided by the patient. No language interpreter was used.  Otalgia  Associated symptoms include ear discharge and neck pain.    Past Medical History:  Diagnosis Date  . Allergy   . Anxiety   . Bladder disorder   . Colitis 2006  . Colon polyps   . Complication of anesthesia    problems waking up  . Diastolic congestive heart failure ([place])    with pericarditis- no issues last 5 years 09-2015  . Diverticulosis   . GERD (gastroesophageal reflux disease)   . History of depression   . History of migraines   . Hyperlipemia   . Hypothyroidism   . Kidney stone   . Pericarditis   . Rapid heart rate    maybe once a month since pericarditis  . Rosacea   . SBO (small bowel obstruction) 08/16/2015  . Vitamin D deficiency     Patient Active Problem List     Diagnosis Date Noted  . 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 Partial small bowel obstruction   . Abdominal pain 08/16/2015  . Generalized abdominal pain   . Intractable cyclical vomiting with nausea   . Pericarditis   . Pericarditis, acute 05/03/2010  . Obesity 05/03/2010  . Diastolic congestive heart failure ([place])   . Hyperlipemia   . Hypothyroidism   . History of depression   . History of migraines   . Rosacea   . Anxiety     Past Surgical History:  Procedure Laterality Date  . CARDIAC CATHETERIZATION  11/15/08   SMOOTH AND NORMAL  . CHOLECYSTECTOMY    . COLONOSCOPY    . LAPAROSCOPY    . LAPAROSCOPY N/A 10/26/2015   Procedure: diagnostic LAPAROSCOPY LAPAROTOMY AND APPENDECTOMY;  Surgeon: Johnathan Hausen, MD;  Location: [facility] ORS;  Service: General;  Laterality: N/A;  . POLYPECTOMY  02/2009  . SHOULDER SURGERY     left  . TOTAL ABDOMINAL HYSTERECTOMY W/ BILATERAL SALPINGOOPHORECTOMY    . UPPER GASTROINTESTINAL ENDOSCOPY      OB History    No data available       Home Medications    Prior to Admission medications   Medication Sig Start Date End Date Taking? Authorizing Provider  ALPRAZolam Duanne Moron) 1 MG tablet Take 0.5 mg by mouth at bedtime as needed for anxiety.   Yes Historical Provider, MD  Est Estrogens-Methyltest (ESTRATEST PO) Take 1 tablet by mouth daily.    Yes Historical Provider, MD  gabapentin (NEURONTIN) 100 MG capsule Take 1 capsule (100 mg total) by mouth 2 (two) times daily. 10/31/15 11/15/15 Yes Johnathan Hausen, MD  ibuprofen (ADVIL,MOTRIN) 200 MG tablet Take 200 mg by mouth every 6 (six) hours as needed for moderate pain.   Yes Historical Provider, MD  lamoTRIgine (LAMICTAL) 200 MG tablet Take 200 mg by mouth at bedtime.  06/29/13  Yes Historical Provider, MD  levothyroxine (SYNTHROID, LEVOTHROID) 75 MCG tablet Take 75 mcg by mouth at bedtime.    Yes Historical Provider, MD  metoprolol tartrate (LOPRESSOR) 25 MG tablet TAKE 1 TABLET BY MOUTH 2 TIMES  DAILY Patient taking differently: TAKE 1 TABLET BY MOUTH EVERY NIGHT. 01/31/15  Yes Thayer Headings, MD  ondansetron (ZOFRAN ODT) 8 MG disintegrating tablet Take 1 tab every 6 hours as needed for nausea. 08/22/15  Yes Amy S Esterwood, PA-C  oxyCODONE (OXY IR/ROXICODONE) 5 MG immediate release tablet Take 1-2 tablets (5-10 mg total) by mouth every 4 (four) hours as needed for moderate pain. 10/31/15  Yes Johnathan Hausen, MD  QUEtiapine (SEROQUEL XR) 300 MG 24 hr tablet Take 300 mg by mouth at bedtime.   Yes Historical Provider, MD  rizatriptan (MAXALT) 10 MG tablet Take 10 mg by mouth daily as needed for migraine. May repeat in 2 hours if needed    Yes Historical Provider, MD  zolpidem (AMBIEN) 10 MG tablet Take 10 mg by mouth at bedtime as needed for sleep.  06/10/13  Yes Historical Provider, MD    Family History Family History  Problem Relation Age of Onset  . Adopted: Yes    Social History Social History  Substance Use Topics  . Smoking status: Never Smoker  . Smokeless tobacco: Never Used  . Alcohol use No     Allergies   Amoxicillin; Ampicillin; Imitrex [sumatriptan]; Other; Prednisone; and Zoloft [sertraline hcl]   Review of Systems Review of Systems  Constitutional: Positive for fever (Tmax 100F).  HENT: Positive for ear discharge and ear pain.   Musculoskeletal: Positive for neck pain. Negative for neck stiffness.  Neurological: Positive for dizziness.  Hematological: Positive for adenopathy.  Ten systems reviewed and are negative for acute change, except as noted in the HPI.    Physical Exam Updated Vital Signs BP 122/56   Pulse 101   Temp 99.1 F (37.3 C) (Oral)   Resp 14   Ht 5\' 8"  (1.727 m)   Wt 104.9 kg   SpO2 92%   BMI 35.16 kg/m   Physical Exam  Constitutional: She is oriented to person, place, and time. She appears well-developed and well-nourished. No distress.  Nontoxic appearing and in NAD  HENT:  Head: Normocephalic and atraumatic.  Right Ear:  There is drainage and tenderness. Decreased hearing is noted.  Left Ear: Hearing, external ear and ear canal normal.  Unable to visualize right TM secondary to purulence in the right ear canal. There is TTP when palpating the right tragus and when pulling on the right auricle. Swelling noted to the external ear structure with mild erythema. No mastoid swelling or TTP bilaterally.  Eyes: Conjunctivae and EOM are normal. No scleral icterus.  Neck: Normal range of motion.  No nuchal rigidity or meningismus  Cardiovascular: Regular rhythm and intact distal pulses.   Borderline tachycardia  Pulmonary/Chest: Effort normal. No respiratory distress.  Respirations even and unlabored  Musculoskeletal: Normal range of motion.  Neurological:  She is alert and oriented to person, place, and time.  GCS 15. Patient moving all extremities.  Skin: Skin is warm and dry. No rash noted. She is not diaphoretic. No erythema. No pallor.  Psychiatric: She has a normal mood and affect. Her behavior is normal.  Nursing note and vitals reviewed.    ED Treatments / Results  Labs (all labs ordered are listed, but only abnormal results are displayed) Labs Reviewed  COMPREHENSIVE METABOLIC PANEL - Abnormal; Notable for the following:       Result Value   Glucose, Bld 113 (*)    BUN <5 (*)    All other components within normal limits  CBC - Abnormal; Notable for the following:    WBC 13.6 (*)    All other components within normal limits  DIFFERENTIAL - Abnormal; Notable for the following:    Neutro Abs 9.8 (*)    All other components within normal limits  LIPASE, BLOOD  I-STAT CG4 LACTIC ACID, ED  I-STAT CG4 LACTIC ACID, ED    EKG  EKG Interpretation None       Radiology Ct Soft Tissue Neck W Contrast  Result Date: 11/07/2015 CLINICAL DATA:  RIGHT ear, neck and facial pain for 3 days with drainage. Recent abdominal surgery. EXAM: CT NECK WITH CONTRAST TECHNIQUE: Multidetector CT imaging of the neck  was performed using the standard protocol following the bolus administration of intravenous contrast. CONTRAST:  75 cc ISOVUE-300 IOPAMIDOL (ISOVUE-300) INJECTION 61% COMPARISON:  Temporal bone CT March 02, 2012 FINDINGS: Pharynx and larynx: Normal. Salivary glands: Fat stranding along the superior aspect of the RIGHT parotid gland, superficial lobe contiguous with RIGHT periauricular soft tissue swelling. Small RIGHT intra parotid lymph nodes without CT findings of acute parotiditis. Major salivary glands are otherwise unremarkable . Thyroid: Normal. Lymph nodes: Sub cm scattered reniform lymph nodes are likely reactive. Vascular: Normal. Limited intracranial: Normal. Visualized orbits: Normal. Mastoids and visualized paranasal sinuses: Paranasal sinuses and LEFT mastoid air cells are well aerated. Small amount of soft tissue RIGHT middle ear and mastoid air cells. Suspected dehiscent RIGHT jugular bulb. Soft tissue within the margin of the RIGHT external auditory canal. Skeleton: Straightened cervical lordosis. Mid cervical degenerative discs. Upper chest: Heterogeneous lung attenuation no superior mediastinal lymphadenopathy. Other: RIGHT periauricular soft tissue swelling, subcutaneous fat stranding and effusion without focal fluid collection. IMPRESSION: RIGHT otitis externus/cellulitis, with RIGHT middle ear and RIGHT mastoid effusion. No drainable fluid collection. Lung apical small airway disease/pulmonary edema. Recommend chest radiograph. Suspected RIGHT jugular bulb dehiscence. Electronically Signed   By: Elon Alas M.D.   On: 11/07/2015 01:42    Procedures Procedures (including critical care time)  Medications Ordered in ED Medications  ciprofloxacin (CIPRO) IVPB 400 mg (not administered)  ondansetron (ZOFRAN-ODT) disintegrating tablet 8 mg (not administered)  sodium chloride 0.9 % bolus 1,000 mL (1,000 mLs Intravenous New Bag/Given 11/07/15 0058)  iopamidol (ISOVUE-300) 61 % injection  (75 mLs  Contrast Given 11/07/15 0112)  ketorolac (TORADOL) injection 60 mg (60 mg Intramuscular Given 11/07/15 0045)  fentaNYL (SUBLIMAZE) injection 25 mcg (25 mcg Nasal Given 11/07/15 0044)     Initial Impression / Assessment and Plan / ED Course  I have reviewed the triage vital signs and the nursing notes.  Pertinent labs & imaging results that were available during my care of the patient were reviewed by me and considered in my medical decision making (see chart for details).   Clinical Course    50 year old female presents to the  emergency department for evaluation of progressing right otalgia. Physical exam concerning for malignant otitis externa. Patient does have evidence of otitis externa on CT scan. No drainable abscess or evidence of osteomyelitis at this time. Patient with temperature of 100F prior to arrival. She was tachycardic to 123 bpm in triage. This has improved with fluids. Plan to admit for observation and additional IV antibiotics. Patient started on ciprofloxacin. Case discussed with Dr. Blaine Hamper of [facility] who will admit.   Final Clinical Impressions(s) / ED Diagnoses   Final diagnoses:  Acute malignant otitis externa of right ear    New Prescriptions New Prescriptions   No medications on file     Antonietta Breach, PA-C 11/07/15 [id number]    Isla Pence, MD 11/07/15 [phone]

## 2015-11-06 NOTE — ED Notes (Signed)
1st stat lactic acid did not cross over, RN aware and results shown. Results were 1.12

## 2015-11-07 ENCOUNTER — Encounter (HOSPITAL_COMMUNITY): Payer: Self-pay | Admitting: Radiology

## 2015-11-07 ENCOUNTER — Emergency Department (HOSPITAL_COMMUNITY): Payer: 59

## 2015-11-07 DIAGNOSIS — Z8659 Personal history of other mental and behavioral disorders: Secondary | ICD-10-CM | POA: Diagnosis not present

## 2015-11-07 DIAGNOSIS — A419 Sepsis, unspecified organism: Secondary | ICD-10-CM | POA: Diagnosis present

## 2015-11-07 DIAGNOSIS — I472 Ventricular tachycardia: Secondary | ICD-10-CM

## 2015-11-07 DIAGNOSIS — H6021 Malignant otitis externa, right ear: Secondary | ICD-10-CM

## 2015-11-07 DIAGNOSIS — F419 Anxiety disorder, unspecified: Secondary | ICD-10-CM

## 2015-11-07 DIAGNOSIS — H6020 Malignant otitis externa, unspecified ear: Secondary | ICD-10-CM | POA: Diagnosis present

## 2015-11-07 DIAGNOSIS — E039 Hypothyroidism, unspecified: Secondary | ICD-10-CM

## 2015-11-07 LAB — BASIC METABOLIC PANEL
ANION GAP: 8 (ref 5–15)
BUN: <5 mg/dL — ABNORMAL LOW (ref 6–20)
CALCIUM: 8.2 mg/dL — AB (ref 8.9–10.3)
CO2: 25 mmol/L (ref 22–32)
Chloride: 105 mmol/L (ref 101–111)
Creatinine, Ser: 0.72 mg/dL (ref 0.44–1.00)
Glucose, Bld: 88 mg/dL (ref 65–99)
Potassium: 3.5 mmol/L (ref 3.5–5.1)
Sodium: 138 mmol/L (ref 135–145)

## 2015-11-07 LAB — CBC WITH DIFFERENTIAL/PLATELET
Basophils Absolute: 0.1 10*3/uL (ref 0.0–0.1)
Basophils Relative: 1 %
EOS PCT: 5 %
Eosinophils Absolute: 0.7 10*3/uL (ref 0.0–0.7)
HEMATOCRIT: 33.5 % — AB (ref 36.0–46.0)
HEMOGLOBIN: 10.7 g/dL — AB (ref 12.0–15.0)
LYMPHS ABS: 2.1 10*3/uL (ref 0.7–4.0)
LYMPHS PCT: 17 %
MCH: 30.1 pg (ref 26.0–34.0)
MCHC: 31.9 g/dL (ref 30.0–36.0)
MCV: 94.1 fL (ref 78.0–100.0)
Monocytes Absolute: 1.1 10*3/uL — ABNORMAL HIGH (ref 0.1–1.0)
Monocytes Relative: 9 %
NEUTROS ABS: 8.3 10*3/uL — AB (ref 1.7–7.7)
NEUTROS PCT: 68 %
PLATELETS: 217 10*3/uL (ref 150–400)
RBC: 3.56 MIL/uL — AB (ref 3.87–5.11)
RDW: 13.6 % (ref 11.5–15.5)
WBC: 12.2 10*3/uL — AB (ref 4.0–10.5)

## 2015-11-07 LAB — DIFFERENTIAL
Basophils Absolute: 0.1 10*3/uL (ref 0.0–0.1)
Basophils Relative: 1 %
EOS ABS: 0.7 10*3/uL (ref 0.0–0.7)
EOS PCT: 5 %
LYMPHS ABS: 1.6 10*3/uL (ref 0.7–4.0)
Lymphocytes Relative: 12 %
MONO ABS: 1 10*3/uL (ref 0.1–1.0)
Monocytes Relative: 7 %
NEUTROS PCT: 75 %
Neutro Abs: 9.8 10*3/uL — ABNORMAL HIGH (ref 1.7–7.7)

## 2015-11-07 LAB — BRAIN NATRIURETIC PEPTIDE: B NATRIURETIC PEPTIDE 5: 58.7 pg/mL (ref 0.0–100.0)

## 2015-11-07 LAB — PROTIME-INR
INR: 1.06
PROTHROMBIN TIME: 13.9 s (ref 11.4–15.2)

## 2015-11-07 LAB — LACTIC ACID, PLASMA
Lactic Acid, Venous: 0.5 mmol/L (ref 0.5–1.9)
Lactic Acid, Venous: 0.7 mmol/L (ref 0.5–1.9)

## 2015-11-07 LAB — APTT: APTT: 26 s (ref 24–36)

## 2015-11-07 LAB — PROCALCITONIN

## 2015-11-07 MED ORDER — OXYCODONE-ACETAMINOPHEN 5-325 MG PO TABS
2.0000 | ORAL_TABLET | Freq: Four times a day (QID) | ORAL | Status: DC | PRN
  Administered 2015-11-07 – 2015-11-08 (×4): 2 via ORAL
  Filled 2015-11-07 (×4): qty 2

## 2015-11-07 MED ORDER — ZOLPIDEM TARTRATE 5 MG PO TABS: 5.0000 mg | ORAL_TABLET | Freq: Every evening | ORAL | Status: DC | PRN

## 2015-11-07 MED ORDER — LEVOTHYROXINE SODIUM 75 MCG PO TABS
75.0000 ug | ORAL_TABLET | Freq: Every day | ORAL | Status: DC
  Administered 2015-11-07 – 2015-11-08 (×2): 75 ug via ORAL
  Filled 2015-11-07 (×3): qty 1

## 2015-11-07 MED ORDER — KETOROLAC TROMETHAMINE 60 MG/2ML IM SOLN
60.0000 mg | Freq: Once | INTRAMUSCULAR | Status: AC
  Administered 2015-11-07: 60 mg via INTRAMUSCULAR
  Filled 2015-11-07: qty 2

## 2015-11-07 MED ORDER — LAMOTRIGINE 25 MG PO TABS
200.0000 mg | ORAL_TABLET | Freq: Every day | ORAL | Status: DC
  Administered 2015-11-07: 200 mg via ORAL
  Filled 2015-11-07: qty 8

## 2015-11-07 MED ORDER — CIPROFLOXACIN IN D5W 400 MG/200ML IV SOLN
400.0000 mg | Freq: Two times a day (BID) | INTRAVENOUS | Status: DC
  Administered 2015-11-07 – 2015-11-08 (×3): 400 mg via INTRAVENOUS
  Filled 2015-11-07 (×3): qty 200

## 2015-11-07 MED ORDER — FENTANYL CITRATE (PF) 100 MCG/2ML IJ SOLN
25.0000 ug | Freq: Once | INTRAMUSCULAR | Status: AC
  Administered 2015-11-07: 25 ug via NASAL
  Filled 2015-11-07: qty 2

## 2015-11-07 MED ORDER — ONDANSETRON 4 MG PO TBDP
8.0000 mg | ORAL_TABLET | Freq: Once | ORAL | Status: AC
Start: 2015-11-07 — End: 2015-11-07
  Administered 2015-11-07: 8 mg via ORAL
  Filled 2015-11-07: qty 2

## 2015-11-07 MED ORDER — ACETAMINOPHEN 650 MG RE SUPP: 650.0000 mg | Freq: Four times a day (QID) | RECTAL | Status: DC | PRN

## 2015-11-07 MED ORDER — MORPHINE SULFATE (PF) 2 MG/ML IV SOLN
2.0000 mg | INTRAVENOUS | Status: AC
  Administered 2015-11-07: 2 mg via INTRAVENOUS

## 2015-11-07 MED ORDER — EST ESTROGENS-METHYLTEST 0.625-1.25 MG PO TABS
2.0000 | ORAL_TABLET | Freq: Every day | ORAL | Status: DC
  Administered 2015-11-07 – 2015-11-08 (×2): 2 via ORAL
  Filled 2015-11-07 (×2): qty 2

## 2015-11-07 MED ORDER — NEOMYCIN-COLIST-HC-THONZONIUM 3.3-3-10-0.5 MG/ML OT SUSP: 4.0000 [drp] | Freq: Four times a day (QID) | OTIC | Status: DC

## 2015-11-07 MED ORDER — SODIUM CHLORIDE 0.9 % IV BOLUS (SEPSIS)
1000.0000 mL | Freq: Once | INTRAVENOUS | Status: AC
  Administered 2015-11-07: 1000 mL via INTRAVENOUS

## 2015-11-07 MED ORDER — NEOMYCIN-POLYMYXIN-HC 3.5-10000-1 OT SUSP
4.0000 [drp] | Freq: Four times a day (QID) | OTIC | Status: DC
  Administered 2015-11-07 – 2015-11-08 (×5): 4 [drp] via OTIC
  Filled 2015-11-07: qty 10

## 2015-11-07 MED ORDER — ALPRAZOLAM 0.5 MG PO TABS: 0.5000 mg | ORAL_TABLET | Freq: Every evening | ORAL | Status: DC | PRN

## 2015-11-07 MED ORDER — SODIUM CHLORIDE 0.9% FLUSH: 3.0000 mL | Freq: Two times a day (BID) | INTRAVENOUS | Status: DC

## 2015-11-07 MED ORDER — POLYETHYLENE GLYCOL 3350 17 G PO PACK
17.0000 g | PACK | Freq: Every day | ORAL | Status: DC
  Administered 2015-11-07 – 2015-11-08 (×2): 17 g via ORAL
  Filled 2015-11-07 (×2): qty 1

## 2015-11-07 MED ORDER — IBUPROFEN 200 MG PO TABS
200.0000 mg | ORAL_TABLET | Freq: Four times a day (QID) | ORAL | Status: DC | PRN
  Administered 2015-11-07 – 2015-11-08 (×2): 200 mg via ORAL
  Filled 2015-11-07 (×2): qty 1

## 2015-11-07 MED ORDER — GABAPENTIN 100 MG PO CAPS
100.0000 mg | ORAL_CAPSULE | Freq: Two times a day (BID) | ORAL | Status: DC
  Administered 2015-11-07 – 2015-11-08 (×3): 100 mg via ORAL
  Filled 2015-11-07 (×3): qty 1

## 2015-11-07 MED ORDER — QUETIAPINE FUMARATE ER 300 MG PO TB24
300.0000 mg | ORAL_TABLET | Freq: Every day | ORAL | Status: DC
  Administered 2015-11-07: 300 mg via ORAL
  Filled 2015-11-07 (×2): qty 1

## 2015-11-07 MED ORDER — SODIUM CHLORIDE 0.9 % IV SOLN
INTRAVENOUS | Status: DC
  Administered 2015-11-07 – 2015-11-08 (×4): via INTRAVENOUS

## 2015-11-07 MED ORDER — DEXAMETHASONE SODIUM PHOSPHATE 4 MG/ML IJ SOLN
2.0000 mg | Freq: Once | INTRAMUSCULAR | Status: AC
  Administered 2015-11-07: 2 mg via INTRAVENOUS
  Filled 2015-11-07: qty 0.5

## 2015-11-07 MED ORDER — MORPHINE SULFATE (PF) 4 MG/ML IV SOLN
2.0000 mg | INTRAVENOUS | Status: DC | PRN
  Administered 2015-11-07 (×2): 2 mg via INTRAVENOUS
  Filled 2015-11-07 (×2): qty 1

## 2015-11-07 MED ORDER — ACETAMINOPHEN 325 MG PO TABS: 650.0000 mg | ORAL_TABLET | Freq: Four times a day (QID) | ORAL | Status: DC | PRN

## 2015-11-07 MED ORDER — ONDANSETRON 4 MG PO TBDP
8.0000 mg | ORAL_TABLET | Freq: Three times a day (TID) | ORAL | Status: DC | PRN
Start: 2015-11-07 — End: 2015-11-08
  Administered 2015-11-08: 8 mg via ORAL
  Filled 2015-11-07: qty 2

## 2015-11-07 MED ORDER — MORPHINE SULFATE (PF) 4 MG/ML IV SOLN
4.0000 mg | Freq: Once | INTRAVENOUS | Status: AC
  Administered 2015-11-07: 4 mg via INTRAVENOUS
  Filled 2015-11-07: qty 1

## 2015-11-07 MED ORDER — EST ESTROGENS-METHYLTEST 1.25-2.5 MG PO TABS: 1.0000 | ORAL_TABLET | Freq: Every day | ORAL | Status: DC

## 2015-11-07 MED ORDER — METOPROLOL TARTRATE 25 MG PO TABS
25.0000 mg | ORAL_TABLET | Freq: Every day | ORAL | Status: DC
  Administered 2015-11-07: 25 mg via ORAL
  Filled 2015-11-07: qty 1

## 2015-11-07 NOTE — Consult Note (Signed)
Cynthia [name], [place] 50 y.o., female [id number]     Chief Complaint: RIGHT ear pain  HPI: 50 yo wf, s/p complicated hospitalization for appendectomy and IV extravasation injury.  3 days ago started with RIGHT ear pain.  Rapidly worse.  Culture and IM Rocephin done in Dr. Inda Merlin office 2 days ago.  No results yet. Severe pain last night and came into ER> Dx Malignant external otitis.  CT shows ear canal swelling, small RIGHT mastoid and middle ear effusion.  Low grade fever.  Sl elevated WBC.  No hx DM, HIV, chemotherapy, prednisone or any sort of immune compromise.    PMH: Past Medical History:  Diagnosis Date  . Allergy   . Anxiety   . Bladder disorder   . Colitis 2006  . Colon polyps   . Complication of anesthesia    problems waking up  . Diastolic congestive heart failure ([place])    with pericarditis- no issues last 5 years 09-2015  . Diverticulosis   . GERD (gastroesophageal reflux disease)   . History of depression   . History of migraines   . Hyperlipemia   . Hypothyroidism   . Kidney stone   . Paroxysmal ventricular tachycardia ([place])   . Pericarditis   . Rapid heart rate    maybe once a month since pericarditis  . Rosacea   . SBO (small bowel obstruction) 08/16/2015  . Vitamin D deficiency     Surg Hx: Past Surgical History:  Procedure Laterality Date  . CARDIAC CATHETERIZATION  11/15/08   SMOOTH AND NORMAL  . CHOLECYSTECTOMY    . COLONOSCOPY    . LAPAROSCOPY    . LAPAROSCOPY N/A 10/26/2015   Procedure: diagnostic LAPAROSCOPY LAPAROTOMY AND APPENDECTOMY;  Surgeon: Johnathan Hausen, MD;  Location: [facility];  Service: General;  Laterality: N/A;  . POLYPECTOMY  02/2009  . SHOULDER SURGERY     left  . TOTAL ABDOMINAL HYSTERECTOMY W/ BILATERAL SALPINGOOPHORECTOMY    . UPPER GASTROINTESTINAL ENDOSCOPY      FHx:   Family History  Problem Relation Age of Onset  . Adopted: Yes   SocHx:  reports that she has never smoked. She has never used smokeless tobacco. She reports that she  does not drink alcohol or use drugs.  ALLERGIES:  Allergies  Allergen Reactions  . Amoxicillin Other (See Comments)    Face, eyes, chest turned red  . Ampicillin     SERUM SICKNESS REACTION  . Imitrex [Sumatriptan] Other (See Comments)    Chest Pain  . Other     Fruits- peaches, cheeries, strawberries, apples, cats   . Prednisone Other (See Comments)    Eye Irritation.  Marland Kitchen Zoloft [Sertraline Hcl] Other (See Comments)    hyperactive    Medications Prior to Admission  Medication Sig Dispense Refill  . ALPRAZolam (XANAX) 1 MG tablet Take 0.5 mg by mouth at bedtime as needed for anxiety.    . Est Estrogens-Methyltest (ESTRATEST PO) Take 1 tablet by mouth daily.     Marland Kitchen gabapentin (NEURONTIN) 100 MG capsule Take 1 capsule (100 mg total) by mouth 2 (two) times daily. 30 capsule 1  . ibuprofen (ADVIL,MOTRIN) 200 MG tablet Take 200 mg by mouth every 6 (six) hours as needed for moderate pain.    Marland Kitchen lamoTRIgine (LAMICTAL) 200 MG tablet Take 200 mg by mouth at bedtime.     Marland Kitchen levothyroxine (SYNTHROID, LEVOTHROID) 75 MCG tablet Take 75 mcg by mouth at bedtime.     . metoprolol tartrate (LOPRESSOR) 25  MG tablet TAKE 1 TABLET BY MOUTH 2 TIMES DAILY (Patient taking differently: TAKE 1 TABLET BY MOUTH EVERY NIGHT.) 60 tablet 0  . ondansetron (ZOFRAN ODT) 8 MG disintegrating tablet Take 1 tab every 6 hours as needed for nausea. 30 tablet 0  . oxyCODONE (OXY IR/ROXICODONE) 5 MG immediate release tablet Take 1-2 tablets (5-10 mg total) by mouth every 4 (four) hours as needed for moderate pain. 30 tablet 0  . QUEtiapine (SEROQUEL XR) 300 MG 24 hr tablet Take 300 mg by mouth at bedtime.    . rizatriptan (MAXALT) 10 MG tablet Take 10 mg by mouth daily as needed for migraine. May repeat in 2 hours if needed     . zolpidem (AMBIEN) 10 MG tablet Take 10 mg by mouth at bedtime as needed for sleep.       Results for orders placed or performed during the hospital encounter of 11/06/15 (from the past 48 hour(s))   Lipase, blood     Status: None   Collection Time: 11/06/15  7:59 PM  Result Value Ref Range   Lipase 22 11 - 51 U/L  Comprehensive metabolic panel     Status: Abnormal   Collection Time: 11/06/15  7:59 PM  Result Value Ref Range   Sodium 138 135 - 145 mmol/L   Potassium 4.0 3.5 - 5.1 mmol/L   Chloride 101 101 - 111 mmol/L   CO2 29 22 - 32 mmol/L   Glucose, Bld 113 (H) 65 - 99 mg/dL   BUN <5 (L) 6 - 20 mg/dL   Creatinine, Ser 0.74 0.44 - 1.00 mg/dL   Calcium 9.5 8.9 - 10.3 mg/dL   Total Protein 7.1 6.5 - 8.1 g/dL   Albumin 3.7 3.5 - 5.0 g/dL   AST 30 15 - 41 U/L   ALT 22 14 - 54 U/L   Alkaline Phosphatase 112 38 - 126 U/L   Total Bilirubin 0.3 0.3 - 1.2 mg/dL   GFR calc non Af Amer >60 >60 mL/min   GFR calc Af Amer >60 >60 mL/min    Comment: (NOTE) The eGFR has been calculated using the CKD EPI equation. This calculation has not been validated in all clinical situations. eGFR's persistently <60 mL/min signify possible Chronic Kidney Disease.    Anion gap 8 5 - 15  CBC     Status: Abnormal   Collection Time: 11/06/15  7:59 PM  Result Value Ref Range   WBC 13.6 (H) 4.0 - 10.5 K/uL   RBC 4.23 3.87 - 5.11 MIL/uL   Hemoglobin 12.9 12.0 - 15.0 g/dL   HCT 39.7 36.0 - 46.0 %   MCV 93.9 78.0 - 100.0 fL   MCH 30.5 26.0 - 34.0 pg   MCHC 32.5 30.0 - 36.0 g/dL   RDW 13.6 11.5 - 15.5 %   Platelets 234 150 - 400 K/uL  Differential     Status: Abnormal   Collection Time: 11/06/15  7:59 PM  Result Value Ref Range   Neutrophils Relative % 75 %   Neutro Abs 9.8 (H) 1.7 - 7.7 K/uL   Lymphocytes Relative 12 %   Lymphs Abs 1.6 0.7 - 4.0 K/uL   Monocytes Relative 7 %   Monocytes Absolute 1.0 0.1 - 1.0 K/uL   Eosinophils Relative 5 %   Eosinophils Absolute 0.7 0.0 - 0.7 K/uL   Basophils Relative 1 %   Basophils Absolute 0.1 0.0 - 0.1 K/uL  I-Stat CG4 Lactic Acid, ED     Status:  None   Collection Time: 11/06/15  8:54 PM  Result Value Ref Range   Lactic Acid, Venous 1.12 0.5 - 1.9  mmol/L  Culture, blood (x 2)     Status: None (Preliminary result)   Collection Time: 11/07/15  4:15 AM  Result Value Ref Range   Specimen Description BLOOD LEFT ARM    Special Requests BOTTLES DRAWN AEROBIC AND ANAEROBIC 5CC EA    Culture PENDING    Report Status PENDING   CBC with Differential     Status: Abnormal   Collection Time: 11/07/15  4:15 AM  Result Value Ref Range   WBC 12.2 (H) 4.0 - 10.5 K/uL   RBC 3.56 (L) 3.87 - 5.11 MIL/uL   Hemoglobin 10.7 (L) 12.0 - 15.0 g/dL   HCT 33.5 (L) 36.0 - 46.0 %   MCV 94.1 78.0 - 100.0 fL   MCH 30.1 26.0 - 34.0 pg   MCHC 31.9 30.0 - 36.0 g/dL   RDW 13.6 11.5 - 15.5 %   Platelets 217 150 - 400 K/uL   Neutrophils Relative % 68 %   Neutro Abs 8.3 (H) 1.7 - 7.7 K/uL   Lymphocytes Relative 17 %   Lymphs Abs 2.1 0.7 - 4.0 K/uL   Monocytes Relative 9 %   Monocytes Absolute 1.1 (H) 0.1 - 1.0 K/uL   Eosinophils Relative 5 %   Eosinophils Absolute 0.7 0.0 - 0.7 K/uL   Basophils Relative 1 %   Basophils Absolute 0.1 0.0 - 0.1 K/uL  Lactic acid, plasma     Status: None   Collection Time: 11/07/15  4:15 AM  Result Value Ref Range   Lactic Acid, Venous 0.5 0.5 - 1.9 mmol/L  Procalcitonin     Status: None   Collection Time: 11/07/15  4:15 AM  Result Value Ref Range   Procalcitonin <0.10 ng/mL    Comment:        Interpretation: PCT (Procalcitonin) <= 0.5 ng/mL: Systemic infection (sepsis) is not likely. Local bacterial infection is possible. (NOTE)         ICU PCT Algorithm               Non ICU PCT Algorithm    ----------------------------     ------------------------------         PCT < 0.25 ng/mL                 PCT < 0.1 ng/mL     Stopping of antibiotics            Stopping of antibiotics       strongly encouraged.               strongly encouraged.    ----------------------------     ------------------------------       PCT level decrease by               PCT < 0.25 ng/mL       >= 80% from peak PCT       OR PCT 0.25 - 0.5 ng/mL           Stopping of antibiotics                                             encouraged.     Stopping of antibiotics           encouraged.    ----------------------------     ------------------------------  PCT level decrease by              PCT >= 0.25 ng/mL       < 80% from peak PCT        AND PCT >= 0.5 ng/mL            Continuin g antibiotics                                              encouraged.       Continuing antibiotics            encouraged.    ----------------------------     ------------------------------     PCT level increase compared          PCT > 0.5 ng/mL         with peak PCT AND          PCT >= 0.5 ng/mL             Escalation of antibiotics                                          strongly encouraged.      Escalation of antibiotics        strongly encouraged.   Protime-INR     Status: None   Collection Time: 11/07/15  4:15 AM  Result Value Ref Range   Prothrombin Time 13.9 11.4 - 15.2 seconds   INR 1.06   APTT     Status: None   Collection Time: 11/07/15  4:15 AM  Result Value Ref Range   aPTT 26 24 - 36 seconds  Basic metabolic panel     Status: Abnormal   Collection Time: 11/07/15  4:15 AM  Result Value Ref Range   Sodium 138 135 - 145 mmol/L   Potassium 3.5 3.5 - 5.1 mmol/L   Chloride 105 101 - 111 mmol/L   CO2 25 22 - 32 mmol/L   Glucose, Bld 88 65 - 99 mg/dL   BUN <5 (L) 6 - 20 mg/dL   Creatinine, Ser 0.72 0.44 - 1.00 mg/dL   Calcium 8.2 (L) 8.9 - 10.3 mg/dL   GFR calc non Af Amer >60 >60 mL/min   GFR calc Af Amer >60 >60 mL/min    Comment: (NOTE) The eGFR has been calculated using the CKD EPI equation. This calculation has not been validated in all clinical situations. eGFR's persistently <60 mL/min signify possible Chronic Kidney Disease.    Anion gap 8 5 - 15  Brain natriuretic peptide     Status: None   Collection Time: 11/07/15  4:15 AM  Result Value Ref Range   B Natriuretic Peptide 58.7 0.0 - 100.0 pg/mL  Lactic acid, plasma      Status: None   Collection Time: 11/07/15  8:26 AM  Result Value Ref Range   Lactic Acid, Venous 0.7 0.5 - 1.9 mmol/L   Ct Soft Tissue Neck W Contrast  Result Date: 11/07/2015 CLINICAL DATA:  RIGHT ear, neck and facial pain for 3 days with drainage. Recent abdominal surgery. EXAM: CT NECK WITH CONTRAST TECHNIQUE: Multidetector CT imaging of the neck was performed using the standard protocol following the bolus administration of intravenous contrast. CONTRAST:  75 cc ISOVUE-300 IOPAMIDOL (ISOVUE-300)  INJECTION 61% COMPARISON:  Temporal bone CT March 02, 2012 FINDINGS: Pharynx and larynx: Normal. Salivary glands: Fat stranding along the superior aspect of the RIGHT parotid gland, superficial lobe contiguous with RIGHT periauricular soft tissue swelling. Small RIGHT intra parotid lymph nodes without CT findings of acute parotiditis. Major salivary glands are otherwise unremarkable . Thyroid: Normal. Lymph nodes: Sub cm scattered reniform lymph nodes are likely reactive. Vascular: Normal. Limited intracranial: Normal. Visualized orbits: Normal. Mastoids and visualized paranasal sinuses: Paranasal sinuses and LEFT mastoid air cells are well aerated. Small amount of soft tissue RIGHT middle ear and mastoid air cells. Suspected dehiscent RIGHT jugular bulb. Soft tissue within the margin of the RIGHT external auditory canal. Skeleton: Straightened cervical lordosis. Mid cervical degenerative discs. Upper chest: Heterogeneous lung attenuation no superior mediastinal lymphadenopathy. Other: RIGHT periauricular soft tissue swelling, subcutaneous fat stranding and effusion without focal fluid collection. IMPRESSION: RIGHT otitis externus/cellulitis, with RIGHT middle ear and RIGHT mastoid effusion. No drainable fluid collection. Lung apical small airway disease/pulmonary edema. Recommend chest radiograph. Suspected RIGHT jugular bulb dehiscence. Electronically Signed   By: Elon Alas M.D.   On: 11/07/2015 01:42     ROS: non contrib  Blood pressure 109/72, pulse 84, temperature 98.2 F (36.8 C), temperature source Oral, resp. rate 20, height 5' 8"  (1.727 m), weight 104.4 kg (230 lb 1.6 oz), SpO2 96 %.  PHYSICAL EXAM: Overall appearance:  Sl overweight. Distressed. Head:  NCAT Ears:LEFT pinna intact.  RIGHT pinna swollen and tender to motion.  No vesicular rash.  Ear wick placed. Nose:  clear Oral Cavity:  clear Oral Pharynx/Hypopharynx/Larynx: not examined Neuro: facial n intact Neck:  clear  Studies Reviewed:CT neck    Assessment/Plan external otitis with typical severe pain.  No evidence of malignant external otitis.  This is typically Pseudomonas, occasionally staph.   Plan:  Retrieve culture from Dr. Inda Merlin' office.  I will add Cortisporin drops which should cover both Pseudomonas and Staph.  I placed an ear wick.   This typically resolves very quickly.  Will benefit from narcotic analgesia for a very few days.  Will plan to remove wick in 2 days.    Jodi Marble 39/0/3009, 1:09 PM

## 2015-11-07 NOTE — H&P (Signed)
History and Physical    [name] [id number] DOB: [birth year] DOA: 11/06/2015  Referring MD/NP/PA:   PCP: Cynthia Screws, MD   Patient coming from:  The patient is coming from home.  At baseline, pt is independent for most of ADL.   Chief Complaint: Right ear pain, fever  HPI: Cynthia [name] is a 50 y.o. female with medical history significant of dCHF, hyperlipidemia, GERD, hypothyroidism, depression, anxiety, pericarditis, migraine headache, colitis, small bowel obstruction, who presents with right ear pain and fever.  Patient states that she has been having right ear pain for 2 days. She has a fever of 100 at home today. She also has right ear fluid draining and decreased hearing. Right ear pain is constant, 8 out of 10 in severity, sharp, radiating to the right face and right facet of the neck. She has nausea, but no vomiting, diarrhea or abdominal pain. Pt was seen by her PCP, who gave her a shot of Rocephin and sent her home on ciprofloxacin. She did not yet start these oral antibiotics. Pt states that her pain is getting worse, she has worsening hearing loss and therefore comes to ED for further event urgent treatment. Of note, she states that she has been followed by an ENT physician in the past for a Pseudomonas infection; patient saw Dr. Thornell Mule in the past.  Patient does not have chest pain, shortness of breath, cough, symptoms of  UTI or unilateral weakness. Patient had a surgery due to small bowel obstruction on 10/26/15. The surgical site is healing well. She does not have abdominal pain today. She has skin tears in the right forearm due to IV fluid infiltration in previous admission.  ED Course: pt was found to have WBC 13.6, lactate 1.12, temperature 99.1, tachycardia, transient tachypnea, electrolytes and renal function okay.  Pt is place on tele bed for obs.  # CT-neck soft tissue showed R otitis externus/cellulitis, with RIGHT middle ear and T mastoid effusion. No  drainable fluid collection. Lung apical small airway disease/pulmonary edema; suspected RIGHT jugular bulb dehiscence.  Review of Systems:   General: hjas fevers, chills, no changes in body weight, has poor appetite, has fatigue HEENT: no blurry vision, has hearing loss and R ear draining, no sore throat Respiratory: no dyspnea, coughing, wheezing CV: no chest pain, no palpitations GI: has nausea, no vomiting, abdominal pain, diarrhea, constipation GU: no dysuria, burning on urination, increased urinary frequency, hematuria  Ext: no leg edema Neuro: no unilateral weakness, numbness, or tingling, no vision change or hearing loss Skin: no rash, Skin tear in left forearm due to IV fluid infiltration from previous admission: MSK: No muscle spasm, no deformity, no limitation of range of movement in spin Heme: No easy bruising.  Travel history: No recent long distant travel.  Allergy:  Allergies  Allergen Reactions  . Amoxicillin Other (See Comments)    Face, eyes, chest turned red  . Ampicillin     SERUM SICKNESS REACTION  . Imitrex [Sumatriptan] Other (See Comments)    Chest Pain  . Other     Fruits- peaches, cheeries, strawberries, apples, cats   . Prednisone Other (See Comments)    Eye Irritation.  [place] [Sertraline Hcl] Other (See Comments)    hyperactive    Past Medical History:  Diagnosis Date  . Allergy   . Anxiety   . Bladder disorder   . Colitis 2006  . Colon polyps   . Complication of anesthesia    problems waking up  .  Diastolic congestive heart failure ([place])    with pericarditis- no issues last 5 years 09-2015  . Diverticulosis   . GERD (gastroesophageal reflux disease)   . History of depression   . History of migraines   . Hyperlipemia   . Hypothyroidism   . Kidney stone   . Paroxysmal ventricular tachycardia ([place])   . Pericarditis   . Rapid heart rate    maybe once a month since pericarditis  . Rosacea   . SBO (small bowel obstruction) 08/16/2015    . Vitamin D deficiency     Past Surgical History:  Procedure Laterality Date  . CARDIAC CATHETERIZATION  11/15/08   SMOOTH AND NORMAL  . CHOLECYSTECTOMY    . COLONOSCOPY    . LAPAROSCOPY    . LAPAROSCOPY N/A 10/26/2015   Procedure: diagnostic LAPAROSCOPY LAPAROTOMY AND APPENDECTOMY;  Surgeon: Johnathan Hausen, MD;  Location: [facility];  Service: General;  Laterality: N/A;  . POLYPECTOMY  02/2009  . SHOULDER SURGERY     left  . TOTAL ABDOMINAL HYSTERECTOMY W/ BILATERAL SALPINGOOPHORECTOMY    . UPPER GASTROINTESTINAL ENDOSCOPY      Social History:  reports that she has never smoked. She has never used smokeless tobacco. She reports that she does not drink alcohol or use drugs.  Family History:  Family History  Problem Relation Age of Onset  . Adopted: Yes     Prior to Admission medications   Medication Sig Start Date End Date Taking? Authorizing Provider  ALPRAZolam Duanne Moron) 1 MG tablet Take 0.5 mg by mouth at bedtime as needed for anxiety.   Yes Historical Provider, MD  Est Estrogens-Methyltest (ESTRATEST PO) Take 1 tablet by mouth daily.    Yes Historical Provider, MD  gabapentin (NEURONTIN) 100 MG capsule Take 1 capsule (100 mg total) by mouth 2 (two) times daily. 10/31/15 11/15/15 Yes Johnathan Hausen, MD  ibuprofen (ADVIL,MOTRIN) 200 MG tablet Take 200 mg by mouth every 6 (six) hours as needed for moderate pain.   Yes Historical Provider, MD  lamoTRIgine (LAMICTAL) 200 MG tablet Take 200 mg by mouth at bedtime.  06/29/13  Yes Historical Provider, MD  levothyroxine (SYNTHROID, LEVOTHROID) 75 MCG tablet Take 75 mcg by mouth at bedtime.    Yes Historical Provider, MD  metoprolol tartrate (LOPRESSOR) 25 MG tablet TAKE 1 TABLET BY MOUTH 2 TIMES DAILY Patient taking differently: TAKE 1 TABLET BY MOUTH EVERY NIGHT. 01/31/15  Yes Thayer Headings, MD  ondansetron (ZOFRAN ODT) 8 MG disintegrating tablet Take 1 tab every 6 hours as needed for nausea. 08/22/15  Yes Amy S Esterwood, PA-C  oxyCODONE  (OXY IR/ROXICODONE) 5 MG immediate release tablet Take 1-2 tablets (5-10 mg total) by mouth every 4 (four) hours as needed for moderate pain. 10/31/15  Yes Johnathan Hausen, MD  QUEtiapine (SEROQUEL XR) 300 MG 24 hr tablet Take 300 mg by mouth at bedtime.   Yes Historical Provider, MD  rizatriptan (MAXALT) 10 MG tablet Take 10 mg by mouth daily as needed for migraine. May repeat in 2 hours if needed    Yes Historical Provider, MD  zolpidem (AMBIEN) 10 MG tablet Take 10 mg by mouth at bedtime as needed for sleep.  06/10/13  Yes Historical Provider, MD    Physical Exam: Vitals:   11/06/15 2230 11/06/15 2315 11/07/15 0200 11/07/15 0230  BP: 122/56 121/72 120/73   Pulse: 101 96 92 95  Resp: 14 14 17 17   Temp:      TempSrc:  SpO2: 92% 95% 91% 97%  Weight:      Height:       General: Not in acute distress HEENT:       Eyes: PERRL, EOMI, no scleral icterus.       ENT: no pharynx injection, no tonsillar enlargement. There is tenderness in right auricle, swelling to the external ear structure with mild erythema. Unable to visualize right TM secondary to purulence in the right ear canal.         Neck: No JVD, no bruit, no mass felt. Heme: No neck lymph node enlargement. Cardiac: S1/S2, RRR, No murmurs, No gallops or rubs. Respiratory:  No rales, wheezing, rhonchi or rubs. GI: Soft, nondistended, nontender, no rebound pain, no organomegaly, BS present. Surgical site is healing well in abdomen GU: No hematuria Ext: No pitting leg edema bilaterally. 2+DP/PT pulse bilaterally. Musculoskeletal: No joint deformities, No joint redness or warmth, no limitation of ROM in spin. Skin: No rashes. Skin tear in left forearm. Neuro: Alert, oriented X3, cranial nerves II-XII grossly intact, moves all extremities normally.  Psych: Patient is not psychotic, no suicidal or hemocidal ideation.  Labs on Admission: I have personally reviewed following labs and imaging studies  CBC:  Recent Labs Lab  11/06/15 1959  WBC 13.6*  NEUTROABS 9.8*  HGB 12.9  HCT 39.7  MCV 93.9  PLT [id number]   Basic Metabolic Panel:  Recent Labs Lab 11/06/15 1959  NA 138  K 4.0  CL 101  CO2 29  GLUCOSE 113*  BUN <5*  CREATININE 0.74  CALCIUM 9.5   GFR: Estimated Creatinine Clearance: 109 mL/min (by C-G formula based on SCr of 0.74 mg/dL). Liver Function Tests:  Recent Labs Lab 11/06/15 1959  AST 30  ALT 22  ALKPHOS 112  BILITOT 0.3  PROT 7.1  ALBUMIN 3.7    Recent Labs Lab 11/06/15 1959  LIPASE 22   No results for input(s): AMMONIA in the last 168 hours. Coagulation Profile: No results for input(s): INR, PROTIME in the last 168 hours. Cardiac Enzymes: No results for input(s): CKTOTAL, CKMB, CKMBINDEX, TROPONINI in the last 168 hours. BNP (last 3 results) No results for input(s): PROBNP in the last 8760 hours. HbA1C: No results for input(s): HGBA1C in the last 72 hours. CBG: No results for input(s): GLUCAP in the last 168 hours. Lipid Profile: No results for input(s): CHOL, HDL, LDLCALC, TRIG, CHOLHDL, LDLDIRECT in the last 72 hours. Thyroid Function Tests: No results for input(s): TSH, T4TOTAL, FREET4, T3FREE, THYROIDAB in the last 72 hours. Anemia Panel: No results for input(s): VITAMINB12, FOLATE, FERRITIN, TIBC, IRON, RETICCTPCT in the last 72 hours. Urine analysis:    Component Value Date/Time   COLORURINE YELLOW 08/16/2015 1814   APPEARANCEUR CLEAR 08/16/2015 1814   LABSPEC 1.024 08/16/2015 1814   PHURINE 6.5 08/16/2015 1814   GLUCOSEU NEGATIVE 08/16/2015 1814   HGBUR NEGATIVE 08/16/2015 1814   BILIRUBINUR NEGATIVE 08/16/2015 1814   KETONESUR NEGATIVE 08/16/2015 1814   PROTEINUR NEGATIVE 08/16/2015 1814   NITRITE NEGATIVE 08/16/2015 1814   LEUKOCYTESUR NEGATIVE 08/16/2015 1814   Sepsis Labs: @LABRCNTIP (procalcitonin:4,lacticidven:4) )No results found for this or any previous visit (from the past 240 hour(s)).   Radiological Exams on Admission: Ct Soft Tissue  Neck W Contrast  Result Date: 11/07/2015 CLINICAL DATA:  RIGHT ear, neck and facial pain for 3 days with drainage. Recent abdominal surgery. EXAM: CT NECK WITH CONTRAST TECHNIQUE: Multidetector CT imaging of the neck was performed using the standard protocol following the bolus administration  of intravenous contrast. CONTRAST:  75 cc ISOVUE-300 IOPAMIDOL (ISOVUE-300) INJECTION 61% COMPARISON:  Temporal bone CT March 02, 2012 FINDINGS: Pharynx and larynx: Normal. Salivary glands: Fat stranding along the superior aspect of the RIGHT parotid gland, superficial lobe contiguous with RIGHT periauricular soft tissue swelling. Small RIGHT intra parotid lymph nodes without CT findings of acute parotiditis. Major salivary glands are otherwise unremarkable . Thyroid: Normal. Lymph nodes: Sub cm scattered reniform lymph nodes are likely reactive. Vascular: Normal. Limited intracranial: Normal. Visualized orbits: Normal. Mastoids and visualized paranasal sinuses: Paranasal sinuses and LEFT mastoid air cells are well aerated. Small amount of soft tissue RIGHT middle ear and mastoid air cells. Suspected dehiscent RIGHT jugular bulb. Soft tissue within the margin of the RIGHT external auditory canal. Skeleton: Straightened cervical lordosis. Mid cervical degenerative discs. Upper chest: Heterogeneous lung attenuation no superior mediastinal lymphadenopathy. Other: RIGHT periauricular soft tissue swelling, subcutaneous fat stranding and effusion without focal fluid collection. IMPRESSION: RIGHT otitis externus/cellulitis, with RIGHT middle ear and RIGHT mastoid effusion. No drainable fluid collection. Lung apical small airway disease/pulmonary edema. Recommend chest radiograph. Suspected RIGHT jugular bulb dehiscence. Electronically Signed   By: Elon Alas M.D.   On: 11/07/2015 01:42     EKG:  Not done in ED, will get one.   Assessment/Plan Principal Problem:   Malignant otitis externa of right ear Active  Problems:   Diastolic congestive heart failure (HCC)   Hypothyroidism   History of depression   History of migraine   Anxiety   Paroxysmal ventricular tachycardia (HCC)   Sepsis (HCC)   Malignant otitis externa   Malignant otitis externa of right ear and sepsis: pt's right ear pain, draining, hearing loss and CT scan findings are consistent malignant otitis externa. Patient meet criteria for sepsis with leukocytosis, fever, tachycardia and tachypnea. Lactate is normal. Hemodynamically stable. Patient's symptom has been worsening, need IV antibiotics treatment.  -will place on tele bed for obs -start IV cipro --will get Procalcitonin and trend lactic acid levels per sepsis protocol. -IVF: 2L of NS bolus in ED, followed by 100 cc/h  -When necessary Percocet and morphine for pain, Zofran for nausea - f/u blood culture x 2 -Please call ENT in AM.  Sepsis - reassessment   Performed at:    3:00 AM   Last Vitals:    Blood pressure 120/64, pulse 91, temperature 99.1 F (37.3 C), temperature source Oral, resp. rate 14, height 5\' 8"  (1.727 m), weight 104.9 kg (231 lb 4 oz), SpO2 96 %.  Heart:                  Regular rhythm Tachchycarida  Lungs:     Clear to auscultation  Capillary Refill:   <2 seconds    Peripheral Pulse (include location): Brachial pulse palpable   Skin (include color):               Normal   Chronic diastolic congestive heart failure ([place]): 2-D echo on 07/14/13 showed EF 55-60%. Patient is not on diuretics. No leg edema or JVD. CHF is compensated on admission. -Continue metoprolol -Check BNP  Hypothyroidism: Last TSH was 0.20 on 05/01/09 -Continue home Synthroid  Depression and anxiety: Stable, no suicidal or homicidal ideations. -Continue home medications: Xanax, Seroquel, Lamictal  Skin tear in left forearm due to IV fluid infiltration from previous admission: -Wound care consult   DVT ppx: SCD Code Status: Full code Family Communication:  Yes,  patient's  husband at bed side Disposition Plan:  Anticipate  discharge back to previous home environment Consults called:  none Admission status: Obs / tele  Date of Service 11/07/2015    Ivor Costa [facility] Pager [phone]  If 7PM-7AM, please contact night-coverage [email or web address] Password [email or web address] 11/07/2015, 2:46 AM

## 2015-11-07 NOTE — Progress Notes (Signed)
Patient arrived to 3E22 from [facility]. A&O X 4. SR on tel. VSS. No complaints of pain. Patient has a midline abdominal wound with steri strips and redden areas on left arm from adhesive tape. Small amount of drainage from right ear. Patient oriented to room and call system.

## 2015-11-07 NOTE — ED Notes (Signed)
Patient transported to CT 

## 2015-11-07 NOTE — Consult Note (Signed)
[place] Nurse wound consult note Reason for Consult: left arm skin tears from iv infiltration pta Wound type: partial thickness Pressure Ulcer POA: No, not pressure injury Measurement:There are 3 larger areas of injury and 7 smaller areas.  Proximal area 3cm x 2cm x 0 cm, mid 1.5cm x 4cm x 0cm, distal 3cm x 2cm x 0 cm the other 7 smaller ones each 1cm x 1.5cm x 0cm Wound bed: 100% pink Drainage (amount, consistency, odor) none Periwound:intact Dressing procedure/placement/frequency: I have provided nurses with orders for To left arm, Cleanse with NS, gently pat dry, apply Xeroform gauze, cover with vaseline gauze (per pt request because seems to dry too fast), change every shift. We will not follow, but will remain available to this patient, to nursing, and the medical and/or surgical teams.  Please re-consult if we need to assist further.    Fara Olden, RN-C, WTA-C Wound Treatment Associate

## 2015-11-07 NOTE — Progress Notes (Signed)
[facility] Interval progress note  Subjective: Seen and examined this morning at bedside. Patient continues to complain of R ear pain and hearing has not improved yet. No other concerns at this time.  Vital signs stable Ears: Right pinna swollen and tender motion, right ear canal edematous, unable to visualize tympanic membrane. No drainage noticed.  A&P: Acute otitis externa -  unlikely to be malignant, possible Pseudomonas or staff - ENT consult appreciated, ear wick placed  - Decadron 2 mg 1 dose given  - Continue IV antibiotics  - Cortisporin added per ENT  - Continue to monitor   Chipper Oman, MD  Hospitalist  P: [phone]

## 2015-11-07 NOTE — ED Notes (Signed)
Attempted to call report

## 2015-11-08 DIAGNOSIS — H60391 Other infective otitis externa, right ear: Secondary | ICD-10-CM | POA: Diagnosis not present

## 2015-11-08 LAB — CBC
HCT: 35.9 % — ABNORMAL LOW (ref 36.0–46.0)
HEMOGLOBIN: 11.6 g/dL — AB (ref 12.0–15.0)
MCH: 30.1 pg (ref 26.0–34.0)
MCHC: 32.3 g/dL (ref 30.0–36.0)
MCV: 93 fL (ref 78.0–100.0)
PLATELETS: 250 10*3/uL (ref 150–400)
RBC: 3.86 MIL/uL — AB (ref 3.87–5.11)
RDW: 13.3 % (ref 11.5–15.5)
WBC: 11.4 10*3/uL — AB (ref 4.0–10.5)

## 2015-11-08 MED ORDER — NEOMYCIN-POLYMYXIN-HC 3.5-10000-1 OT SOLN: 4.0000 [drp] | Freq: Four times a day (QID) | OTIC | 0 refills | Status: DC

## 2015-11-08 MED ORDER — KETOROLAC TROMETHAMINE 15 MG/ML IJ SOLN
15.0000 mg | Freq: Four times a day (QID) | INTRAMUSCULAR | Status: DC | PRN
  Administered 2015-11-08: 15 mg via INTRAVENOUS
  Filled 2015-11-08 (×2): qty 1

## 2015-11-08 MED ORDER — IBUPROFEN 800 MG PO TABS: 800.0000 mg | ORAL_TABLET | Freq: Three times a day (TID) | ORAL | 0 refills | Status: DC | PRN

## 2015-11-08 MED ORDER — CIPROFLOXACIN HCL 250 MG PO TABS: 250.0000 mg | ORAL_TABLET | Freq: Two times a day (BID) | ORAL | 0 refills | Status: DC

## 2015-11-08 NOTE — Progress Notes (Signed)
11/08/2015 9:47 AM  Cynthia [name] [id number]  Hosp day 2   Temp:  [97.7 F (36.5 C)-98.2 F (36.8 C)] 97.7 F (36.5 C) (10/05 0549) Pulse Rate:  [73-84] 73 (10/05 0549) Resp:  [20] 20 (10/05 0549) BP: (109-125)/(69-72) 122/72 (10/05 0549) SpO2:  [93 %-96 %] 93 % (10/05 0549) Weight:  [104.1 kg (229 lb 8 oz)] 104.1 kg (229 lb 8 oz) (10/05 0400),     Intake/Output Summary (Last 24 hours) at 11/08/15 0947 Last data filed at 11/08/15 0500  Gross per 24 hour  Intake          3411.67 ml  Output             1800 ml  Net          1611.67 ml    Results for orders placed or performed during the hospital encounter of 11/06/15 (from the past 24 hour(s))  LAB REPORT - SCANNED     Status: None   Collection Time: 11/07/15  1:14 PM   Narrative   Ordered by an unspecified provider.  CBC     Status: Abnormal   Collection Time: 11/08/15  8:24 AM  Result Value Ref Range   WBC 11.4 (H) 4.0 - 10.5 K/uL   RBC 3.86 (L) 3.87 - 5.11 MIL/uL   Hemoglobin 11.6 (L) 12.0 - 15.0 g/dL   HCT 35.9 (L) 36.0 - 46.0 %   MCV 93.0 78.0 - 100.0 fL   MCH 30.1 26.0 - 34.0 pg   MCHC 32.3 30.0 - 36.0 g/dL   RDW 13.3 11.5 - 15.5 %   Platelets 250 150 - 400 K/uL    SUBJECTIVE:  Sl less pain.  Lost balance and fell last PM.  No syncope.  OBJECTIVE:  Ear not swollen.  Facial n intact  IMPRESSION:  Satisfactory check  PLAN:  Remove wick in AM.  Follow up on cultures from Dr. Inda Merlin, 3 OCT.  Could see her in my office tomorrow if she is discharged.  Could switch to po Cipro depending on Dr. Inda Merlin' culture results.  Jodi Marble

## 2015-11-08 NOTE — Progress Notes (Signed)
Pt has orders to be discharged. Discharge instructions given and pt has no additional questions at this time. Medication regimen reviewed and pt educated. Pt verbalized understanding and has no additional questions. Telemetry box removed. IV removed and site in good condition. Pt stable and waiting for transportation. 

## 2015-11-08 NOTE — Telephone Encounter (Signed)
DONE

## 2015-11-08 NOTE — Discharge Summary (Signed)
Physician Discharge Summary  Cynthia [name]  [id number]  DOB: [birth year]  DOA: 11/06/2015 PCP: Henrine Screws, MD  Admit date: 11/06/2015 Discharge date: 11/08/2015  Admitted From: Home Disposition:  Home  Recommendations for Outpatient Follow-up:  1. Follow up with PCP in 1-2 weeks 2. Please obtain BMP/CBC in one week 3. Please follow up on the following pending results: Cultures from PMD 4. Follow up with ENT for ear wick removal    Discharge Condition: Stable CODE STATUS: Full Diet recommendation: Heart Healthy   Brief/Interim Summary: Cynthia [name] is a 50 y.o. female with medical history significant of dCHF, hyperlipidemia, GERD, hypothyroidism, depression, anxiety, pericarditis, migraine headache, colitis, small bowel obstruction, who presents with right ear pain and fever. Placed on obs for treatment of severely painful otitis externa. Patient was placed on IV antibiotic and pain medication as needed. Severe ear canal swelling was noted, decadron was given. ENT was consulted whom placed a ear wick and recommended topical antibiotics w steroids. Patient had a CT-neck soft tissue showed R otitis externus/cellulitis, with RIGHT middle ear and T mastoid effusion. After IV abx and pain was controlled patient significantly improved. Cipro was switched to PO to continue a total of 10 days, Patient will be discharge home to follow up with PMD for cultures results and further treatment  Discharge Diagnoses:   Acute otitis externa -  unlikely to be malignant, possible Pseudomonas or staff - ENT consult appreciated, ear wick placed to be removed tomorrow at Dr [facility] office - Continue antibiotics for total of 10 days - Cortisporin for 7 days - Pain medication as needed  Discharge Instructions  Discharge Instructions    Call MD for:  difficulty breathing, headache or visual disturbances    Complete by:  As directed    Call MD for:  extreme fatigue    Complete by:  As directed     Call MD for:  hives    Complete by:  As directed    Call MD for:  persistant dizziness or light-headedness    Complete by:  As directed    Call MD for:  persistant nausea and vomiting    Complete by:  As directed    Call MD for:  redness, tenderness, or signs of infection (pain, swelling, redness, odor or green/yellow discharge around incision site)    Complete by:  As directed    Call MD for:  severe uncontrolled pain    Complete by:  As directed    Call MD for:  temperature >100.4    Complete by:  As directed    Diet - low sodium heart healthy    Complete by:  As directed    Discharge instructions    Complete by:  As directed    Follow up with ENT tomorrow for ear wick removal   Increase activity slowly    Complete by:  As directed        Medication List    TAKE these medications   ALPRAZolam 1 MG tablet Commonly known as:  XANAX Take 0.5 mg by mouth at bedtime as needed for anxiety.   ciprofloxacin 250 MG tablet Commonly known as:  CIPRO Take 1 tablet (250 mg total) by mouth 2 (two) times daily.   ESTRATEST PO Take 1 tablet by mouth daily.   gabapentin 100 MG capsule Commonly known as:  NEURONTIN Take 1 capsule (100 mg total) by mouth 2 (two) times daily.   ibuprofen 800 MG tablet Commonly known as:  ADVIL,MOTRIN Take 1 tablet (800 mg total) by mouth every 8 (eight) hours as needed. What changed:  medication strength  how much to take  when to take this  reasons to take this   lamoTRIgine 200 MG tablet Commonly known as:  LAMICTAL Take 200 mg by mouth at bedtime.   levothyroxine 75 MCG tablet Commonly known as:  SYNTHROID, LEVOTHROID Take 75 mcg by mouth at bedtime.   metoprolol tartrate 25 MG tablet Commonly known as:  LOPRESSOR TAKE 1 TABLET BY MOUTH 2 TIMES DAILY What changed:  See the new instructions.   neomycin-polymyxin-hydrocortisone otic solution Commonly known as:  CORTISPORIN Place 4 drops into the right ear 4 (four) times daily.    ondansetron 8 MG disintegrating tablet Commonly known as:  ZOFRAN ODT Take 1 tab every 6 hours as needed for nausea.   oxyCODONE 5 MG immediate release tablet Commonly known as:  Oxy IR/ROXICODONE Take 1-2 tablets (5-10 mg total) by mouth every 4 (four) hours as needed for moderate pain.   QUEtiapine 300 MG 24 hr tablet Commonly known as:  SEROQUEL XR Take 300 mg by mouth at bedtime.   rizatriptan 10 MG tablet Commonly known as:  MAXALT Take 10 mg by mouth daily as needed for migraine. May repeat in 2 hours if needed   zolpidem 10 MG tablet Commonly known as:  AMBIEN Take 10 mg by mouth at bedtime as needed for sleep.       Allergies  Allergen Reactions  . Amoxicillin Other (See Comments)    Face, eyes, chest turned red  . Ampicillin     SERUM SICKNESS REACTION  . Imitrex [Sumatriptan] Other (See Comments)    Chest Pain  . Other     Fruits- peaches, cheeries, strawberries, apples, cats   . Prednisone Other (See Comments)    Eye Irritation.  Marland Kitchen Zoloft [Sertraline Hcl] Other (See Comments)    hyperactive    Consultations:  ENT Dr. Erik Obey   Procedures/Studies: Ct Soft Tissue Neck W Contrast  Result Date: 11/07/2015 CLINICAL DATA:  RIGHT ear, neck and facial pain for 3 days with drainage. Recent abdominal surgery. EXAM: CT NECK WITH CONTRAST TECHNIQUE: Multidetector CT imaging of the neck was performed using the standard protocol following the bolus administration of intravenous contrast. CONTRAST:  75 cc ISOVUE-300 IOPAMIDOL (ISOVUE-300) INJECTION 61% COMPARISON:  Temporal bone CT March 02, 2012 FINDINGS: Pharynx and larynx: Normal. Salivary glands: Fat stranding along the superior aspect of the RIGHT parotid gland, superficial lobe contiguous with RIGHT periauricular soft tissue swelling. Small RIGHT intra parotid lymph nodes without CT findings of acute parotiditis. Major salivary glands are otherwise unremarkable . Thyroid: Normal. Lymph nodes: Sub cm scattered  reniform lymph nodes are likely reactive. Vascular: Normal. Limited intracranial: Normal. Visualized orbits: Normal. Mastoids and visualized paranasal sinuses: Paranasal sinuses and LEFT mastoid air cells are well aerated. Small amount of soft tissue RIGHT middle ear and mastoid air cells. Suspected dehiscent RIGHT jugular bulb. Soft tissue within the margin of the RIGHT external auditory canal. Skeleton: Straightened cervical lordosis. Mid cervical degenerative discs. Upper chest: Heterogeneous lung attenuation no superior mediastinal lymphadenopathy. Other: RIGHT periauricular soft tissue swelling, subcutaneous fat stranding and effusion without focal fluid collection. IMPRESSION: RIGHT otitis externus/cellulitis, with RIGHT middle ear and RIGHT mastoid effusion. No drainable fluid collection. Lung apical small airway disease/pulmonary edema. Recommend chest radiograph. Suspected RIGHT jugular bulb dehiscence. Electronically Signed   By: Elon Alas M.D.   On: 11/07/2015 01:42  Discharge Exam: Vitals:   11/07/15 1958 11/08/15 0549  BP: 125/69 122/72  Pulse: 82 73  Resp: 20 20  Temp: 97.8 F (36.6 C) 97.7 F (36.5 C)   Vitals:   11/07/15 1207 11/07/15 1958 11/08/15 0400 11/08/15 0549  BP: 109/72 125/69  122/72  Pulse: 84 82  73  Resp: 20 20  20   Temp: 98.2 F (36.8 C) 97.8 F (36.6 C)  97.7 F (36.5 C)  TempSrc: Oral Oral  Oral  SpO2: 96% 95%  93%  Weight:   104.1 kg (229 lb 8 oz)   Height:        General: Pt is alert, awake, not in acute distress Ear not swollen.  Facial n intact Cardiovascular: RRR, S1/S2 +, no rubs, no gallops Respiratory: CTA bilaterally, no wheezing, no rhonchi Abdominal: Soft, NT, ND, bowel sounds + Extremities: no edema, no cyanosis   The results of significant diagnostics from this hospitalization (including imaging, microbiology, ancillary and laboratory) are listed below for reference.     Microbiology: Recent Results (from the past 240  hour(s))  Culture, blood (x 2)     Status: None (Preliminary result)   Collection Time: 11/07/15  4:15 AM  Result Value Ref Range Status   Specimen Description BLOOD LEFT ARM  Final   Special Requests BOTTLES DRAWN AEROBIC AND ANAEROBIC 5CC EA  Final   Culture NO GROWTH 1 DAY  Final   Report Status PENDING  Incomplete  Culture, blood (x 2)     Status: None (Preliminary result)   Collection Time: 11/07/15  4:18 AM  Result Value Ref Range Status   Specimen Description BLOOD BLOOD LEFT ARM  Final   Special Requests BOTTLES DRAWN AEROBIC ONLY 5CC  Final   Culture NO GROWTH 1 DAY  Final   Report Status PENDING  Incomplete     Labs: BNP (last 3 results)  Recent Labs  11/07/15 0415  BNP [id number]   Basic Metabolic Panel:  Recent Labs Lab 11/06/15 1959 11/07/15 0415  NA 138 138  K 4.0 3.5  CL 101 105  CO2 29 25  GLUCOSE 113* 88  BUN <5* <5*  CREATININE 0.74 0.72  CALCIUM 9.5 8.2*   Liver Function Tests:  Recent Labs Lab 11/06/15 1959  AST 30  ALT 22  ALKPHOS 112  BILITOT 0.3  PROT 7.1  ALBUMIN 3.7    Recent Labs Lab 11/06/15 1959  LIPASE 22   No results for input(s): AMMONIA in the last 168 hours. CBC:  Recent Labs Lab 11/06/15 1959 11/07/15 0415 11/08/15 0824  WBC 13.6* 12.2* 11.4*  NEUTROABS 9.8* 8.3*  --   HGB 12.9 10.7* 11.6*  HCT 39.7 33.5* 35.9*  MCV 93.9 94.1 93.0  PLT 234 217 250   Cardiac Enzymes: No results for input(s): CKTOTAL, CKMB, CKMBINDEX, TROPONINI in the last 168 hours. BNP: Invalid input(s): POCBNP CBG: No results for input(s): GLUCAP in the last 168 hours. D-Dimer No results for input(s): DDIMER in the last 72 hours. Hgb A1c No results for input(s): HGBA1C in the last 72 hours. Lipid Profile No results for input(s): CHOL, HDL, LDLCALC, TRIG, CHOLHDL, LDLDIRECT in the last 72 hours. Thyroid function studies No results for input(s): TSH, T4TOTAL, T3FREE, THYROIDAB in the last 72 hours.  Invalid input(s): FREET3 Anemia work  up No results for input(s): VITAMINB12, FOLATE, FERRITIN, TIBC, IRON, RETICCTPCT in the last 72 hours. Urinalysis    Component Value Date/Time   COLORURINE YELLOW 08/16/2015 1814   APPEARANCEUR  CLEAR 08/16/2015 1814   LABSPEC 1.024 08/16/2015 1814   PHURINE 6.5 08/16/2015 1814   GLUCOSEU NEGATIVE 08/16/2015 1814   HGBUR NEGATIVE 08/16/2015 1814   BILIRUBINUR NEGATIVE 08/16/2015 1814   KETONESUR NEGATIVE 08/16/2015 1814   PROTEINUR NEGATIVE 08/16/2015 1814   NITRITE NEGATIVE 08/16/2015 1814   LEUKOCYTESUR NEGATIVE 08/16/2015 1814   Sepsis Labs Invalid input(s): PROCALCITONIN,  WBC,  LACTICIDVEN Microbiology Recent Results (from the past 240 hour(s))  Culture, blood (x 2)     Status: None (Preliminary result)   Collection Time: 11/07/15  4:15 AM  Result Value Ref Range Status   Specimen Description BLOOD LEFT ARM  Final   Special Requests BOTTLES DRAWN AEROBIC AND ANAEROBIC 5CC EA  Final   Culture NO GROWTH 1 DAY  Final   Report Status PENDING  Incomplete  Culture, blood (x 2)     Status: None (Preliminary result)   Collection Time: 11/07/15  4:18 AM  Result Value Ref Range Status   Specimen Description BLOOD BLOOD LEFT ARM  Final   Special Requests BOTTLES DRAWN AEROBIC ONLY 5CC  Final   Culture NO GROWTH 1 DAY  Final   Report Status PENDING  Incomplete    SIGNED:  Chipper Oman, MD  [facility] 11/08/2015, 3:13 PM Pager   If 7PM-7AM, please contact night-coverage [email or web address] Password [email or web address]

## 2015-11-09 DIAGNOSIS — H60311 Diffuse otitis externa, right ear: Secondary | ICD-10-CM | POA: Insufficient documentation

## 2015-11-12 LAB — CULTURE, BLOOD (ROUTINE X 2)
CULTURE: NO GROWTH
Culture: NO GROWTH

## 2015-11-22 ENCOUNTER — Other Ambulatory Visit: Payer: Self-pay | Admitting: Internal Medicine

## 2015-11-22 DIAGNOSIS — Z1231 Encounter for screening mammogram for malignant neoplasm of breast: Secondary | ICD-10-CM

## 2015-12-06 DIAGNOSIS — H6240 Otitis externa in other diseases classified elsewhere, unspecified ear: Secondary | ICD-10-CM | POA: Insufficient documentation

## 2016-01-24 ENCOUNTER — Ambulatory Visit
Admission: RE | Admit: 2016-01-24 | Discharge: 2016-01-24 | Disposition: A | Discharge status: Home / Self Care | Payer: 59 | Source: Ambulatory Visit | Attending: Internal Medicine | Admitting: Internal Medicine

## 2016-01-24 DIAGNOSIS — Z1231 Encounter for screening mammogram for malignant neoplasm of breast: Secondary | ICD-10-CM

## 2016-02-09 ENCOUNTER — Other Ambulatory Visit: Payer: Self-pay | Admitting: Gastroenterology

## 2016-09-23 ENCOUNTER — Other Ambulatory Visit: Payer: Self-pay | Admitting: Surgery

## 2016-09-23 ENCOUNTER — Ambulatory Visit
Admission: RE | Admit: 2016-09-23 | Discharge: 2016-09-23 | Disposition: A | Discharge status: Home / Self Care | Payer: 59 | Source: Ambulatory Visit | Attending: Surgery | Admitting: Surgery

## 2016-09-23 DIAGNOSIS — R140 Abdominal distension (gaseous): Secondary | ICD-10-CM

## 2016-09-23 MED ORDER — IOPAMIDOL (ISOVUE-300) INJECTION 61%
125.0000 mL | Freq: Once | INTRAVENOUS | Status: AC | PRN
  Administered 2016-09-23: 125 mL via INTRAVENOUS

## 2016-10-09 ENCOUNTER — Encounter: Payer: Self-pay | Admitting: Nurse Practitioner

## 2016-10-09 ENCOUNTER — Ambulatory Visit (INDEPENDENT_AMBULATORY_CARE_PROVIDER_SITE_OTHER): Payer: 59 | Admitting: Nurse Practitioner

## 2016-10-09 VITALS — BP 110/74 | HR 72 | Ht 67.5 in | Wt 227.2 lb

## 2016-10-09 DIAGNOSIS — R1084 Generalized abdominal pain: Secondary | ICD-10-CM

## 2016-10-09 DIAGNOSIS — R933 Abnormal findings on diagnostic imaging of other parts of digestive tract: Secondary | ICD-10-CM

## 2016-10-09 NOTE — Patient Instructions (Signed)
Follow up as needed

## 2016-10-09 NOTE — Progress Notes (Addendum)
HPI:  Patient is a 51 year old female known to Dr. Henrene Pastor. She has a history of colon polyps including most recent colonoscopy one year ago.   Patient is referred by Dr. Johnathan Hausen for abnormal CT scan suggesting proctitis. [place]ftly, patient was hospitalized for 3 days July 2017 for nausea, vomiting, and abdominal pain. CT scan showed mild gaseous distention of the stomach with fluid and gas without evidence for GOO. There was also mild diffuse distention of the small bowel with air fluid levels. No transition point was seen. She was felt to have enteritis versus ileus versus early partial SBO. She improved with NG decompression but developed recurrent pain a few weeks later. She was seen by Dr. Hassell Done at [place]. Dr. Hassell Done planned for laparoscopy possible laparotomy for unexplained right upper quadrant pain. He requested a preop colonoscopy which patient had done Dr. Henrene Pastor August 2017 with findings of only a few small adenomatous colon polyps without high-grade dysplasia. Late September she had a laparotomy and appendectomy. She was found to have a fairly mobile transverse colon, ascending colon and the cecum was able to follow-up over toward the midline.     Patient has been having recurrent abdominal pain,the same pain as prior to the laparotomy and she has been in touch with Dr. Hassell Done who ordered CT scan which showed mild rectal wall thickening and perirectal soft tissue stranding. Dr. Hassell Done requested patient to follow-up with [name].   Patient normally has what she considers normal bowel habits with a bowel movement about 3 times a week. For unclear reasons she did get constipated several days prior to the last CT scan. She did take a lot of MiraLAX for the constipation prior to the scan. MiraLAX gave her good results and she is back to baseline. The only other unusual thing she can recall is that several months ago she rectal pain and bleeding with bowel movements over a period of about 3  months.. No bleeding since April.    Past Medical History:  Diagnosis Date  . Allergy   . Anxiety   . Bladder disorder   . Colitis 2006  . Colon polyps   . Complication of anesthesia    problems waking up  . Diastolic congestive heart failure ([place])    with pericarditis- no issues last 5 years 09-2015  . Diverticulosis   . GERD (gastroesophageal reflux disease)   . History of depression   . History of migraines   . Hyperlipemia   . Hypothyroidism   . Kidney stone   . Paroxysmal ventricular tachycardia ([place])   . Pericarditis   . Rapid heart rate    maybe once a month since pericarditis  . Rosacea   . SBO (small bowel obstruction) ([place]) 08/16/2015  . Vitamin D deficiency     Patient's surgical history, family medical history, social history, medications and allergies were all reviewed in Epic    Physical Exam: BP 110/74   Pulse 72   Ht 5' 7.5" (1.715 m)   Wt 227 lb 4 oz (103.1 kg)   BMI 35.07 kg/m   GENERAL: white female in NAD PSYCH: :Pleasant, cooperative, normal affect EENT:  conjunctiva pink, mucous membranes moist, neck supple without masses CARDIAC:  RRR, no murmur heard, no peripheral edema PULM: Normal respiratory effort, lungs CTA bilaterally, no wheezing ABDOMEN:  soft, nontender, nondistended, no obvious masses, no hepatomegaly,  normal bowel sounds SKIN:  turgor, no lesions seen Musculoskeletal:  Normal tone and normal strength NEURO: Alert  and oriented x 3, no focal neurologic deficits   ASSESSMENT and PLAN:  1. Pleasant 51 yo with incidental finding of proctitis on recent CTscan. She had some bleeding and perianal pain a few months ago but both resolved and no problems since April. I suspect she had an anal fissure at the time.  Regardless, she had a colonoscopy just the week prior to the CTscan. A few polyps were removed but none from the rectum.  I will forward scan to Dr. Henrene Pastor to see if he has any concerns.  2. Persistent abdominal pain. There is  concern about her floppy cecum and also she is developing a ventral hernia. She is to follow up with Dr. Hassell Done.    I spent  25 minutes of face-to-face time with the patient. Greater than 50% of the time was spent counseling and coordinating care.   Addendum : 10/16/16. Realized the colonoscopy was 10/03/15 and CTscan was 09/23/16 so basically a year apart. Given this patient will be scheduled for repeat colonoscopy to evaluate CT scan findings.   Tye Savoy , NP 10/09/2016, 9:22 AM

## 2016-10-13 NOTE — [place] Notes (Signed)
Assessment, imaging, prior colonoscopy reports reviewed. Cynthia [name], Probably best to repeat colonoscopy at this time to be certain that CT abnormalities are not clinically significant. Please arrange. Thanks.  jp

## 2016-12-08 ENCOUNTER — Ambulatory Visit (AMBULATORY_SURGERY_CENTER): Payer: Self-pay | Admitting: *Deleted

## 2016-12-08 VITALS — Ht 68.0 in | Wt 230.0 lb

## 2016-12-08 DIAGNOSIS — R933 Abnormal findings on diagnostic imaging of other parts of digestive tract: Secondary | ICD-10-CM

## 2016-12-08 DIAGNOSIS — Z86010 Personal history of colonic polyps: Secondary | ICD-10-CM

## 2016-12-08 MED ORDER — NA SULFATE-K SULFATE-MG SULF 17.5-3.13-1.6 GM/177ML PO SOLN: 1.0000 [IU] | Freq: Once | ORAL | 0 refills | Status: AC

## 2016-12-08 NOTE — Progress Notes (Signed)
No egg or soy allergy known to patient  No issues with past sedation with any surgeries  or procedures, no intubation problems  No diet pills per patient No home 02 use per patient  No blood thinners per patient  Pt denies issues with constipation  No A fib or A flutter  EMMI video sent to pt's e mail pt declined   

## 2016-12-10 ENCOUNTER — Encounter: Payer: Self-pay | Admitting: Internal Medicine

## 2016-12-16 ENCOUNTER — Other Ambulatory Visit: Payer: Self-pay | Admitting: Internal Medicine

## 2016-12-16 DIAGNOSIS — Z1231 Encounter for screening mammogram for malignant neoplasm of breast: Secondary | ICD-10-CM

## 2016-12-18 ENCOUNTER — Encounter: Payer: Self-pay | Admitting: Internal Medicine

## 2016-12-18 ENCOUNTER — Other Ambulatory Visit: Payer: Self-pay

## 2016-12-18 ENCOUNTER — Ambulatory Visit (AMBULATORY_SURGERY_CENTER): Payer: 59 | Admitting: Internal Medicine

## 2016-12-18 VITALS — BP 103/70 | HR 74 | Temp 98.6°F | Resp 14 | Ht 68.0 in | Wt 230.0 lb

## 2016-12-18 DIAGNOSIS — Z860100 Personal history of colon polyps, unspecified: Secondary | ICD-10-CM

## 2016-12-18 DIAGNOSIS — R935 Abnormal findings on diagnostic imaging of other abdominal regions, including retroperitoneum: Secondary | ICD-10-CM

## 2016-12-18 DIAGNOSIS — D125 Benign neoplasm of sigmoid colon: Secondary | ICD-10-CM | POA: Diagnosis not present

## 2016-12-18 DIAGNOSIS — K635 Polyp of colon: Secondary | ICD-10-CM

## 2016-12-18 MED ORDER — SODIUM CHLORIDE 0.9 % IV SOLN: 500.0000 mL | INTRAVENOUS | Status: DC

## 2016-12-18 NOTE — Op Note (Signed)
[name] Procedure Date: 12/18/2016 3:01 PM MRN: [id number] Endoscopist: Docia Chuck. Henrene Pastor , MD Age: 51 Referring MD:  Date of Birth: Sep 01, 1965 Gender: Female Account #: [id number] Procedure:                Colonoscopy, with snare polypectomy x 1 Indications:              Abnormal CT of the GI tract. 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 Previous                            examinations 2011 (elsewhere), 2013, and 2017 Medicines:                Monitored Anesthesia Care Procedure:                Pre-Anesthesia Assessment:                           - Prior to the procedure, a History and Physical                            was performed, and patient medications and                            allergies were reviewed. The patient's tolerance of                            previous anesthesia was also reviewed. The risks                            and benefits of the procedure and the sedation                            options and risks were discussed with the patient.                            All questions were answered, and informed consent                            was obtained. Prior Anticoagulants: The patient has                            taken no previous anticoagulant or antiplatelet                            agents. ASA Grade Assessment: II - A patient with                            mild systemic disease. After reviewing the risks                            and benefits, the patient was deemed in  satisfactory condition to undergo the procedure.                           After obtaining informed consent, the colonoscope                            was passed under direct vision. Throughout the                            procedure, the patient's blood pressure, pulse, and                            oxygen saturations were monitored continuously. The              Colonoscope was introduced through the anus and                            advanced to the the cecum, identified by                            appendiceal orifice and ileocecal valve. The                            ileocecal valve, appendiceal orifice, and rectum                            were photographed. The quality of the bowel                            preparation was excellent. The colonoscopy was                            performed without difficulty. The patient tolerated                            the procedure well. The bowel preparation used was                            SUPREP. Scope In: 3:09:14 PM Scope Out: 3:27:20 PM Scope Withdrawal Time: 0 hours 14 minutes 33 seconds  Total Procedure Duration: 0 hours 18 minutes 6 seconds  Findings:                 A 2 mm polyp was found in the sigmoid colon. The                            polyp was removed with a cold snare. Resection and                            retrieval were complete.                           Non-bleeding internal hemorrhoids were found during  retroflexion. The hemorrhoids were small.                           The exam was otherwise without abnormality on                            direct and retroflexion views. Complications:            No immediate complications. Estimated blood loss:                            None. Estimated Blood Loss:     Estimated blood loss: none. Impression:               - One 2 mm polyp in the sigmoid colon, removed with                            a cold snare. Resected and retrieved.                           - Non-bleeding internal hemorrhoids.                           - The examination was otherwise normal on direct                            and retroflexion views. Recommendation:           - Repeat colonoscopy in 5 years for surveillance.                           - Patient has a contact number available for                             emergencies. The signs and symptoms of potential                            delayed complications were discussed with the                            patient. Return to normal activities tomorrow.                            Written discharge instructions were provided to the                            patient.                           - Resume previous diet.                           - Continue present medications.                           - Await pathology results. Docia Chuck. Henrene Pastor, MD 12/18/2016 3:32:45 PM This report has been signed electronically.

## 2016-12-18 NOTE — Progress Notes (Signed)
Called to room to assist during endoscopic procedure.  Patient ID and intended procedure confirmed with present staff. Received instructions for my participation in the procedure from the performing physician.  

## 2016-12-18 NOTE — [place] Notes (Signed)
To PACU, VSS. Report to [email or web address] 

## 2016-12-18 NOTE — Patient Instructions (Signed)
YOU HAD AN ENDOSCOPIC PROCEDURE TODAY AT [facility]:   Refer to the procedure report that was given to you for any specific questions about what was found during the examination.  If the procedure report does not answer your questions, please call your gastroenterologist to clarify.  If you requested that your care partner not be given the details of your procedure findings, then the procedure report has been included in a sealed envelope for you to review at your convenience later.  YOU SHOULD EXPECT: Some feelings of bloating in the abdomen. Passage of more gas than usual.  Walking can help get rid of the air that was put into your GI tract during the procedure and reduce the bloating. If you had a lower endoscopy (such as a colonoscopy or flexible sigmoidoscopy) you may notice spotting of blood in your stool or on the toilet paper. If you underwent a bowel prep for your procedure, you may not have a normal bowel movement for a few days.  Please Note:  You might notice some irritation and congestion in your nose or some drainage.  This is from the oxygen used during your procedure.  There is no need for concern and it should clear up in a day or so.  SYMPTOMS TO REPORT IMMEDIATELY:   Following lower endoscopy (colonoscopy or flexible sigmoidoscopy):  Excessive amounts of blood in the stool  Significant tenderness or worsening of abdominal pains  Swelling of the abdomen that is new, acute  Fever of 100F or higher  For urgent or emergent issues, a gastroenterologist can be reached at any hour by calling [phone].  Please see handouts on Polyps and Hemorrhoids.  DIET:  We do recommend a small meal at first, but then you may proceed to your regular diet.  Drink plenty of fluids but you should avoid alcoholic beverages for 24 hours.  ACTIVITY:  You should plan to take it easy for the rest of today and you should NOT DRIVE or use heavy machinery until tomorrow (because of  the sedation medicines used during the test).    FOLLOW UP: Our staff will call the number listed on your records the next business day following your procedure to check on you and address any questions or concerns that you may have regarding the information given to you following your procedure. If we do not reach you, we will leave a message.  However, if you are feeling well and you are not experiencing any problems, there is no need to return our call.  We will assume that you have returned to your regular daily activities without incident.  If any biopsies were taken you will be contacted by phone or by letter within the next 1-3 weeks.  Please call us at [phone] if you have not heard about the biopsies in 3 weeks.    SIGNATURES/CONFIDENTIALITY: You and/or your care partner have signed paperwork which will be entered into your electronic medical record.  These signatures attest to the fact that that the information above on your After Visit Summary has been reviewed and is understood.  Full responsibility of the confidentiality of this discharge information lies with you and/or your care-partner.  Thank you for letting us take care of your healthcare needs today.

## 2016-12-18 NOTE — Progress Notes (Signed)
Pt's states no medical or surgical changes since previsit or office visit. 

## 2016-12-19 ENCOUNTER — Telehealth: Payer: Self-pay | Admitting: *Deleted

## 2016-12-19 NOTE — Telephone Encounter (Signed)
  Follow up Call-  Call back number 12/18/2016 10/03/2015 09/07/2015  Post procedure Call Back phone  # [phone]  Permission to leave phone message Yes Yes Yes  Some recent data might be hidden     Patient questions:  Do you have a fever, pain , or abdominal swelling? No. Pain Score  0 *  Have you tolerated food without any problems? Yes.    Have you been able to return to your normal activities? Yes.    Do you have any questions about your discharge instructions: Diet   No. Medications  No. Follow up visit  No.  Do you have questions or concerns about your Care? No.  Actions: * If pain score is 4 or above: No action needed, pain <4.

## 2016-12-23 ENCOUNTER — Encounter: Payer: Self-pay | Admitting: Internal Medicine

## 2016-12-31 ENCOUNTER — Encounter: Payer: Self-pay | Admitting: Internal Medicine

## 2017-01-01 ENCOUNTER — Encounter: Payer: Self-pay | Admitting: Internal Medicine

## 2017-01-26 ENCOUNTER — Ambulatory Visit
Admission: RE | Admit: 2017-01-26 | Discharge: 2017-01-26 | Disposition: A | Discharge status: Home / Self Care | Payer: 59 | Source: Ambulatory Visit | Attending: Internal Medicine | Admitting: Internal Medicine

## 2017-01-26 DIAGNOSIS — Z1231 Encounter for screening mammogram for malignant neoplasm of breast: Secondary | ICD-10-CM

## 2017-01-30 ENCOUNTER — Other Ambulatory Visit: Payer: Self-pay | Admitting: Surgery

## 2017-01-30 DIAGNOSIS — R1011 Right upper quadrant pain: Secondary | ICD-10-CM

## 2017-02-11 ENCOUNTER — Other Ambulatory Visit: Payer: Self-pay

## 2017-02-16 ENCOUNTER — Other Ambulatory Visit: Payer: Self-pay

## 2017-03-02 ENCOUNTER — Ambulatory Visit
Admission: RE | Admit: 2017-03-02 | Discharge: 2017-03-02 | Disposition: A | Discharge status: Home / Self Care | Payer: 59 | Source: Ambulatory Visit | Attending: Surgery | Admitting: Surgery

## 2017-03-02 DIAGNOSIS — R1011 Right upper quadrant pain: Secondary | ICD-10-CM

## 2017-05-04 ENCOUNTER — Emergency Department (HOSPITAL_COMMUNITY): Payer: 59

## 2017-05-04 ENCOUNTER — Observation Stay (HOSPITAL_COMMUNITY)
Admission: EM | Admit: 2017-05-04 | Discharge: 2017-05-05 | Disposition: A | Discharge status: Home / Self Care | Payer: 59 | Attending: Cardiovascular Disease | Admitting: Cardiovascular Disease

## 2017-05-04 ENCOUNTER — Encounter (HOSPITAL_COMMUNITY): Payer: Self-pay | Admitting: Emergency Medicine

## 2017-05-04 ENCOUNTER — Other Ambulatory Visit: Payer: Self-pay

## 2017-05-04 DIAGNOSIS — Z880 Allergy status to penicillin: Secondary | ICD-10-CM | POA: Insufficient documentation

## 2017-05-04 DIAGNOSIS — F329 Major depressive disorder, single episode, unspecified: Secondary | ICD-10-CM | POA: Insufficient documentation

## 2017-05-04 DIAGNOSIS — H6021 Malignant otitis externa, right ear: Secondary | ICD-10-CM | POA: Insufficient documentation

## 2017-05-04 DIAGNOSIS — R072 Precordial pain: Principal | ICD-10-CM

## 2017-05-04 DIAGNOSIS — G43A1 Cyclical vomiting, intractable: Secondary | ICD-10-CM | POA: Insufficient documentation

## 2017-05-04 DIAGNOSIS — K5790 Diverticulosis of intestine, part unspecified, without perforation or abscess without bleeding: Secondary | ICD-10-CM | POA: Insufficient documentation

## 2017-05-04 DIAGNOSIS — Z91018 Allergy to other foods: Secondary | ICD-10-CM | POA: Insufficient documentation

## 2017-05-04 DIAGNOSIS — E7800 Pure hypercholesterolemia, unspecified: Secondary | ICD-10-CM

## 2017-05-04 DIAGNOSIS — R079 Chest pain, unspecified: Secondary | ICD-10-CM

## 2017-05-04 DIAGNOSIS — E039 Hypothyroidism, unspecified: Secondary | ICD-10-CM | POA: Insufficient documentation

## 2017-05-04 DIAGNOSIS — K219 Gastro-esophageal reflux disease without esophagitis: Secondary | ICD-10-CM | POA: Insufficient documentation

## 2017-05-04 DIAGNOSIS — Z881 Allergy status to other antibiotic agents status: Secondary | ICD-10-CM | POA: Diagnosis not present

## 2017-05-04 DIAGNOSIS — I441 Atrioventricular block, second degree: Secondary | ICD-10-CM | POA: Insufficient documentation

## 2017-05-04 DIAGNOSIS — I088 Other rheumatic multiple valve diseases: Secondary | ICD-10-CM | POA: Insufficient documentation

## 2017-05-04 DIAGNOSIS — I5032 Chronic diastolic (congestive) heart failure: Secondary | ICD-10-CM | POA: Insufficient documentation

## 2017-05-04 DIAGNOSIS — L719 Rosacea, unspecified: Secondary | ICD-10-CM | POA: Diagnosis not present

## 2017-05-04 DIAGNOSIS — E559 Vitamin D deficiency, unspecified: Secondary | ICD-10-CM | POA: Diagnosis not present

## 2017-05-04 DIAGNOSIS — Z79899 Other long term (current) drug therapy: Secondary | ICD-10-CM | POA: Insufficient documentation

## 2017-05-04 DIAGNOSIS — G43909 Migraine, unspecified, not intractable, without status migrainosus: Secondary | ICD-10-CM | POA: Diagnosis not present

## 2017-05-04 DIAGNOSIS — Z6834 Body mass index (BMI) 34.0-34.9, adult: Secondary | ICD-10-CM | POA: Diagnosis not present

## 2017-05-04 DIAGNOSIS — F419 Anxiety disorder, unspecified: Secondary | ICD-10-CM | POA: Insufficient documentation

## 2017-05-04 DIAGNOSIS — I319 Disease of pericardium, unspecified: Secondary | ICD-10-CM | POA: Diagnosis not present

## 2017-05-04 DIAGNOSIS — I472 Ventricular tachycardia: Secondary | ICD-10-CM | POA: Insufficient documentation

## 2017-05-04 DIAGNOSIS — E785 Hyperlipidemia, unspecified: Secondary | ICD-10-CM | POA: Insufficient documentation

## 2017-05-04 DIAGNOSIS — Z888 Allergy status to other drugs, medicaments and biological substances status: Secondary | ICD-10-CM | POA: Insufficient documentation

## 2017-05-04 DIAGNOSIS — H60391 Other infective otitis externa, right ear: Secondary | ICD-10-CM | POA: Insufficient documentation

## 2017-05-04 DIAGNOSIS — Z90710 Acquired absence of both cervix and uterus: Secondary | ICD-10-CM | POA: Insufficient documentation

## 2017-05-04 DIAGNOSIS — Z9049 Acquired absence of other specified parts of digestive tract: Secondary | ICD-10-CM | POA: Insufficient documentation

## 2017-05-04 DIAGNOSIS — E669 Obesity, unspecified: Secondary | ICD-10-CM | POA: Diagnosis not present

## 2017-05-04 DIAGNOSIS — Z87442 Personal history of urinary calculi: Secondary | ICD-10-CM | POA: Insufficient documentation

## 2017-05-04 DIAGNOSIS — Z86010 Personal history of colonic polyps: Secondary | ICD-10-CM | POA: Insufficient documentation

## 2017-05-04 DIAGNOSIS — Z7982 Long term (current) use of aspirin: Secondary | ICD-10-CM | POA: Insufficient documentation

## 2017-05-04 LAB — CBC WITH DIFFERENTIAL/PLATELET
BASOS ABS: 0 10*3/uL (ref 0.0–0.1)
Basophils Relative: 0 %
EOS ABS: 0.3 10*3/uL (ref 0.0–0.7)
EOS PCT: 4 %
HCT: 42.9 % (ref 36.0–46.0)
Hemoglobin: 13.9 g/dL (ref 12.0–15.0)
LYMPHS ABS: 2.3 10*3/uL (ref 0.7–4.0)
LYMPHS PCT: 26 %
MCH: 30.6 pg (ref 26.0–34.0)
MCHC: 32.4 g/dL (ref 30.0–36.0)
MCV: 94.5 fL (ref 78.0–100.0)
Monocytes Absolute: 0.5 10*3/uL (ref 0.1–1.0)
Monocytes Relative: 6 %
Neutro Abs: 5.7 10*3/uL (ref 1.7–7.7)
Neutrophils Relative %: 64 %
PLATELETS: 221 10*3/uL (ref 150–400)
RBC: 4.54 MIL/uL (ref 3.87–5.11)
RDW: 13.6 % (ref 11.5–15.5)
WBC: 8.9 10*3/uL (ref 4.0–10.5)

## 2017-05-04 LAB — I-STAT TROPONIN, ED: Troponin i, poc: 0 ng/mL (ref 0.00–0.08)

## 2017-05-04 LAB — CBC
HCT: 40.8 % (ref 36.0–46.0)
Hemoglobin: 13.4 g/dL (ref 12.0–15.0)
MCH: 30.9 pg (ref 26.0–34.0)
MCHC: 32.8 g/dL (ref 30.0–36.0)
MCV: 94.2 fL (ref 78.0–100.0)
PLATELETS: 97 10*3/uL — AB (ref 150–400)
RBC: 4.33 MIL/uL (ref 3.87–5.11)
RDW: 13.9 % (ref 11.5–15.5)
WBC: 7.4 10*3/uL (ref 4.0–10.5)

## 2017-05-04 LAB — BASIC METABOLIC PANEL
Anion gap: 11 (ref 5–15)
BUN: 10 mg/dL (ref 6–20)
CO2: 21 mmol/L — ABNORMAL LOW (ref 22–32)
CREATININE: 0.79 mg/dL (ref 0.44–1.00)
Calcium: 9.1 mg/dL (ref 8.9–10.3)
Chloride: 107 mmol/L (ref 101–111)
GFR calc Af Amer: >60 mL/min (ref >60)
GLUCOSE: 77 mg/dL (ref 65–99)
POTASSIUM: 3.9 mmol/L (ref 3.5–5.1)
SODIUM: 139 mmol/L (ref 135–145)

## 2017-05-04 LAB — TROPONIN I: Troponin I: <0.03 ng/mL (ref <0.03)

## 2017-05-04 LAB — CREATININE, SERUM: Creatinine, Ser: 0.81 mg/dL (ref 0.44–1.00)

## 2017-05-04 LAB — D-DIMER, QUANTITATIVE (NOT AT ARMC): D DIMER QUANT: 0.6 ug{FEU}/mL — AB (ref 0.00–0.50)

## 2017-05-04 MED ORDER — LEVOTHYROXINE SODIUM 75 MCG PO TABS
75.0000 ug | ORAL_TABLET | Freq: Every day | ORAL | Status: DC
Start: 2017-05-04 — End: 2017-05-05
  Administered 2017-05-04: 75 ug via ORAL
  Filled 2017-05-04: qty 1

## 2017-05-04 MED ORDER — ASPIRIN 81 MG PO CHEW: 324.0000 mg | CHEWABLE_TABLET | ORAL | Status: DC

## 2017-05-04 MED ORDER — ONDANSETRON HCL 4 MG/2ML IJ SOLN
4.0000 mg | Freq: Four times a day (QID) | INTRAMUSCULAR | Status: DC | PRN
  Administered 2017-05-04: 4 mg via INTRAVENOUS
  Filled 2017-05-04 (×2): qty 2

## 2017-05-04 MED ORDER — ASPIRIN EC 81 MG PO TBEC
81.0000 mg | DELAYED_RELEASE_TABLET | Freq: Every day | ORAL | Status: DC
  Administered 2017-05-05: 81 mg via ORAL
  Filled 2017-05-04: qty 1

## 2017-05-04 MED ORDER — ZOLPIDEM TARTRATE 5 MG PO TABS: 10.0000 mg | ORAL_TABLET | Freq: Every evening | ORAL | Status: DC | PRN

## 2017-05-04 MED ORDER — ACETAMINOPHEN 325 MG PO TABS
650.0000 mg | ORAL_TABLET | ORAL | Status: DC | PRN
  Administered 2017-05-04: 650 mg via ORAL
  Filled 2017-05-04: qty 2

## 2017-05-04 MED ORDER — ROSUVASTATIN CALCIUM 10 MG PO TABS: 5.0000 mg | ORAL_TABLET | Freq: Every day | ORAL | Status: DC

## 2017-05-04 MED ORDER — HEPARIN SODIUM (PORCINE) 5000 UNIT/ML IJ SOLN
5000.0000 [IU] | Freq: Three times a day (TID) | INTRAMUSCULAR | Status: DC
  Administered 2017-05-04 – 2017-05-05 (×2): 5000 [IU] via SUBCUTANEOUS
  Filled 2017-05-04 (×3): qty 1

## 2017-05-04 MED ORDER — NITROGLYCERIN 0.4 MG SL SUBL: 0.4000 mg | SUBLINGUAL_TABLET | SUBLINGUAL | Status: DC | PRN

## 2017-05-04 MED ORDER — ASPIRIN 300 MG RE SUPP: 300.0000 mg | RECTAL | Status: DC

## 2017-05-04 MED ORDER — ALPRAZOLAM 0.5 MG PO TABS: 0.5000 mg | ORAL_TABLET | Freq: Every evening | ORAL | Status: DC | PRN

## 2017-05-04 MED ORDER — ZONISAMIDE 100 MG PO CAPS
200.0000 mg | ORAL_CAPSULE | Freq: Every day | ORAL | Status: DC
  Administered 2017-05-04: 200 mg via ORAL
  Filled 2017-05-04 (×3): qty 2

## 2017-05-04 MED ORDER — LAMOTRIGINE 100 MG PO TABS
200.0000 mg | ORAL_TABLET | Freq: Every day | ORAL | Status: DC
  Administered 2017-05-04: 200 mg via ORAL
  Filled 2017-05-04: qty 1

## 2017-05-04 MED ORDER — KETOROLAC TROMETHAMINE 15 MG/ML IJ SOLN
15.0000 mg | Freq: Four times a day (QID) | INTRAMUSCULAR | Status: DC | PRN
  Administered 2017-05-05: 15 mg via INTRAVENOUS
  Filled 2017-05-04: qty 1

## 2017-05-04 MED ORDER — SODIUM CHLORIDE 0.9 % IV BOLUS
500.0000 mL | Freq: Once | INTRAVENOUS | Status: AC
  Administered 2017-05-04: 500 mL via INTRAVENOUS

## 2017-05-04 MED ORDER — METOPROLOL TARTRATE 50 MG PO TABS
50.0000 mg | ORAL_TABLET | Freq: Once | ORAL | Status: AC
  Administered 2017-05-05: 50 mg via ORAL
  Filled 2017-05-04: qty 1

## 2017-05-04 MED ORDER — NITROGLYCERIN 2 % TD OINT
1.0000 [in_us] | TOPICAL_OINTMENT | Freq: Four times a day (QID) | TRANSDERMAL | Status: DC
  Administered 2017-05-04: 1 [in_us] via TOPICAL
  Filled 2017-05-04: qty 1

## 2017-05-04 NOTE — ED Notes (Signed)
Pt ambulated to bathroom with spouse assistance-Monique,RN

## 2017-05-04 NOTE — ED Notes (Signed)
ED Provider at bedside. 

## 2017-05-04 NOTE — ED Triage Notes (Signed)
Per EMS: Pt from PCP with CP, left arm pain, SOB, and nausea.  Family stated pt recently lost her father.  Pt denies dizziness.  PCP administered 324 ASA, and 1 Nitro.

## 2017-05-04 NOTE — ED Notes (Signed)
Pt's nitro patch taken off per EDP request due to patient has a HA-Monique,RN

## 2017-05-04 NOTE — H&P (Addendum)
History & Physical    Patient ID: LATAUNYA RUUD MRN: [id number], DOB/AGE: May 22, 1965   Admit date: 05/04/2017   Primary Physician: Josetta Huddle, MD Primary Cardiologist: Dr. Acie Fredrickson   Patient Profile    52 yo female with PMH of HL, depression, chronic diastolic HF, hypothyroidism, and pericarditis who presented with chest pain for the past couple of weeks.   Past Medical History   Past Medical History:  Diagnosis Date  . Allergy   . Anxiety   . Bladder disorder   . Colitis 2006  . Colon polyps   . Complication of anesthesia    problems waking up  . Diastolic congestive heart failure ([place])    with pericarditis- no issues last 5 years 09-2015  . Diverticulosis   . History of depression   . History of migraines   . Hyperlipemia   . Hypothyroidism   . Kidney stone   . Paroxysmal ventricular tachycardia ([place])   . Pericarditis   . Rapid heart rate    maybe once a month since pericarditis  . Rosacea   . SBO (small bowel obstruction) ([place]) 08/16/2015  . Vitamin D deficiency     Past Surgical History:  Procedure Laterality Date  . APPENDECTOMY    . CARDIAC CATHETERIZATION  11/15/08   SMOOTH AND NORMAL  . CHOLECYSTECTOMY    . COLONOSCOPY    . LAPAROSCOPY    . LAPAROSCOPY N/A 10/26/2015   Procedure: diagnostic LAPAROSCOPY LAPAROTOMY AND APPENDECTOMY;  Surgeon: Johnathan Hausen, MD;  Location: [facility];  Service: General;  Laterality: N/A;  . LAPAROTOMY  2017  . POLYPECTOMY  02/2009  . SHOULDER SURGERY     left  . TOTAL ABDOMINAL HYSTERECTOMY W/ BILATERAL SALPINGOOPHORECTOMY    . UPPER GASTROINTESTINAL ENDOSCOPY       Allergies  Allergies  Allergen Reactions  . Amoxicillin Other (See Comments)    Face, eyes, chest turned red  . Ampicillin     SERUM SICKNESS REACTION  . Imitrex [Sumatriptan] Other (See Comments)    Chest Pain  . Other     Fruits- peaches, cheeries, strawberries, apples, cats   . Prednisone Other (See Comments)    Eye Irritation.  Marland Kitchen Zoloft  [Sertraline Hcl] Other (See Comments)    hyperactive    History of Present Illness    Mrs. Starliper is a 52 yo female with PMH of HL, depression, chronic diastolic HF, hypothyroidism, and pericarditis. She was seen by Dr. Acie Fredrickson back in 2012 and had a cath with normal coronaries. Post cath she developed significant pleuritic chest pain and was placed on Indocin with improvement. States there was talk of sending her to [facility] for a biopsy but her symptoms resolved and this was deferred. She was placed on metoprolol 100mg  BID but was able to titrated down. Had been on Seroquel for depression which caused her LDL to rise. Last check per her report was 200. Has been on Crestor 5mg  3 times a week. Currently working to wean the Seroquel.   Reports since back in the middle of Feb she began having centralized chest pressure/pain. Does not feel that it is necessarily exertional in nature. When she develops an episode typically takes ASA with improvement. Has had several episodes with radiation into the left arm and jaw. Recently lost her father about a week ago. He presented as a STEMI, underwent cath then CABG but developed a stroke post op. She is adopted. Over the past couple of days she symptoms seem to have  been worse. This morning chest pain presented again with radiation into the left arm. Called her PCP and went to the office with symptoms. Office called EMS and she was brought directly to the ED.   In the ED her EKG showed SR with no acute ischemia. Labs pending. Was given a SL nitro en route which resolved her arm pain. Still with 5/10 chest pain on exam. Does have hx of hernia and GERD as well.   Home Medications    Prior to Admission medications   Medication Sig Start Date End Date Taking? Authorizing Provider  ALPRAZolam Duanne Moron) 1 MG tablet Take 0.5 mg by mouth at bedtime as needed for anxiety.    [provider]  Est Estrogens-Methyltest (ESTRATEST PO) Take 1 tablet by mouth daily.      [provider]  lamoTRIgine (LAMICTAL) 200 MG tablet Take 200 mg by mouth at bedtime.  06/29/13   [provider]  levothyroxine (SYNTHROID, LEVOTHROID) 75 MCG tablet Take 75 mcg by mouth at bedtime.     [provider]  metoprolol tartrate (LOPRESSOR) 25 MG tablet TAKE 1 TABLET BY MOUTH 2 TIMES DAILY Patient taking differently: TAKE 1 TABLET BY MOUTH EVERY NIGHT. 01/31/15   Nahser, Wonda Cheng, MD  metroNIDAZOLE (METROGEL) 0.75 % gel APPLY TO AFFECTED AREA ONCE DAILY 11/18/16   [provider]  ondansetron (ZOFRAN ODT) 8 MG disintegrating tablet Take 1 tab every 6 hours as needed for nausea. 08/22/15   Esterwood, Amy S, PA-C  QUEtiapine (SEROQUEL XR) 300 MG 24 hr tablet Take 300 mg by mouth at bedtime.    [provider]  rizatriptan (MAXALT) 10 MG tablet Take 10 mg by mouth daily as needed for migraine. May repeat in 2 hours if needed     [provider]  zolpidem (AMBIEN) 10 MG tablet Take 10 mg by mouth at bedtime as needed for sleep.  06/10/13   [provider]  zonisamide (ZONEGRAN) 100 MG capsule TAKE 2 CAPSULES BY MOUTH EVERY DAY AT BEDTIME 11/28/16   [provider]    Family History    Family History  Adopted: Yes    Social History    Social History   Socioeconomic History  . Marital status: Married    Spouse name: Not on file  . Number of children: 1 A  . Years of education: MA  . Highest education level: Not on file  Occupational History  . Occupation: Corporate investment banker: [employer]  . Financial resource strain: Not on file  . Food insecurity:    Worry: Not on file    Inability: Not on file  . Transportation needs:    Medical: Not on file    Non-medical: Not on file  Tobacco Use  . Smoking status: Never Smoker  . Smokeless tobacco: Never Used  Substance and Sexual Activity  . Alcohol use: No  . Drug use: No  . Sexual activity: Not on file  Lifestyle  .  Physical activity:    Days per week: Not on file    Minutes per session: Not on file  . Stress: Not on file  Relationships  . Social connections:    Talks on phone: Not on file    Gets together: Not on file    Attends religious service: Not on file    Active member of club or organization: Not on file    Attends meetings of clubs or organizations: Not on  file    Relationship status: Not on file  . Intimate partner violence:    Fear of current or ex partner: Not on file    Emotionally abused: Not on file    Physically abused: Not on file    Forced sexual activity: Not on file  Other Topics Concern  . Not on file  Social History Narrative   Patient lives at home with her family.   Caffeine Use: quit in 02/2011.     Review of Systems    See HPI  All other systems reviewed and are otherwise negative except as noted above.  Physical Exam    Blood pressure 109/70, pulse 78, resp. rate (!) 21, SpO2 99 %.  General: Ill appearing WF, NAD Psych: Normal affect. Neuro: Alert and oriented X 3. Moves all extremities spontaneously. HEENT: Normal  Neck: Supple without bruits or JVD. Lungs:  Resp regular and unlabored, CTA. Heart: RRR no s3, s4, or murmurs. Abdomen: Soft, non-tender, non-distended, BS + x 4.  Extremities: No clubbing, cyanosis or edema. DP/PT/Radials 2+ and equal bilaterally.  Labs    Troponin (Point of Care Test) No results for input(s): TROPIPOC in the last 72 hours. No results for input(s): CKTOTAL, CKMB, TROPONINI in the last 72 hours. Lab Results  Component Value Date   WBC 11.4 (H) 11/08/2015   HGB 11.6 (L) 11/08/2015   HCT 35.9 (L) 11/08/2015   MCV 93.0 11/08/2015   PLT 250 11/08/2015   No results for input(s): NA, K, CL, CO2, BUN, CREATININE, CALCIUM, PROT, BILITOT, ALKPHOS, ALT, AST, GLUCOSE in the last 168 hours.  Invalid input(s): LABALBU Lab Results  Component Value Date   CHOL 165 05/01/2009   HDL 23 (A) 05/01/2009   LDLCALC 101 05/01/2009    TRIG 205 (A) 05/01/2009   Lab Results  Component Value Date   DDIMER 0.35 03/22/2011     Radiology Studies    No results found.  ECG & Cardiac Imaging    EKG: SR   Assessment & Plan    52 yo female with PMH of HL, depression, chronic diastolic HF, hypothyroidism, and pericarditis who presented with chest pain for the past couple of weeks.   1. Chest pain: Reports having intermittent symptoms since Feb. Not typically exertional in nature but improve with ASA. Episodes have become more often over the past week and consistent this morning around 8am. Went to PCP and transferred to the ED for work up. Labs pending in the ED. EKG non ischemic while actively having chest discomfort.  -- admit to stepdown -- cycle trops, if negative plan for coronary CT. If positive will need cath.  -- add nitro paste -- check echo  2. Hypothyroidism: on levothyroxine 75 mcg -- check TSH  3. Hx of chronic diastolic HF: No signs of HF on exam.  -- check echo  4. Depression: Reports she is weaning from her Seroquel.   Severity of Illness: The appropriate patient status for this patient is OBSERVATION. Observation status is judged to be reasonable and necessary in order to provide the required intensity of service to ensure the patient's safety. The patient's presenting symptoms, physical exam findings, and initial radiographic and laboratory data in the context of their medical condition is felt to place them at decreased risk for further clinical deterioration. Furthermore, it is anticipated that the patient will be medically stable for discharge from the hospital within 2 midnights of admission. The following factors support the patient status of observation.   "  The patient's presenting symptoms include chest pain, shortness of breath. " The physical exam findings include normal physical exam. " The initial radiographic and laboratory data are labs pending at the time of admission, EKG SR with no  acute ischemia.   Barnet Pall, NP-C Pager [phone] 05/04/2017, 4:44 PM   I have seen and examined the patient along with Reino Bellis, NP-C.  I have reviewed the chart, notes and new data.  I agree with PA/NP's note.  Key new complaints: symptoms consistent with angina, relieved by SL NTG. Differential diagnosis is esophageal spasm. She has been under a big emotional stress, but the chest pain syndrome onset was prior to her father's illness and demise.Major risk factor is reported LDL>200. FHx not known (adopted). Nonsmoker. Key examination changes: normal CV exam, obese Key new findings / data: normal ECG except borderline PR interval (ECG performed during mild chest discomfort), labs pending  PLAN: Observation overnight. Check troponin - if normal, recommend coronary CT angiogram. If abnormal proceed directly to cath.  Sanda Klein, MD, [place] [phone] 05/04/2017, 5:43 PM

## 2017-05-04 NOTE — ED Notes (Addendum)
Sent pharmacy a message to verify nitro ointment.

## 2017-05-04 NOTE — ED Notes (Signed)
ORDERED HH TRAY

## 2017-05-04 NOTE — ED Provider Notes (Signed)
[facility] EMERGENCY DEPARTMENT Provider Note   CSN: [id number] Arrival date & time: 05/04/17  1633     History   Chief Complaint Chief Complaint  Patient presents with  . Chest Pain  . Shortness of Breath    HPI Cynthia [name] is a 52 y.o. female.  HPI   Patient is a 52 year old female presenting with left arm pain.  Patient reports she had this left arm point pain for the last several months.  She reports that her father recently died from a large MI (she was adopted).  She had no chest pain for the last week and then starting yesterday started having some left arm pain.  Patient endorses a small amount of shortness of breath as well.  No diaphoresis.  Mild nausea.  No vomiting.  Patient finally told her family who made her come here to be evaluated.    Past Medical History:  Diagnosis Date  . Allergy   . Anxiety   . Bladder disorder   . Colitis 2006  . Colon polyps   . Complication of anesthesia    problems waking up  . Diastolic congestive heart failure ([place])    with pericarditis- no issues last 5 years 09-2015  . Diverticulosis   . History of depression   . History of migraines   . Hyperlipemia   . Hypothyroidism   . Kidney stone   . Paroxysmal ventricular tachycardia ([place])   . Pericarditis   . Rapid heart rate    maybe once a month since pericarditis  . Rosacea   . SBO (small bowel obstruction) ([place]) 08/16/2015  . Vitamin D deficiency     Patient Active Problem List   Diagnosis Date Noted  . Chest pain 05/04/2017  . Infective otitis externa of right ear   . 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 Sepsis ([place]) 11/07/2015  . Malignant otitis externa 11/07/2015  . Acute malignant otitis externa of right ear   . 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 Partial small bowel obstruction ([place])   . Abdominal pain 08/16/2015  . Generalized abdominal pain   . Intractable cyclical vomiting with nausea   . Pericarditis   . Pericarditis,  acute 05/03/2010  . Obesity 05/03/2010  . Diastolic congestive heart failure ([place])   . Hyperlipemia   . Hypothyroidism   . History of depression   . History of migraine   . Rosacea   . Anxiety     Past Surgical History:  Procedure Laterality Date  . APPENDECTOMY    . CARDIAC CATHETERIZATION  11/15/08   SMOOTH AND NORMAL  . CHOLECYSTECTOMY    . COLONOSCOPY    . LAPAROSCOPY    . LAPAROSCOPY N/A 10/26/2015   Procedure: diagnostic LAPAROSCOPY LAPAROTOMY AND APPENDECTOMY;  Surgeon: Johnathan Hausen, MD;  Location: [facility];  Service: General;  Laterality: N/A;  . LAPAROTOMY  2017  . POLYPECTOMY  02/2009  . SHOULDER SURGERY     left  . TOTAL ABDOMINAL HYSTERECTOMY W/ BILATERAL SALPINGOOPHORECTOMY    . UPPER GASTROINTESTINAL ENDOSCOPY       OB History   None      Home Medications    Prior to Admission medications   Medication Sig Start Date End Date Taking? Authorizing Provider  ALPRAZolam Duanne Moron) 1 MG tablet Take 0.5 mg by mouth at bedtime as needed for anxiety or sleep.    Yes [provider]  clindamycin (CLINDAGEL) 1 % gel Apply 1 application topically  2 (two) times daily as needed (for acne).  04/05/17  Yes [provider]  estrogens-methylTEST (ESTRATEST) 1.25-2.5 MG tablet Take 1 tablet by mouth daily. 03/30/17  Yes [provider]  lamoTRIgine (LAMICTAL) 150 MG tablet Take 150 mg by mouth at bedtime.   Yes [provider]  levothyroxine (SYNTHROID, LEVOTHROID) 75 MCG tablet Take 75 mcg by mouth at bedtime.    Yes [provider]  methocarbamol (ROBAXIN) 500 MG tablet Take 1-2 tablets by mouth 3 (three) times daily as needed (FOR COLON SPASMS).  04/15/17  Yes [provider]  metoprolol tartrate (LOPRESSOR) 25 MG tablet TAKE 1 TABLET BY MOUTH 2 TIMES DAILY Patient taking differently: Take 25 mg by mouth at bedtime 01/31/15  Yes Nahser, Wonda Cheng, MD  metroNIDAZOLE (METROGEL) 0.75 % gel Apply to face once a day as needed/as  directed 11/18/16  Yes [provider]  QUEtiapine (SEROQUEL XR) 200 MG 24 hr tablet Take 200 mg by mouth at bedtime. 04/24/17  Yes [provider]  rizatriptan (MAXALT) 10 MG tablet Take 10 mg by mouth daily as needed for migraine. May repeat in 2 hours if needed    Yes [provider]  rosuvastatin (CRESTOR) 40 MG tablet Take 40 mg by mouth every Monday, Wednesday, and Friday at 8 PM.  04/12/17  Yes [provider]  zolpidem (AMBIEN) 10 MG tablet Take 10 mg by mouth at bedtime as needed for sleep.  06/10/13  Yes [provider]  zonisamide (ZONEGRAN) 100 MG capsule Take 200 mg by mouth at bedtime 11/28/16  Yes [provider]  ondansetron (ZOFRAN ODT) 8 MG disintegrating tablet Take 1 tab every 6 hours as needed for nausea. Patient taking differently: Take 8 mg by mouth every 6 (six) hours as needed for nausea.  08/22/15   Esterwood, Amy Chauncey Cruel, PA-C    Family History Family History  Adopted: Yes    Social History Social History   Tobacco Use  . Smoking status: Never Smoker  . Smokeless tobacco: Never Used  Substance Use Topics  . Alcohol use: No  . Drug use: No     Allergies   Amoxicillin; Ampicillin; Apple fruit extract; Cherry extract; Fruit & vegetable daily [nutritional supplements]; Imitrex [sumatriptan]; Other; Peach [prunus persica]; Prednisone; Strawberry extract; and Zoloft [sertraline hcl]   Review of Systems Review of Systems  Constitutional: Negative for activity change, fatigue and fever.  Respiratory: Positive for chest tightness.   Cardiovascular: Negative for chest pain.  Gastrointestinal: Negative for abdominal pain.  All other systems reviewed and are negative.    Physical Exam Updated Vital Signs BP 107/69   Pulse 77   Resp 11   SpO2 98%   Physical Exam  Constitutional: She is oriented to person, place, and time. She appears well-developed and well-nourished.  Very comfortable appearing 52 year old  female resting in bed.  HENT:  Head: Normocephalic and atraumatic.  Eyes: Right eye exhibits no discharge.  Cardiovascular: Normal rate, regular rhythm and normal heart sounds.  No murmur heard. Pulmonary/Chest: Effort normal and breath sounds normal. She has no wheezes. She has no rales.  Abdominal: Soft. She exhibits no distension. There is no tenderness.  Neurological: She is oriented to person, place, and time.  Skin: Skin is warm and dry. She is not diaphoretic.  Psychiatric: She has a normal mood and affect.  Nursing note and vitals reviewed.    ED Treatments / Results  Labs (all labs ordered are listed, but only abnormal results  are displayed) Labs Reviewed  BASIC METABOLIC PANEL - Abnormal; Notable for the following components:      Result Value   CO2 21 (*)    All other components within normal limits  D-DIMER, QUANTITATIVE (NOT AT [facility]) - Abnormal; Notable for the following components:   D-Dimer, Quant 0.60 (*)    All other components within normal limits  TROPONIN I  CBC WITH DIFFERENTIAL/PLATELET  TROPONIN I  TROPONIN I  HIV ANTIBODY (ROUTINE TESTING)  CBC  CREATININE, SERUM  HEMOGLOBIN T2W  BASIC METABOLIC PANEL  LIPID PANEL  TSH  I-STAT TROPONIN, ED    EKG EKG Interpretation  Date/Time:  Monday May 04 2017 16:36:38 EDT Ventricular Rate:  75 PR Interval:    QRS Duration: 103 QT Interval:  380 QTC Calculation: 425 R Axis:   94 Text Interpretation:  Sinus rhythm Borderline prolonged PR interval S1,S2,S3 pattern Normal sinus rhythm Confirmed by Thomasene Lot, [place] [phone]) on 05/04/2017 5:51:58 PM   Radiology Dg Chest 2 View  Result Date: 05/04/2017 CLINICAL DATA:  Chest pain and shortness of breath. EXAM: CHEST - 2 VIEW COMPARISON:  04/27/2013 FINDINGS: Examination is degraded due to patient body habitus. Grossly unchanged cardiac silhouette and mediastinal contours. No focal airspace opacities. No pleural effusion or pneumothorax. No evidence of edema.  No acute osseus abnormalities. Stigmata of DISH within the lower thoracic spine. Post cholecystectomy. IMPRESSION: No acute cardiopulmonary disease. Electronically Signed   By: Sandi Mariscal M.D.   On: 05/04/2017 18:04    Procedures Procedures (including critical care time)  Medications Ordered in ED Medications  ALPRAZolam (XANAX) tablet 0.5 mg (has no administration in time range)  levothyroxine (SYNTHROID, LEVOTHROID) tablet 75 mcg (has no administration in time range)  zolpidem (AMBIEN) tablet 10 mg (has no administration in time range)  zonisamide (ZONEGRAN) capsule 200 mg (has no administration in time range)  lamoTRIgine (LAMICTAL) tablet 200 mg (has no administration in time range)  aspirin chewable tablet 324 mg (324 mg Oral Not Given 05/04/17 2036)    Or  aspirin suppository 300 mg ( Rectal See Alternative 05/04/17 2036)  aspirin EC tablet 81 mg (has no administration in time range)  nitroGLYCERIN (NITROSTAT) SL tablet 0.4 mg (has no administration in time range)  acetaminophen (TYLENOL) tablet 650 mg (has no administration in time range)  ondansetron (ZOFRAN) injection 4 mg (has no administration in time range)  heparin injection 5,000 Units (has no administration in time range)  rosuvastatin (CRESTOR) tablet 5 mg (has no administration in time range)  nitroGLYCERIN (NITROGLYN) 2 % ointment 1 inch (1 inch Topical Given 05/04/17 1810)  metoprolol tartrate (LOPRESSOR) tablet 50 mg (has no administration in time range)  sodium chloride 0.9 % bolus 500 mL (has no administration in time range)     Initial Impression / Assessment and Plan / ED Course  I have reviewed the triage vital signs and the nursing notes.  Pertinent labs & imaging results that were available during my care of the patient were reviewed by me and considered in my medical decision making (see chart for details).     Patient is a 52 year old female presenting with left arm pain.  Patient reports she had this left  arm point pain for the last several months.  She reports that her father recently died from a large MI (she was adopted).  She had no chest pain for the last week and then starting yesterday started having some left arm pain.  Patient endorses a small amount of  shortness of breath as well.  No diaphoresis.  Mild nausea.  No vomiting.  Patient finally told her family who made her come here to be evaluated.  5:52 PM Patient's initial EKG is nonischemic.  Will send d-dimer, troponins.  Cardiology  is familiar with patient and will plan on admitting.  8:39 PM D-dimer is only slightly elevated.  In the absence of tachycardia, hypoxia, and currently pain-free, would not pursue pulmonary embolism study given that patient is planning to have a cardiac CT per cardiology tomorrow.  Will allow the cardiology team to decide whether to pursue further workup.  Final Clinical Impressions(s) / ED Diagnoses   Final diagnoses:  Chest pain    ED Discharge Orders    None       Macarthur Critchley, MD 05/04/17 2041

## 2017-05-05 ENCOUNTER — Other Ambulatory Visit: Payer: Self-pay

## 2017-05-05 ENCOUNTER — Observation Stay (HOSPITAL_COMMUNITY): Payer: 59

## 2017-05-05 ENCOUNTER — Observation Stay (HOSPITAL_BASED_OUTPATIENT_CLINIC_OR_DEPARTMENT_OTHER): Payer: 59

## 2017-05-05 DIAGNOSIS — R072 Precordial pain: Secondary | ICD-10-CM | POA: Diagnosis not present

## 2017-05-05 DIAGNOSIS — R079 Chest pain, unspecified: Secondary | ICD-10-CM

## 2017-05-05 DIAGNOSIS — I361 Nonrheumatic tricuspid (valve) insufficiency: Secondary | ICD-10-CM | POA: Diagnosis not present

## 2017-05-05 LAB — LIPID PANEL
CHOL/HDL RATIO: 6.6 ratio
Cholesterol: 164 mg/dL (ref 0–200)
HDL: 25 mg/dL — AB (ref >40)
LDL CALC: 119 mg/dL — AB (ref 0–99)
Triglycerides: 101 mg/dL (ref <150)
VLDL: 20 mg/dL (ref 0–40)

## 2017-05-05 LAB — ECHOCARDIOGRAM COMPLETE
Height: 68 in
WEIGHTICAEL: 3622.4 [oz_av]

## 2017-05-05 LAB — BASIC METABOLIC PANEL
Anion gap: 10 (ref 5–15)
BUN: 10 mg/dL (ref 6–20)
CALCIUM: 8.7 mg/dL — AB (ref 8.9–10.3)
CO2: 21 mmol/L — ABNORMAL LOW (ref 22–32)
Chloride: 107 mmol/L (ref 101–111)
Creatinine, Ser: 0.9 mg/dL (ref 0.44–1.00)
GFR calc Af Amer: >60 mL/min (ref >60)
GLUCOSE: 106 mg/dL — AB (ref 65–99)
Potassium: 3.5 mmol/L (ref 3.5–5.1)
SODIUM: 138 mmol/L (ref 135–145)

## 2017-05-05 LAB — TROPONIN I

## 2017-05-05 LAB — HEMOGLOBIN A1C
HEMOGLOBIN A1C: 5.6 % (ref 4.8–5.6)
MEAN PLASMA GLUCOSE: 114.02 mg/dL

## 2017-05-05 LAB — HIV ANTIBODY (ROUTINE TESTING W REFLEX): HIV Screen 4th Generation wRfx: NONREACTIVE

## 2017-05-05 LAB — TSH: TSH: 0.744 u[IU]/mL (ref 0.350–4.500)

## 2017-05-05 MED ORDER — NITROGLYCERIN 0.4 MG SL SUBL
SUBLINGUAL_TABLET | SUBLINGUAL | Status: AC
  Filled 2017-05-05: qty 2

## 2017-05-05 MED ORDER — IOPAMIDOL (ISOVUE-370) INJECTION 76%
INTRAVENOUS | Status: AC
  Administered 2017-05-05: 80 mL
  Filled 2017-05-05: qty 100

## 2017-05-05 MED ORDER — METOPROLOL TARTRATE 5 MG/5ML IV SOLN
INTRAVENOUS | Status: AC
  Administered 2017-05-05: 5 mg via INTRAVENOUS
  Filled 2017-05-05: qty 20

## 2017-05-05 MED ORDER — ZOLPIDEM TARTRATE 5 MG PO TABS: 5.0000 mg | ORAL_TABLET | Freq: Every evening | ORAL | Status: DC | PRN

## 2017-05-05 MED ORDER — METOPROLOL TARTRATE 5 MG/5ML IV SOLN
5.0000 mg | INTRAVENOUS | Status: DC | PRN
  Administered 2017-05-05 (×3): 5 mg via INTRAVENOUS

## 2017-05-05 MED ORDER — NITROGLYCERIN 0.4 MG SL SUBL
0.8000 mg | SUBLINGUAL_TABLET | SUBLINGUAL | Status: DC | PRN
  Administered 2017-05-05: 0.8 mg via SUBLINGUAL

## 2017-05-05 MED ORDER — ESOMEPRAZOLE MAGNESIUM 20 MG PO CPDR: 20.0000 mg | DELAYED_RELEASE_CAPSULE | Freq: Every day | ORAL | 1 refills | Status: DC

## 2017-05-05 NOTE — Progress Notes (Addendum)
Progress Note  Patient Name: Cynthia [name] Date of Encounter: 05/05/2017  Primary Cardiologist: No primary care provider on file.   Subjective   One episode of chest discomfort yesterday after ambulation to bathroom.   Inpatient Medications    Scheduled Meds: . aspirin  324 mg Oral NOW   Or  . aspirin  300 mg Rectal NOW  . aspirin EC  81 mg Oral Daily  . heparin  5,000 Units Subcutaneous Q8H  . lamoTRIgine  200 mg Oral QHS  . levothyroxine  75 mcg Oral QHS  . metoprolol tartrate  50 mg Oral Once  . nitroGLYCERIN  1 inch Topical Q6H  . rosuvastatin  5 mg Oral q1800  . zonisamide  200 mg Oral Daily   Continuous Infusions:  PRN Meds: acetaminophen, ALPRAZolam, ketorolac, nitroGLYCERIN, ondansetron (ZOFRAN) IV, zolpidem   Vital Signs    Vitals:   05/05/17 0400 05/05/17 0453 05/05/17 0600 05/05/17 0700  BP: 121/78 110/67 120/69 125/72  Pulse: 86 88 81 78  Resp: 18 13 17 11   Temp:  97.9 F (36.6 C)  98.2 F (36.8 C)  TempSrc:  Oral  Oral  SpO2: 94% 95% 91% 95%  Weight:  226 lb 6.4 oz (102.7 kg)    Height:  5\' 8"  (1.727 m)      Intake/Output Summary (Last 24 hours) at 05/05/2017 0848 Last data filed at 05/04/2017 2207 Gross per 24 hour  Intake 500 ml  Output -  Net 500 ml   Filed Weights   05/05/17 0453  Weight: 226 lb 6.4 oz (102.7 kg)    Telemetry    SR at 80s. One episode of type I,2nd degree AV block - Personally Reviewed  ECG    N/A  Physical Exam   GEN: No acute distress.   Neck: No JVD Cardiac: RRR, no murmurs, rubs, or gallops.  Respiratory: Clear to auscultation bilaterally. GI: Soft, nontender, non-distended  MS: No edema; No deformity. Neuro:  Nonfocal  Psych: Normal affect   Labs    Chemistry Recent Labs  Lab 05/04/17 1657 05/04/17 2055 05/05/17 0602  NA 139  --  138  K 3.9  --  3.5  CL 107  --  107  CO2 21*  --  21*  GLUCOSE 77  --  106*  BUN 10  --  10  CREATININE 0.79 0.81 0.90  CALCIUM 9.1  --  8.7*  GFRNONAA >60 >60  >60  GFRAA >60 >60 >60  ANIONGAP 11  --  10     Hematology Recent Labs  Lab 05/04/17 1657 05/04/17 2055  WBC 8.9 7.4  RBC 4.54 4.33  HGB 13.9 13.4  HCT 42.9 40.8  MCV 94.5 94.2  MCH 30.6 30.9  MCHC 32.4 32.8  RDW 13.6 13.9  PLT 221 97*    Cardiac Enzymes Recent Labs  Lab 05/04/17 1657 05/04/17 2243 05/05/17 0602  TROPONINI <0.03 <0.03 <0.03    Recent Labs  Lab 05/04/17 1732  TROPIPOC 0.00     BNPNo results for input(s): BNP, PROBNP in the last 168 hours.   DDimer  Recent Labs  Lab 05/04/17 1822  DDIMER 0.60*     Radiology    Dg Chest 2 View  Result Date: 05/04/2017 CLINICAL DATA:  Chest pain and shortness of breath. EXAM: CHEST - 2 VIEW COMPARISON:  04/27/2013 FINDINGS: Examination is degraded due to patient body habitus. Grossly unchanged cardiac silhouette and mediastinal contours. No focal airspace opacities. No pleural effusion or pneumothorax.  No evidence of edema. No acute osseus abnormalities. Stigmata of DISH within the lower thoracic spine. Post cholecystectomy. IMPRESSION: No acute cardiopulmonary disease. Electronically Signed   By: Sandi Mariscal M.D.   On: 05/04/2017 18:04    Cardiac Studies   Pending coronary CT and echo.   Patient Profile     52 yo female with PMH of HL, depression, chronic diastolic HF, hypothyroidism, and pericarditis who presented with chest pain for the past couple of weeks.   Assessment & Plan    1. Chest pain - Troponin has been remained negative. EKG without acute ischemic changes. TSH normal. Pending coronary CT and echo. Prior GI work up was negative. Has drove to [place] about week ago however her symptoms started before this. D-dimer of 0.6. Will defer to MD.   2. 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 underlying sleep apnea. Continue to follow.   For questions or updates, please contact [place] Please consult [email or web address] for contact info under Cardiology/STEMI.        SignedLeanor Kail, PA  05/05/2017, 8:48 AM    I have seen and examined the patient along with Leanor Kail, PA.  I have reviewed the chart, notes and new data.  I agree with PA/NP's note.  Key new complaints: no further chest pain since transferring upstairs; nitrate headache was intolerable. Key examination changes: obesity, normal CV exam Key new findings / data: normal cardiac enzymes  PLAN: Coronary CT angio today.  Sanda Klein, MD, [place] [phone] 05/05/2017, 10:46 AM

## 2017-05-05 NOTE — Progress Notes (Signed)
  Echocardiogram 2D Echocardiogram has been performed.  Cynthia [name] 05/05/2017, 12:33 PM

## 2017-05-05 NOTE — Discharge Summary (Signed)
Discharge Summary    Patient ID: Cynthia [name],  MRN: [id number], DOB/AGE: 02/13/1965 52 y.o.  Admit date: 05/04/2017 Discharge date: 05/05/2017  Primary Care Provider: Josetta Huddle Primary Cardiologist: Dr. Acie Fredrickson  Discharge Diagnoses    Active Problems:   Precordial chest pain   Elevated D-dimer    GERD   HLD  Allergies Allergies  Allergen Reactions  . Amoxicillin Other (See Comments)    Face, eyes, chest turned red  . Ampicillin Other (See Comments)    SERUM SICKNESS REACTION  . Apple Fruit Extract Itching    Mouth itches inside, but no breathing impairment; treats with Benadryl  . Cherry Extract Itching    Mouth itches inside, but no breathing impairment; treats with Benadryl  . Fruit & Vegetable Daily [Nutritional Supplements] Itching    Mouth itches inside, but no breathing impairment; treats with Benadryl  . Imitrex [Sumatriptan] Other (See Comments)    Chest Pain  . Other Itching    Feline dander = Mouth itches inside, but no breathing impairment; treats with Benadryl  . Peach [Prunus Persica] Itching    Mouth itches inside, but no breathing impairment; treats with Benadryl  . Prednisone Other (See Comments)    Eye Irritation  . Strawberry Extract Itching    Mouth itches inside, but no breathing impairment; treats with Benadryl  . Zoloft [Sertraline Hcl] Other (See Comments)    Causes hyperactivity    Diagnostic Studies/Procedures    CT CORONARY MORPH W/CTA COR W/SCORE W/CA W/CM &/OR WO/CM  IMPRESSION: 1. Coronary calcium score of 0. This was 0 percentile for age and sex matched control.  2. Normal coronary origin with right dominance.  3. No evidence of CAD.  Echocardiogram Study Conclusions  - Left ventricle: The cavity size was normal. Systolic function was   normal. The estimated ejection fraction was in the range of 55%   to 60%. Wall motion was normal; there were no regional wall   motion abnormalities. Left ventricular diastolic  function   parameters were normal. - Atrial septum: No defect or patent foramen ovale was identified.  Left ventricle:  The cavity size was normal. Systolic function was normal. The estimated ejection fraction was in the range of 55% to 60%. Wall motion was normal; there were no regional wall motion abnormalities. The transmitral flow pattern was normal. The deceleration time of the early transmitral flow velocity was normal. The pulmonary vein flow pattern was normal. The tissue Doppler parameters were normal. Left ventricular diastolic function parameters were normal.  ------------------------------------------------------------------- Aortic valve:   Trileaflet; mildly thickened, mildly calcified leaflets. Mobility was not restricted.  Doppler:  Transvalvular velocity was within the normal range. There was no stenosis. There was no regurgitation.  ------------------------------------------------------------------- Aorta:  The aorta was normal, not dilated, and non-diseased. Aortic root: The aortic root was normal in size.  ------------------------------------------------------------------- Mitral valve:   Mildly thickened leaflets . Mobility was not restricted.  Doppler:  Transvalvular velocity was within the normal range. There was no evidence for stenosis. There was trivial regurgitation.    Valve area by pressure half-time: 4.23 cm^2. Indexed valve area by pressure half-time: 1.87 cm^2/m^2.    Peak gradient (D): 3 mm Hg.  ------------------------------------------------------------------- Left atrium:  The atrium was normal in size.  ------------------------------------------------------------------- Atrial septum:  No defect or patent foramen ovale was identified.   ------------------------------------------------------------------- Right ventricle:  The cavity size was normal. Wall thickness was normal. Systolic function was  normal.  ------------------------------------------------------------------- Pulmonic valve:  Doppler:  Transvalvular velocity was within the normal range. There was no evidence for stenosis. There was mild regurgitation.  ------------------------------------------------------------------- Tricuspid valve:   Structurally normal valve.    Doppler: Transvalvular velocity was within the normal range. There was mild regurgitation.  ------------------------------------------------------------------- Pulmonary artery:   The main pulmonary artery was normal-sized. Systolic pressure was within the normal range.  ------------------------------------------------------------------- Right atrium:  The atrium was normal in size.  ------------------------------------------------------------------- Pericardium:  The pericardium was normal in appearance. There was no pericardial effusion.  ------------------------------------------------------------------- Systemic veins: Inferior vena cava: The vessel was normal in size. The respirophasic diameter changes were in the normal range (>= 50%), consistent with normal central venous pressure.  ------------------------------------------------------------------- Post procedure conclusions Ascending Aorta:  - The aorta was normal, not dilated, and non-diseased.  ------------------------------------------------------------------- Measurements   Left ventricle                          Value          Reference  LV ID, ED, PLAX chordal         (L)     39    mm       43 - 52  LV ID, ES, PLAX chordal                 28    mm       23 - 38  LV fx shortening, PLAX chordal  (L)     28    %        >=29  LV PW thickness, ED                     12    mm       ----------  IVS/LV PW ratio, ED                     0.75           <=1.3  LV e&', lateral                          7.72  cm/s     ----------  LV E/e&', lateral                        11.5            ----------  LV e&', medial                           6.85  cm/s     ----------  LV E/e&', medial                         12.96          ----------  LV e&', average                          7.29  cm/s     ----------  LV E/e&', average                        12.19          ----------    Ventricular septum  Value          Reference  IVS thickness, ED                       9     mm       ----------    LVOT                                    Value          Reference  LVOT ID, S                              18    mm       ----------  LVOT area                               2.54  cm^2     ----------    Aorta                                   Value          Reference  Aortic root ID, ED                      32    mm       ----------  Ascending aorta ID, A-P, S              26    mm       ----------    Left atrium                             Value          Reference  LA ID, A-P, ES                          34    mm       ----------  LA ID/bsa, A-P                          1.51  cm/m^2   <=2.2  LA volume, S                            43.6  ml       ----------  LA volume/bsa, S                        19.3  ml/m^2   ----------  LA volume, ES, 1-p A4C                  38.3  ml       ----------  LA volume/bsa, ES, 1-p A4C              17    ml/m^2   ----------  LA volume, ES, 1-p A2C                  46.1  ml       ----------  LA volume/bsa, ES, 1-p A2C  20.4  ml/m^2   ----------    Mitral valve                            Value          Reference  Mitral E-wave peak velocity             88.8  cm/s     ----------  Mitral A-wave peak velocity             60.6  cm/s     ----------  Mitral deceleration time                176   ms       150 - 230  Mitral pressure half-time               52    ms       ----------  Mitral peak gradient, D                 3     mm Hg    ----------  Mitral E/A ratio, peak                  1.5            ----------  Mitral valve  area, PHT, DP              4.23  cm^2     ----------  Mitral valve area/bsa, PHT, DP          1.87  cm^2/m^2 ----------    Right atrium                            Value          Reference  RA ID, S-I, ES, A4C             (H)     50.4  mm       34 - 49  RA area, ES, A4C                        11.8  cm^2     8.3 - 19.5  RA volume, ES, A/L                      22.3  ml       ----------  RA volume/bsa, ES, A/L                  9.9   ml/m^2   ----------    Systemic veins                          Value          Reference  Estimated CVP                           3     mm Hg    ----------    Right ventricle                         Value          Reference  RV ID, minor axis, ED, A4C base         28    mm       ----------  TAPSE                                   18.9  mm       ----------  RV s&', lateral, S                       12.2  cm/s     ----------  Legend: (L)  and  (H)  mark values outside specified reference range.  History of Present Illness     52 yo female with PMH of HL, depression, chronic diastolic HF, hypothyroidism, and pericarditis who presented with chest pain for the past couple of weeks.   She was seen by Dr. Acie Fredrickson back in 2012 and had a cath with normal coronaries. Post cath she developed significant pleuritic chest pain and was placed on Indocin with improvement. States there was talk of sending her to [facility] for a biopsy but her symptoms resolved and this was deferred. She was placed on metoprolol 100mg  BID but was able to titrated down. Had been on Seroquel for depression which caused her LDL to rise. Last check per her report was 200. Has been on Crestor 5mg  3 times a week. Currently working to wean the Seroquel.   Reports since back in the middle of Feb she began having centralized chest pressure/pain. Does not feel that it is necessarily exertional in nature. When she develops an episode typically takes ASA with improvement. Has had several episodes with radiation into  the left arm and jaw.  Over the past couple of days she symptoms seem to have been worse. In morning of 05/04/17 her chest pain worsened again with radiation into the left arm. Called her PCP and went to the office with symptoms. Office called EMS and she was brought directly to the ED.   In the ED her EKG showed SR with no acute ischemia. Was given a SL nitro en route which resolved her arm pain. Still with 5/10 chest pain on exam. Does have hx of hernia and GERD as well.   Hospital Course     Consultants: None  1. Chest pain - Troponin has been remained negative. EKG without acute ischemic changes. TSH normal. She ruled out. No recurrent pain. She has drove to [place] about week ago however her symptoms started before this. D-dimer of 0.6. No further worse up recommended given normal RV by echo. CT coronary without evidence of CAD. Calcium score is 0. She has hx of GERD. Recommended to take OTC Nexium and follow up with PCP and GI for non cardiac evaluation of chest pain.   2. 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 No syncope. Her BP stable. Will DC metoprolol.   Cardiology follow up PRN.   The patient has been seen by Dr. Sallyanne Kuster  today and deemed ready for discharge home. All follow-up appointments have been scheduled. Discharge medications are listed below.    Discharge Vitals Blood pressure 104/62, pulse 66, temperature 98.7 F (37.1 C), temperature source Oral, resp. rate 10, height 5\' 8"  (1.727 m), weight 226 lb 6.4 oz (102.7 kg), SpO2 98 %.  Filed Weights   05/05/17 0453  Weight: 226 lb 6.4 oz (102.7 kg)    Labs & Radiologic Studies     CBC Recent Labs    05/04/17  1657 05/04/17 2055  WBC 8.9 7.4  NEUTROABS 5.7  --   HGB 13.9 13.4  HCT 42.9 40.8  MCV 94.5 94.2  PLT 221 97*   Basic Metabolic Panel Recent Labs    05/04/17 1657 05/04/17 2055 05/05/17 0602  NA 139  --   138  K 3.9  --  3.5  CL 107  --  107  CO2 21*  --  21*  GLUCOSE 77  --  106*  BUN 10  --  10  CREATININE 0.79 0.81 0.90  CALCIUM 9.1  --  8.7*   Cardiac Enzymes Recent Labs    05/04/17 1657 05/04/17 2243 05/05/17 0602  TROPONINI <0.03 <0.03 <0.03   BNP Invalid input(s): POCBNP D-Dimer Recent Labs    05/04/17 1822  DDIMER 0.60*   Hemoglobin A1C Recent Labs    05/05/17 0602  HGBA1C 5.6   Fasting Lipid Panel Recent Labs    05/05/17 0602  CHOL 164  HDL 25*  LDLCALC 119*  TRIG 101  CHOLHDL 6.6   Thyroid Function Tests Recent Labs    05/05/17 0602  TSH 0.744    Dg Chest 2 View  Result Date: 05/04/2017 CLINICAL DATA:  Chest pain and shortness of breath. EXAM: CHEST - 2 VIEW COMPARISON:  04/27/2013 FINDINGS: Examination is degraded due to patient body habitus. Grossly unchanged cardiac silhouette and mediastinal contours. No focal airspace opacities. No pleural effusion or pneumothorax. No evidence of edema. No acute osseus abnormalities. Stigmata of DISH within the lower thoracic spine. Post cholecystectomy. IMPRESSION: No acute cardiopulmonary disease. Electronically Signed   By: Sandi Mariscal M.D.   On: 05/04/2017 18:04   Ct Coronary Morph W/cta Cor W/score W/ca W/cm &/or Wo/cm  Result Date: 05/05/2017 CLINICAL DATA:  52 year old female with PMH of HL, depression, chronic diastolic HF, hypothyroidism, and pericarditis who presented with chest pain for the past couple of weeks. EXAM: Cardiac/Coronary  CT TECHNIQUE: The patient was scanned on a [employer]. FINDINGS: A 120 kV prospective scan was triggered in the descending thoracic aorta at 111 HU's. Axial non-contrast 3 mm slices were carried out through the heart. The data set was analyzed on a dedicated work station and scored using the [employer]. Gantry rotation speed was 250 msecs and collimation was .6 mm. 10 mg of iv Metoprolol and 0.8 mg of sl NTG was given. The 3D data set was reconstructed in 5%  intervals of the 67-82 % of the R-R cycle. Diastolic phases were analyzed on a dedicated work station using MPR, MIP and VRT modes. The patient received 80 cc of contrast. Aorta:  Normal size.  No calcifications.  No dissection. Aortic Valve:  Trileaflet.  No calcifications. Coronary Arteries:  Normal coronary origin.  Right dominance. RCA is a large dominant artery that gives rise to PDA and PLVB. There is no plaque. Left main is a large artery that gives rise to LAD, ramus intermedius and LCX arteries. Left main has no plaque. LAD is a large vessel that wraps around the apex and has no plaque. RI is a small artery that has no plaque. LCX is a non-dominant artery that gives rise to one large OM1 branch. There is no plaque. Other findings: Normal pulmonary vein drainage into the left atrium. Normal let atrial appendage without a thrombus. Normal size of the pulmonary artery. IMPRESSION: 1. Coronary calcium score of 0. This was 0 percentile for age and sex matched control. 2. Normal coronary origin with right dominance. 3. No  evidence of CAD. Electronically Signed   By: Ena Dawley   On: 05/05/2017 11:31    Disposition   Pt is being discharged home today in good condition.  Follow-up Plans & Appointments    Follow-up Information    Josetta Huddle, MD Follow up in 3 day(s).   Specialty:  Internal Medicine Why:  for hospital follow up Contact information: [address] [place] [address] [phone]          Discharge Instructions    Diet - low sodium heart healthy   Complete by:  As directed    Discharge instructions   Complete by:  As directed    Follow up with PCP and GI for non cardiac chest pain and  post hospital follow up.   Increase activity slowly   Complete by:  As directed       Discharge Medications   Allergies as of 05/05/2017      Reactions   Amoxicillin Other (See Comments)   Face, eyes, chest turned red   Ampicillin Other (See Comments)    SERUM SICKNESS REACTION   Apple Fruit Extract Itching   Mouth itches inside, but no breathing impairment; treats with Benadryl   Cherry Extract Itching   Mouth itches inside, but no breathing impairment; treats with Benadryl   Fruit & Vegetable Daily [nutritional Supplements] Itching   Mouth itches inside, but no breathing impairment; treats with Benadryl   Imitrex [sumatriptan] Other (See Comments)   Chest Pain   Other Itching   Feline dander = Mouth itches inside, but no breathing impairment; treats with Benadryl   Peach [prunus Persica] Itching   Mouth itches inside, but no breathing impairment; treats with Benadryl   Prednisone Other (See Comments)   Eye Irritation   Strawberry Extract Itching   Mouth itches inside, but no breathing impairment; treats with Benadryl   Zoloft [sertraline Hcl] Other (See Comments)   Causes hyperactivity      Medication List    STOP taking these medications   metoprolol tartrate 25 MG tablet Commonly known as:  LOPRESSOR     TAKE these medications   ALPRAZolam 1 MG tablet Commonly known as:  XANAX Take 0.5 mg by mouth at bedtime as needed for anxiety or sleep.   clindamycin 1 % gel Commonly known as:  CLINDAGEL Apply 1 application topically 2 (two) times daily as needed (for acne).   esomeprazole 20 MG capsule Commonly known as:  NEXIUM Take 1 capsule (20 mg total) by mouth daily.   estrogens-methylTEST 1.25-2.5 MG tablet Commonly known as:  ESTRATEST Take 1 tablet by mouth daily.   lamoTRIgine 150 MG tablet Commonly known as:  LAMICTAL Take 150 mg by mouth at bedtime.   levothyroxine 75 MCG tablet Commonly known as:  SYNTHROID, LEVOTHROID Take 75 mcg by mouth at bedtime.   methocarbamol 500 MG tablet Commonly known as:  ROBAXIN Take 1-2 tablets by mouth 3 (three) times daily as needed (FOR COLON SPASMS).   metroNIDAZOLE 0.75 % gel Commonly known as:  METROGEL Apply to face once a day as needed/as directed   ondansetron 8  MG disintegrating tablet Commonly known as:  ZOFRAN ODT Take 1 tab every 6 hours as needed for nausea. What changed:    how much to take  how to take this  when to take this  reasons to take this  additional instructions   QUEtiapine 200 MG 24 hr tablet Commonly known as:  SEROQUEL XR Take  200 mg by mouth at bedtime.   rizatriptan 10 MG tablet Commonly known as:  MAXALT Take 10 mg by mouth daily as needed for migraine. May repeat in 2 hours if needed   rosuvastatin 40 MG tablet Commonly known as:  CRESTOR Take 40 mg by mouth every Monday, Wednesday, and Friday at 8 PM.   zolpidem 10 MG tablet Commonly known as:  AMBIEN Take 10 mg by mouth at bedtime as needed for sleep.   zonisamide 100 MG capsule Commonly known as:  ZONEGRAN Take 200 mg by mouth at bedtime          Outstanding Labs/Studies   None  Duration of Discharge Encounter   Greater than 30 minutes including physician time.  Signed, Crista Luria Tisha Cline PA-C 05/05/2017, 1:34 PM

## 2017-05-05 NOTE — Progress Notes (Signed)
Pt discharged per MD order, all discharge instructions reviewed and all questions answered.  

## 2017-06-05 ENCOUNTER — Encounter: Payer: Self-pay | Admitting: Internal Medicine

## 2017-06-05 ENCOUNTER — Ambulatory Visit: Payer: 59 | Admitting: Internal Medicine

## 2017-06-05 VITALS — BP 118/68 | HR 95 | Ht 68.0 in | Wt 232.0 lb

## 2017-06-05 DIAGNOSIS — R933 Abnormal findings on diagnostic imaging of other parts of digestive tract: Secondary | ICD-10-CM | POA: Diagnosis not present

## 2017-06-05 DIAGNOSIS — Z8719 Personal history of other diseases of the digestive system: Secondary | ICD-10-CM

## 2017-06-05 DIAGNOSIS — K5901 Slow transit constipation: Secondary | ICD-10-CM | POA: Diagnosis not present

## 2017-06-05 DIAGNOSIS — R109 Unspecified abdominal pain: Secondary | ICD-10-CM

## 2017-06-05 DIAGNOSIS — R140 Abdominal distension (gaseous): Secondary | ICD-10-CM

## 2017-06-05 DIAGNOSIS — K5900 Constipation, unspecified: Secondary | ICD-10-CM

## 2017-06-05 NOTE — Progress Notes (Signed)
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 She is sent today by Dr. Hassell Done regarding problems with abdominal pain in anticipation of possible surgery. Patient has a history of chronic abdominal pain. Also history of documented small bowel obstruction for which she subsequently underwent surgery including lysis of adhesions and appendectomy. The patient was last seen in this office 10/09/2016 regarding an abnormal CT scan suggesting proctitis. This after her previous surgery. No evidence for ventral hernia at that time. She does have a history of adenomatous colon polyps and we proceeded with complete colonoscopy 12/18/2016. Examination revealed a diminutive sigmoid colon polyp and nonbleeding internal hemorrhoids. Otherwise normal exam. The polyp was adenomatous. Follow-up in 5 years recommended. The patient tells me that she has continued with intermittent right-sided pain associated with nausea. No vomiting. No interval ER visits. She will apply direct pressure or heating pad to obtain some relief. She is concerned may represent impending obstruction. Bowel habits tend to be on the constipated side for which she had been using MiraLAX. She also has bloating discomfort. Separately, she reports midline incision discomfort felt possibly a ventral hernia. No imaging since last year. I did review Dr. Earlie Server office note 04/15/2017. Prior to that visit she underwent barium enema which revealed a redundant large intestine. She was prescribed Robaxin for muscle spasm and Zofran for nausea. Robaxin did not seem to help. Other considerations were colectomy and ventral hernia repair without mesh.  REVIEW OF SYSTEMS:  All non-GI ROS negative unless otherwise stated in the history of present illness   Past Medical History:  Diagnosis Date  . Allergy   . Anxiety   . Bladder disorder   . Colitis 2006  . Colon polyps    . Complication of anesthesia    problems waking up  . Diastolic congestive heart failure ([place])    with pericarditis- no issues last 5 years 09-2015  . Diverticulosis   . History of depression   . History of migraines   . Hyperlipemia   . Hypothyroidism   . Kidney stone   . Paroxysmal ventricular tachycardia ([place])   . Pericarditis   . Rapid heart rate    maybe once a month since pericarditis  . Rosacea   . SBO (small bowel obstruction) ([place]) 08/16/2015  . Vitamin D deficiency     Past Surgical History:  Procedure Laterality Date  . APPENDECTOMY    . CARDIAC CATHETERIZATION  11/15/08   SMOOTH AND NORMAL  . CHOLECYSTECTOMY    . COLONOSCOPY    . LAPAROSCOPY    . LAPAROSCOPY N/A 10/26/2015   Procedure: diagnostic LAPAROSCOPY LAPAROTOMY AND APPENDECTOMY;  Surgeon: Johnathan Hausen, MD;  Location: [facility];  Service: General;  Laterality: N/A;  . LAPAROTOMY  2017  . POLYPECTOMY  02/2009  . SHOULDER SURGERY     left  . TOTAL ABDOMINAL HYSTERECTOMY W/ BILATERAL SALPINGOOPHORECTOMY    . UPPER GASTROINTESTINAL ENDOSCOPY      Social History Cynthia [name]  reports that she has never smoked. She has never used smokeless tobacco. She reports that she does not drink alcohol or use drugs.  family history is not on file. She was adopted.  Allergies  Allergen Reactions  . Amoxicillin Other (See Comments)    Face, eyes, chest turned red  . Ampicillin Other (See Comments)    SERUM SICKNESS REACTION  . Apple Fruit Extract Itching    Mouth  itches inside, but no breathing impairment; treats with Benadryl  . Cherry Extract Itching    Mouth itches inside, but no breathing impairment; treats with Benadryl  . Fruit & Vegetable Daily [Nutritional Supplements] Itching    Mouth itches inside, but no breathing impairment; treats with Benadryl  . Imitrex [Sumatriptan] Other (See Comments)    Chest Pain  . Other Itching    Feline dander = Mouth itches inside, but no breathing impairment; treats with  Benadryl  . Peach [Prunus Persica] Itching    Mouth itches inside, but no breathing impairment; treats with Benadryl  . Prednisone Other (See Comments)    Eye Irritation  . Strawberry Extract Itching    Mouth itches inside, but no breathing impairment; treats with Benadryl  . Zoloft [Sertraline Hcl] Other (See Comments)    Causes hyperactivity       PHYSICAL EXAMINATION: Vital signs: BP 118/68   Pulse 95   Ht 5\' 8"  (1.727 m)   Wt 232 lb (105.2 kg)   BMI 35.28 kg/m   Constitutional: generally well-appearing, no acute distress Psychiatric: alert and oriented x3, cooperative Eyes: extraocular movements intact, anicteric, conjunctiva pink Mouth: oral pharynx moist, no lesions Neck: supple no lymphadenopathy Cardiovascular: heart regular rate and rhythm, no murmur Lungs: clear to auscultation bilaterally Abdomen: soft, some tenderness above the umbilicus in the midline with suggestion of incisional hernia, nondistended, no obvious ascites, no peritoneal signs, normal bowel sounds, no organomegaly Rectal: omitted Extremities: no clubbing, cyanosis, or lower extremity edema bilaterally Skin: no lesions on visible extremities Neuro: No focal deficits. Cranial nerves intact  ASSESSMENT:  #1. Chronic intermittent right-sided pain with nausea. Possibly adhesions. Possibly subacute small bowel obstruction. Possibly flaccid colon. Possibly functional #2. Documented small bowel obstruction with subsequent surgery #3. Possible interval ventral hernia development #4. Constipation with bloating. #5. History of advanced adenomatous polyps. Last examination November 2018 with not advanced adenoma   PLAN:  #1. Trial of Linzess. She was provided with samples of all 3 dosages (72 g, 145 g, 290 g) to try. This may not relieve all of her discomfort but may help with bloating and constipation #2. Proceed with contrast-enhanced CT scan of the abdomen and pelvis to assess for true ventral  hernia #3. Surveillance colonoscopy around November 2023 #4. I asked the patient to contact the office in about 2 weeks to give [name] follow-up #5. Return to Dr. Hassell Done has planned  A copy of this consultation note has been sent to Dr. Hassell Done

## 2017-06-05 NOTE — Patient Instructions (Signed)
You have been scheduled for a CT scan of the abdomen and pelvis at [facility] ([address]---this is in the same building as [facility]).   You are scheduled on 06/12/2017 at 3:30pm. You should arrive 15 minutes prior to your appointment time for registration. Please follow the written instructions below on the day of your exam:  WARNING: IF YOU ARE ALLERGIC TO IODINE/X-RAY DYE, PLEASE NOTIFY RADIOLOGY IMMEDIATELY AT [phone]! YOU WILL BE GIVEN A 13 HOUR PREMEDICATION PREP.  1) Do not eat or drink anything after 11:30am (4 hours prior to your test) 2) You have been given 2 bottles of oral contrast to drink. The solution may taste               better if refrigerated, but do NOT add ice or any other liquid to this solution. Shake well before drinking.    Drink 1 bottle of contrast @ 1:30pm (2 hours prior to your exam)  Drink 1 bottle of contrast @ 2:30pm (1 hour prior to your exam)  You may take any medications as prescribed with a small amount of water except for the following: Metformin, Glucophage, Glucovance, Avandamet, Riomet, Fortamet, Actoplus Met, Janumet, Glumetza or Metaglip. The above medications must be held the day of the exam AND 48 hours after the exam.  The purpose of you drinking the oral contrast is to aid in the visualization of your intestinal tract. The contrast solution may cause some diarrhea. Before your exam is started, you will be given a small amount of fluid to drink. Depending on your individual set of symptoms, you may also receive an intravenous injection of x-ray contrast/dye. Plan on being at [facility] for 30 minutes or long, depending on the type of exam you are having performed.  If you have any questions regarding your exam or if you need to reschedule, you may call the CT department at [phone] between the hours of 8:00 am and 5:00 pm,  Monday-Friday.  _________________________________________________________________  Dennis Bast have been given samples of several strengths of Linzess.  Call back if one works well and we will send in a prescription.

## 2017-06-12 ENCOUNTER — Ambulatory Visit (INDEPENDENT_AMBULATORY_CARE_PROVIDER_SITE_OTHER)
Admission: RE | Admit: 2017-06-12 | Discharge: 2017-06-12 | Disposition: A | Discharge status: Home / Self Care | Payer: 59 | Source: Ambulatory Visit | Attending: Internal Medicine | Admitting: Internal Medicine

## 2017-06-12 DIAGNOSIS — R109 Unspecified abdominal pain: Secondary | ICD-10-CM | POA: Diagnosis not present

## 2017-06-12 MED ORDER — IOPAMIDOL (ISOVUE-300) INJECTION 61%
100.0000 mL | Freq: Once | INTRAVENOUS | Status: AC | PRN
  Administered 2017-06-12: 100 mL via INTRAVENOUS

## 2017-08-12 NOTE — Patient Instructions (Signed)
Cynthia [name]  08/12/2017   Your procedure is scheduled on: 08-14-17   Report to [facility] Main  Entrance    Report to admitting at 5:30AM    Call this number if you have problems the morning of surgery [phone]     Remember: NO SOLID FOOD AFTER MIDNIGHT THE NIGHT PRIOR TO SURGERY. NOTHING BY MOUTH EXCEPT CLEAR LIQUIDS UNTIL 3 HOURS PRIOR TO [place] SURGERY. PLEASE FINISH ENSURE DRINK PER SURGEON ORDER 3 HOURS PRIOR TO SCHEDULED SURGERY TIME WHICH NEEDS TO BE COMPLETED AT ____4:30AM_____.   CLEAR LIQUID DIET   Foods Allowed                                                                     Foods Excluded  Coffee and tea, regular and decaf                             liquids that you cannot  Plain Jell-O in any flavor                                             see through such as: Fruit ices (not with fruit pulp)                                     milk, soups, orange juice  Iced Popsicles                                    All solid food Carbonated beverages, regular and diet                                    Cranberry, grape and apple juices Sports drinks like Gatorade Lightly seasoned clear broth or consume(fat free) Sugar, honey syrup  Sample Menu Breakfast                                Lunch                                     Supper Cranberry juice                    Beef broth                            Chicken broth Jell-O                                     Grape juice  Apple juice Coffee or tea                        Jell-O                                      Popsicle                                                Coffee or tea                        Coffee or tea  _____________________________________________________________________       Take these medicines the morning of surgery with A SIP OF WATER: TYLENOL IF NEEDED, XANAX IF NEEDED                                You may not have any metal on your  body including hair pins and              piercings  Do not wear jewelry, make-up, lotions, powders or perfumes, deodorant             Do not wear nail polish.  Do not shave  48 hours prior to surgery.     Do not bring valuables to the hospital. [place].  Contacts, dentures or bridgework may not be worn into surgery.  Leave suitcase in the car. After surgery it may be brought to your room.                   Please read over the following fact sheets you were given: _____________________________________________________________________             [facility] - Preparing for Surgery Before surgery, you can play an important role.  Because skin is not sterile, your skin needs to be as free of germs as possible.  You can reduce the number of germs on your skin by washing with CHG (chlorahexidine gluconate) soap before surgery.  CHG is an antiseptic cleaner which kills germs and bonds with the skin to continue killing germs even after washing. Please DO NOT use if you have an allergy to CHG or antibacterial soaps.  If your skin becomes reddened/irritated stop using the CHG and inform your nurse when you arrive at Short Stay. Do not shave (including legs and underarms) for at least 48 hours prior to the first CHG shower.  You may shave your face/neck. Please follow these instructions carefully:  1.  Shower with CHG Soap the night before surgery and the  morning of Surgery.  2.  If you choose to wash your hair, wash your hair first as usual with your  normal  shampoo.  3.  After you shampoo, rinse your hair and body thoroughly to remove the  shampoo.                           4.  Use CHG as you would any other liquid soap.  You can apply chg directly  to the skin and wash                       Gently with a scrungie or clean washcloth.  5.  Apply the CHG Soap to your body ONLY FROM THE NECK DOWN.   Do not use on face/ open                            Wound or open sores. Avoid contact with eyes, ears mouth and genitals (private parts).                       Wash face,  Genitals (private parts) with your normal soap.             6.  Wash thoroughly, paying special attention to the area where your surgery  will be performed.  7.  Thoroughly rinse your body with warm water from the neck down.  8.  DO NOT shower/wash with your normal soap after using and rinsing off  the CHG Soap.                9.  Pat yourself dry with a clean towel.            10.  Wear clean pajamas.            11.  Place clean sheets on your bed the night of your first shower and do not  sleep with pets. Day of Surgery : Do not apply any lotions/deodorants the morning of surgery.  Please wear clean clothes to the hospital/surgery center.  FAILURE TO FOLLOW THESE INSTRUCTIONS MAY RESULT IN THE CANCELLATION OF YOUR SURGERY PATIENT SIGNATURE_________________________________  NURSE SIGNATURE__________________________________  ________________________________________________________________________

## 2017-08-12 NOTE — Progress Notes (Signed)
EKG 05-04-17 Epic   CT CORONARY 05-05-17 Epic   CXR 05-04-17 Epic   ECHO 05-05-17 Epic

## 2017-08-13 ENCOUNTER — Encounter (HOSPITAL_COMMUNITY): Payer: Self-pay

## 2017-08-13 ENCOUNTER — Other Ambulatory Visit: Payer: Self-pay

## 2017-08-13 ENCOUNTER — Encounter (HOSPITAL_COMMUNITY)
Admission: RE | Admit: 2017-08-13 | Discharge: 2017-08-13 | Disposition: A | Discharge status: Home / Self Care | Payer: 59 | Source: Ambulatory Visit | Attending: Surgery | Admitting: Surgery

## 2017-08-13 LAB — BASIC METABOLIC PANEL
ANION GAP: 9 (ref 5–15)
BUN: 13 mg/dL (ref 6–20)
CHLORIDE: 103 mmol/L (ref 98–111)
CO2: 25 mmol/L (ref 22–32)
Calcium: 8.8 mg/dL — ABNORMAL LOW (ref 8.9–10.3)
Creatinine, Ser: 0.98 mg/dL (ref 0.44–1.00)
GFR calc non Af Amer: >60 mL/min (ref >60)
Glucose, Bld: 215 mg/dL — ABNORMAL HIGH (ref 70–99)
POTASSIUM: 3.7 mmol/L (ref 3.5–5.1)
SODIUM: 137 mmol/L (ref 135–145)

## 2017-08-13 LAB — CBC
HCT: 40.7 % (ref 36.0–46.0)
Hemoglobin: 13.1 g/dL (ref 12.0–15.0)
MCH: 30.1 pg (ref 26.0–34.0)
MCHC: 32.2 g/dL (ref 30.0–36.0)
MCV: 93.6 fL (ref 78.0–100.0)
PLATELETS: 241 10*3/uL (ref 150–400)
RBC: 4.35 MIL/uL (ref 3.87–5.11)
RDW: 13.8 % (ref 11.5–15.5)
WBC: 6.6 10*3/uL (ref 4.0–10.5)

## 2017-08-13 MED ORDER — BUPIVACAINE LIPOSOME 1.3 % IJ SUSP
20.0000 mL | Freq: Once | INTRAMUSCULAR | Status: DC
  Filled 2017-08-13: qty 20

## 2017-08-14 ENCOUNTER — Ambulatory Visit (HOSPITAL_COMMUNITY): Payer: 59 | Admitting: Certified Registered Nurse Anesthetist

## 2017-08-14 ENCOUNTER — Inpatient Hospital Stay (HOSPITAL_COMMUNITY)
Admission: AD | Admit: 2017-08-14 | Discharge: 2017-08-18 | DRG: 330 | Disposition: A | Discharge status: Home / Self Care | Payer: 59 | Source: Ambulatory Visit | Attending: Surgery | Admitting: Surgery

## 2017-08-14 ENCOUNTER — Encounter (HOSPITAL_COMMUNITY): Payer: Self-pay | Admitting: Emergency Medicine

## 2017-08-14 ENCOUNTER — Encounter (HOSPITAL_COMMUNITY)
Admission: AD | Disposition: A | Discharge status: Home / Self Care | Payer: Self-pay | Source: Ambulatory Visit | Attending: Surgery

## 2017-08-14 DIAGNOSIS — E785 Hyperlipidemia, unspecified: Secondary | ICD-10-CM | POA: Diagnosis present

## 2017-08-14 DIAGNOSIS — Z8719 Personal history of other diseases of the digestive system: Secondary | ICD-10-CM

## 2017-08-14 DIAGNOSIS — I5032 Chronic diastolic (congestive) heart failure: Secondary | ICD-10-CM | POA: Diagnosis present

## 2017-08-14 DIAGNOSIS — Z6836 Body mass index (BMI) 36.0-36.9, adult: Secondary | ICD-10-CM | POA: Diagnosis not present

## 2017-08-14 DIAGNOSIS — K430 Incisional hernia with obstruction, without gangrene: Secondary | ICD-10-CM | POA: Diagnosis present

## 2017-08-14 DIAGNOSIS — Z91018 Allergy to other foods: Secondary | ICD-10-CM | POA: Diagnosis not present

## 2017-08-14 DIAGNOSIS — E669 Obesity, unspecified: Secondary | ICD-10-CM | POA: Diagnosis present

## 2017-08-14 DIAGNOSIS — Z888 Allergy status to other drugs, medicaments and biological substances status: Secondary | ICD-10-CM | POA: Diagnosis not present

## 2017-08-14 DIAGNOSIS — F419 Anxiety disorder, unspecified: Secondary | ICD-10-CM | POA: Diagnosis present

## 2017-08-14 DIAGNOSIS — K567 Ileus, unspecified: Secondary | ICD-10-CM | POA: Diagnosis not present

## 2017-08-14 DIAGNOSIS — Z881 Allergy status to other antibiotic agents status: Secondary | ICD-10-CM | POA: Diagnosis not present

## 2017-08-14 DIAGNOSIS — R1115 Cyclical vomiting syndrome unrelated to migraine: Secondary | ICD-10-CM | POA: Diagnosis present

## 2017-08-14 DIAGNOSIS — K598 Other specified functional intestinal disorders: Secondary | ICD-10-CM | POA: Diagnosis present

## 2017-08-14 DIAGNOSIS — G43A1 Cyclical vomiting, intractable: Secondary | ICD-10-CM | POA: Diagnosis present

## 2017-08-14 DIAGNOSIS — Z9889 Other specified postprocedural states: Secondary | ICD-10-CM

## 2017-08-14 DIAGNOSIS — Z8669 Personal history of other diseases of the nervous system and sense organs: Secondary | ICD-10-CM

## 2017-08-14 DIAGNOSIS — R339 Retention of urine, unspecified: Secondary | ICD-10-CM | POA: Diagnosis not present

## 2017-08-14 DIAGNOSIS — L719 Rosacea, unspecified: Secondary | ICD-10-CM | POA: Diagnosis present

## 2017-08-14 DIAGNOSIS — E039 Hypothyroidism, unspecified: Secondary | ICD-10-CM | POA: Diagnosis present

## 2017-08-14 DIAGNOSIS — Z880 Allergy status to penicillin: Secondary | ICD-10-CM | POA: Diagnosis not present

## 2017-08-14 DIAGNOSIS — K562 Volvulus: Secondary | ICD-10-CM | POA: Diagnosis present

## 2017-08-14 HISTORY — PX: LAPAROSCOPIC ASSISTED VENTRAL HERNIA REPAIR: SHX6312

## 2017-08-14 LAB — CBC
HCT: 40.8 % (ref 36.0–46.0)
Hemoglobin: 13.4 g/dL (ref 12.0–15.0)
MCH: 30.7 pg (ref 26.0–34.0)
MCHC: 32.8 g/dL (ref 30.0–36.0)
MCV: 93.6 fL (ref 78.0–100.0)
PLATELETS: 234 10*3/uL (ref 150–400)
RBC: 4.36 MIL/uL (ref 3.87–5.11)
RDW: 13.8 % (ref 11.5–15.5)
WBC: 15.2 10*3/uL — ABNORMAL HIGH (ref 4.0–10.5)

## 2017-08-14 LAB — CREATININE, SERUM
Creatinine, Ser: 0.85 mg/dL (ref 0.44–1.00)
GFR calc Af Amer: >60 mL/min (ref >60)
GFR calc non Af Amer: >60 mL/min (ref >60)

## 2017-08-14 SURGERY — REPAIR, HERNIA, VENTRAL, LAPAROSCOPY-ASSISTED
Anesthesia: General

## 2017-08-14 MED ORDER — ALPRAZOLAM 1 MG PO TABS: 1.0000 mg | ORAL_TABLET | Freq: Three times a day (TID) | ORAL | Status: DC | PRN

## 2017-08-14 MED ORDER — METOPROLOL TARTRATE 25 MG PO TABS
25.0000 mg | ORAL_TABLET | Freq: Every day | ORAL | Status: DC
  Administered 2017-08-17: 25 mg via ORAL
  Filled 2017-08-14 (×3): qty 1

## 2017-08-14 MED ORDER — SODIUM CHLORIDE 0.9 % IJ SOLN
INTRAMUSCULAR | Status: DC | PRN
  Administered 2017-08-14: 10 mL

## 2017-08-14 MED ORDER — PROPOFOL 10 MG/ML IV BOLUS
INTRAVENOUS | Status: DC | PRN
  Administered 2017-08-14: 150 mg via INTRAVENOUS

## 2017-08-14 MED ORDER — HEPARIN SODIUM (PORCINE) 5000 UNIT/ML IJ SOLN
5000.0000 [IU] | Freq: Three times a day (TID) | INTRAMUSCULAR | Status: DC
  Administered 2017-08-14 – 2017-08-18 (×13): 5000 [IU] via SUBCUTANEOUS
  Filled 2017-08-14 (×13): qty 1

## 2017-08-14 MED ORDER — ACETAMINOPHEN 500 MG PO TABS
1000.0000 mg | ORAL_TABLET | ORAL | Status: AC
  Administered 2017-08-14: 1000 mg via ORAL
  Filled 2017-08-14: qty 2

## 2017-08-14 MED ORDER — PROMETHAZINE HCL 25 MG/ML IJ SOLN
12.5000 mg | Freq: Four times a day (QID) | INTRAMUSCULAR | Status: DC | PRN
  Administered 2017-08-14 – 2017-08-15 (×2): 12.5 mg via INTRAVENOUS
  Filled 2017-08-14 (×2): qty 1

## 2017-08-14 MED ORDER — MIDAZOLAM HCL 2 MG/2ML IJ SOLN
INTRAMUSCULAR | Status: AC
  Filled 2017-08-14: qty 2

## 2017-08-14 MED ORDER — HYDROMORPHONE HCL 1 MG/ML IJ SOLN
0.2500 mg | INTRAMUSCULAR | Status: DC | PRN
  Administered 2017-08-14 (×3): 0.5 mg via INTRAVENOUS

## 2017-08-14 MED ORDER — SODIUM CHLORIDE 0.9 % IJ SOLN
INTRAMUSCULAR | Status: AC
  Filled 2017-08-14: qty 10

## 2017-08-14 MED ORDER — OXYCODONE HCL 5 MG PO TABS: 5.0000 mg | ORAL_TABLET | Freq: Once | ORAL | Status: DC | PRN

## 2017-08-14 MED ORDER — SUGAMMADEX SODIUM 500 MG/5ML IV SOLN
INTRAVENOUS | Status: DC | PRN
  Administered 2017-08-14: 300 mg via INTRAVENOUS

## 2017-08-14 MED ORDER — LEVOTHYROXINE SODIUM 75 MCG PO TABS
75.0000 ug | ORAL_TABLET | Freq: Every day | ORAL | Status: DC
  Administered 2017-08-16 – 2017-08-17 (×2): 75 ug via ORAL
  Filled 2017-08-14 (×3): qty 1

## 2017-08-14 MED ORDER — ACETAMINOPHEN 500 MG PO TABS
1000.0000 mg | ORAL_TABLET | Freq: Four times a day (QID) | ORAL | Status: DC
  Administered 2017-08-14 – 2017-08-18 (×14): 1000 mg via ORAL
  Filled 2017-08-14 (×14): qty 2

## 2017-08-14 MED ORDER — MIDAZOLAM HCL 5 MG/5ML IJ SOLN
INTRAMUSCULAR | Status: DC | PRN
  Administered 2017-08-14: 2 mg via INTRAVENOUS

## 2017-08-14 MED ORDER — LAMOTRIGINE 25 MG PO TABS
150.0000 mg | ORAL_TABLET | Freq: Every day | ORAL | Status: DC
  Administered 2017-08-15 – 2017-08-17 (×3): 150 mg via ORAL
  Filled 2017-08-14 (×3): qty 2

## 2017-08-14 MED ORDER — KCL IN DEXTROSE-NACL 20-5-0.45 MEQ/L-%-% IV SOLN
INTRAVENOUS | Status: DC
  Administered 2017-08-14 – 2017-08-17 (×6): via INTRAVENOUS
  Filled 2017-08-14 (×5): qty 1000

## 2017-08-14 MED ORDER — PROMETHAZINE HCL 25 MG/ML IJ SOLN: 6.2500 mg | INTRAMUSCULAR | Status: DC | PRN

## 2017-08-14 MED ORDER — LABETALOL HCL 5 MG/ML IV SOLN
INTRAVENOUS | Status: AC
  Filled 2017-08-14: qty 4

## 2017-08-14 MED ORDER — FENTANYL CITRATE (PF) 100 MCG/2ML IJ SOLN
INTRAMUSCULAR | Status: AC
  Filled 2017-08-14: qty 2

## 2017-08-14 MED ORDER — QUETIAPINE FUMARATE ER 200 MG PO TB24
200.0000 mg | ORAL_TABLET | Freq: Every day | ORAL | Status: DC
  Administered 2017-08-16 – 2017-08-17 (×2): 200 mg via ORAL
  Filled 2017-08-14 (×5): qty 1

## 2017-08-14 MED ORDER — KETAMINE HCL 10 MG/ML IJ SOLN
INTRAMUSCULAR | Status: DC | PRN
  Administered 2017-08-14: 30 mg via INTRAVENOUS

## 2017-08-14 MED ORDER — HEPARIN SODIUM (PORCINE) 5000 UNIT/ML IJ SOLN
5000.0000 [IU] | Freq: Once | INTRAMUSCULAR | Status: AC
  Administered 2017-08-14: 5000 [IU] via SUBCUTANEOUS
  Filled 2017-08-14: qty 1

## 2017-08-14 MED ORDER — PROPOFOL 10 MG/ML IV BOLUS
INTRAVENOUS | Status: AC
  Filled 2017-08-14: qty 20

## 2017-08-14 MED ORDER — PHENYLEPHRINE 40 MCG/ML (10ML) SYRINGE FOR IV PUSH (FOR BLOOD PRESSURE SUPPORT)
PREFILLED_SYRINGE | INTRAVENOUS | Status: AC
  Filled 2017-08-14: qty 10

## 2017-08-14 MED ORDER — BUPIVACAINE LIPOSOME 1.3 % IJ SUSP
INTRAMUSCULAR | Status: DC | PRN
  Administered 2017-08-14: 20 mL

## 2017-08-14 MED ORDER — HYDROMORPHONE HCL 1 MG/ML IJ SOLN
INTRAMUSCULAR | Status: AC
  Filled 2017-08-14: qty 1

## 2017-08-14 MED ORDER — CHLORHEXIDINE GLUCONATE CLOTH 2 % EX PADS: 6.0000 | MEDICATED_PAD | Freq: Once | CUTANEOUS | Status: DC

## 2017-08-14 MED ORDER — LIDOCAINE 2% (20 MG/ML) 5 ML SYRINGE
INTRAMUSCULAR | Status: AC
  Filled 2017-08-14: qty 5

## 2017-08-14 MED ORDER — GABAPENTIN 300 MG PO CAPS
300.0000 mg | ORAL_CAPSULE | ORAL | Status: AC
  Administered 2017-08-14: 300 mg via ORAL
  Filled 2017-08-14: qty 1

## 2017-08-14 MED ORDER — ROCURONIUM BROMIDE 100 MG/10ML IV SOLN
INTRAVENOUS | Status: AC
  Filled 2017-08-14: qty 1

## 2017-08-14 MED ORDER — FAMOTIDINE IN NACL 20-0.9 MG/50ML-% IV SOLN
20.0000 mg | Freq: Two times a day (BID) | INTRAVENOUS | Status: DC
  Administered 2017-08-14 – 2017-08-18 (×9): 20 mg via INTRAVENOUS
  Filled 2017-08-14 (×9): qty 50

## 2017-08-14 MED ORDER — LIDOCAINE 2% (20 MG/ML) 5 ML SYRINGE
INTRAMUSCULAR | Status: DC | PRN
  Administered 2017-08-14: 100 mg via INTRAVENOUS

## 2017-08-14 MED ORDER — EPHEDRINE SULFATE 50 MG/ML IJ SOLN
INTRAMUSCULAR | Status: DC | PRN
  Administered 2017-08-14 (×3): 10 mg via INTRAVENOUS

## 2017-08-14 MED ORDER — LACTATED RINGERS IV SOLN
INTRAVENOUS | Status: DC | PRN
  Administered 2017-08-14 (×2): via INTRAVENOUS

## 2017-08-14 MED ORDER — HYDROMORPHONE HCL 1 MG/ML IJ SOLN
INTRAMUSCULAR | Status: AC
  Administered 2017-08-14: 0.5 mg via INTRAVENOUS
  Filled 2017-08-14: qty 1

## 2017-08-14 MED ORDER — CELECOXIB 200 MG PO CAPS
200.0000 mg | ORAL_CAPSULE | Freq: Two times a day (BID) | ORAL | Status: DC
  Administered 2017-08-15: 200 mg via ORAL
  Filled 2017-08-14: qty 1

## 2017-08-14 MED ORDER — CELECOXIB 200 MG PO CAPS
200.0000 mg | ORAL_CAPSULE | ORAL | Status: AC
  Administered 2017-08-14: 200 mg via ORAL
  Filled 2017-08-14: qty 1

## 2017-08-14 MED ORDER — GABAPENTIN 300 MG PO CAPS
300.0000 mg | ORAL_CAPSULE | Freq: Two times a day (BID) | ORAL | Status: DC
  Administered 2017-08-14 – 2017-08-18 (×8): 300 mg via ORAL
  Filled 2017-08-14 (×8): qty 1

## 2017-08-14 MED ORDER — BUPIVACAINE-EPINEPHRINE (PF) 0.25% -1:200000 IJ SOLN
INTRAMUSCULAR | Status: AC
  Filled 2017-08-14: qty 30

## 2017-08-14 MED ORDER — ONDANSETRON HCL 4 MG/2ML IJ SOLN
4.0000 mg | Freq: Four times a day (QID) | INTRAMUSCULAR | Status: DC | PRN
  Administered 2017-08-14 – 2017-08-15 (×2): 4 mg via INTRAVENOUS
  Filled 2017-08-14 (×2): qty 2

## 2017-08-14 MED ORDER — DEXAMETHASONE SODIUM PHOSPHATE 10 MG/ML IJ SOLN
INTRAMUSCULAR | Status: DC | PRN
  Administered 2017-08-14: 6 mg via INTRAVENOUS

## 2017-08-14 MED ORDER — ONDANSETRON HCL 4 MG/2ML IJ SOLN
INTRAMUSCULAR | Status: DC | PRN
  Administered 2017-08-14: 4 mg via INTRAVENOUS

## 2017-08-14 MED ORDER — FENTANYL CITRATE (PF) 100 MCG/2ML IJ SOLN
INTRAMUSCULAR | Status: DC | PRN
  Administered 2017-08-14 (×6): 50 ug via INTRAVENOUS

## 2017-08-14 MED ORDER — ONDANSETRON 4 MG PO TBDP: 4.0000 mg | ORAL_TABLET | Freq: Four times a day (QID) | ORAL | Status: DC | PRN

## 2017-08-14 MED ORDER — MEPERIDINE HCL 50 MG/ML IJ SOLN: 6.2500 mg | INTRAMUSCULAR | Status: DC | PRN

## 2017-08-14 MED ORDER — HYDRALAZINE HCL 20 MG/ML IJ SOLN: 10.0000 mg | INTRAMUSCULAR | Status: DC | PRN

## 2017-08-14 MED ORDER — SODIUM CHLORIDE 0.9 % IV SOLN
8.0000 mg | Freq: Once | INTRAVENOUS | Status: AC
  Administered 2017-08-14: 8 mg via INTRAVENOUS
  Filled 2017-08-14: qty 4

## 2017-08-14 MED ORDER — LURASIDONE HCL 40 MG PO TABS
40.0000 mg | ORAL_TABLET | Freq: Every day | ORAL | Status: DC
  Administered 2017-08-15 – 2017-08-18 (×4): 40 mg via ORAL
  Filled 2017-08-14 (×5): qty 1

## 2017-08-14 MED ORDER — OXYCODONE HCL 5 MG PO TABS
5.0000 mg | ORAL_TABLET | ORAL | Status: DC | PRN
  Administered 2017-08-15: 5 mg via ORAL
  Filled 2017-08-14: qty 1

## 2017-08-14 MED ORDER — PHENYLEPHRINE HCL 10 MG/ML IJ SOLN
INTRAMUSCULAR | Status: DC | PRN
  Administered 2017-08-14 (×2): 80 ug via INTRAVENOUS
  Administered 2017-08-14 (×3): 120 ug via INTRAVENOUS
  Administered 2017-08-14: 80 ug via INTRAVENOUS

## 2017-08-14 MED ORDER — CIPROFLOXACIN IN D5W 400 MG/200ML IV SOLN
400.0000 mg | INTRAVENOUS | Status: AC
  Administered 2017-08-14: 400 mg via INTRAVENOUS
  Filled 2017-08-14: qty 200

## 2017-08-14 MED ORDER — ROCURONIUM BROMIDE 50 MG/5ML IV SOSY
PREFILLED_SYRINGE | INTRAVENOUS | Status: DC | PRN
  Administered 2017-08-14: 60 mg via INTRAVENOUS
  Administered 2017-08-14: 10 mg via INTRAVENOUS
  Administered 2017-08-14 (×2): 20 mg via INTRAVENOUS
  Administered 2017-08-14 (×2): 10 mg via INTRAVENOUS

## 2017-08-14 MED ORDER — HYDROMORPHONE HCL 1 MG/ML IJ SOLN
1.0000 mg | INTRAMUSCULAR | Status: DC | PRN
  Administered 2017-08-14 – 2017-08-17 (×9): 1 mg via INTRAVENOUS
  Filled 2017-08-14 (×8): qty 1

## 2017-08-14 MED ORDER — ONDANSETRON HCL 4 MG/2ML IJ SOLN
INTRAMUSCULAR | Status: AC
  Filled 2017-08-14: qty 2

## 2017-08-14 MED ORDER — OXYCODONE HCL 5 MG/5ML PO SOLN
5.0000 mg | Freq: Once | ORAL | Status: DC | PRN
  Filled 2017-08-14: qty 5

## 2017-08-14 MED ORDER — KETAMINE HCL 10 MG/ML IJ SOLN
INTRAMUSCULAR | Status: AC
  Filled 2017-08-14: qty 1

## 2017-08-14 MED ORDER — 0.9 % SODIUM CHLORIDE (POUR BTL) OPTIME
TOPICAL | Status: DC | PRN
  Administered 2017-08-14: 1000 mL

## 2017-08-14 MED ORDER — SUGAMMADEX SODIUM 500 MG/5ML IV SOLN
INTRAVENOUS | Status: AC
  Filled 2017-08-14: qty 5

## 2017-08-14 MED ORDER — CIPROFLOXACIN IN D5W 400 MG/200ML IV SOLN
400.0000 mg | Freq: Two times a day (BID) | INTRAVENOUS | Status: AC
  Administered 2017-08-14: 400 mg via INTRAVENOUS
  Filled 2017-08-14: qty 200

## 2017-08-14 MED ORDER — DEXAMETHASONE SODIUM PHOSPHATE 10 MG/ML IJ SOLN
INTRAMUSCULAR | Status: AC
  Filled 2017-08-14: qty 1

## 2017-08-14 SURGICAL SUPPLY — 48 items
ADH SKN CLS APL DERMABOND .7 (GAUZE/BANDAGES/DRESSINGS) ×1
BINDER ABDOMINAL 12 ML 46-62 (SOFTGOODS) ×2 IMPLANT
CABLE HIGH FREQUENCY MONO STRZ (ELECTRODE) ×3 IMPLANT
COVER SURGICAL LIGHT HANDLE (MISCELLANEOUS) ×3 IMPLANT
DECANTER SPIKE VIAL GLASS SM (MISCELLANEOUS) ×3 IMPLANT
DERMABOND ADVANCED (GAUZE/BANDAGES/DRESSINGS) ×2
DERMABOND ADVANCED .7 DNX12 (GAUZE/BANDAGES/DRESSINGS) ×1 IMPLANT
DEVICE SECURE STRAP 25 ABSORB (INSTRUMENTS) IMPLANT
DEVICE SUTURE ENDOST 10MM (ENDOMECHANICALS) ×2 IMPLANT
DEVICE TROCAR PUNCTURE CLOSURE (ENDOMECHANICALS) ×3 IMPLANT
DISSECTOR BLUNT TIP ENDO 5MM (MISCELLANEOUS) IMPLANT
DRSG OPSITE POSTOP 4X6 (GAUZE/BANDAGES/DRESSINGS) ×2 IMPLANT
DRSG TEGADERM 2-3/8X2-3/4 SM (GAUZE/BANDAGES/DRESSINGS) ×2 IMPLANT
ELECT PENCIL ROCKER SW 15FT (MISCELLANEOUS) ×3 IMPLANT
ELECT REM PT RETURN 15FT ADLT (MISCELLANEOUS) ×3 IMPLANT
GAUZE SPONGE 2X2 8PLY STRL LF (GAUZE/BANDAGES/DRESSINGS) IMPLANT
GLOVE BIOGEL M 8.0 STRL (GLOVE) ×3 IMPLANT
GLOVE BIOGEL PI IND STRL 6.5 (GLOVE) IMPLANT
GLOVE BIOGEL PI IND STRL 7.0 (GLOVE) IMPLANT
GLOVE BIOGEL PI INDICATOR 6.5 (GLOVE) ×4
GLOVE BIOGEL PI INDICATOR 7.0 (GLOVE) ×2
GLOVE SURG SS PI 7.0 STRL IVOR (GLOVE) ×2 IMPLANT
GOWN STRL REUS W/TWL XL LVL3 (GOWN DISPOSABLE) ×9 IMPLANT
KIT BASIN OR (CUSTOM PROCEDURE TRAY) ×3 IMPLANT
MARKER SKIN DUAL TIP RULER LAB (MISCELLANEOUS) ×3 IMPLANT
MESH HERNIA 3X6 (Mesh General) ×2 IMPLANT
NDL SPNL 22GX3.5 QUINCKE BK (NEEDLE) ×1 IMPLANT
NEEDLE SPNL 22GX3.5 QUINCKE BK (NEEDLE) ×3 IMPLANT
SCISSORS LAP 5X45 EPIX DISP (ENDOMECHANICALS) ×2 IMPLANT
SCRUB TECHNI CARE 4 OZ NO DYE (MISCELLANEOUS) ×3 IMPLANT
SET IRRIG TUBING LAPAROSCOPIC (IRRIGATION / IRRIGATOR) IMPLANT
SHEARS HARMONIC ACE PLUS 45CM (MISCELLANEOUS) ×2 IMPLANT
SLEEVE XCEL OPT CAN 5 100 (ENDOMECHANICALS) ×6 IMPLANT
SPONGE GAUZE 2X2 STER 10/PKG (GAUZE/BANDAGES/DRESSINGS) ×2
SPONGE LAP 18X18 X RAY DECT (DISPOSABLE) ×2 IMPLANT
STAPLER VISISTAT 35W (STAPLE) ×3 IMPLANT
SUT NOVA NAB DX-16 0-1 5-0 T12 (SUTURE) ×9 IMPLANT
SUT PROLENE 1 CT 1 30 (SUTURE) ×4 IMPLANT
SUT SURGIDAC NAB ES-9 0 48 120 (SUTURE) ×6 IMPLANT
SUT VIC AB 4-0 SH 18 (SUTURE) ×3 IMPLANT
TACKER 5MM HERNIA 3.5CML NAB (ENDOMECHANICALS) IMPLANT
TOWEL OR 17X26 10 PK STRL BLUE (TOWEL DISPOSABLE) ×3 IMPLANT
TOWEL OR NON WOVEN STRL DISP B (DISPOSABLE) ×3 IMPLANT
TRAY LAPAROSCOPIC (CUSTOM PROCEDURE TRAY) ×3 IMPLANT
TROCAR BLADELESS OPT 5 100 (ENDOMECHANICALS) ×3 IMPLANT
TROCAR XCEL NON-BLD 11X100MML (ENDOMECHANICALS) ×2 IMPLANT
TUBING INSUF HEATED (TUBING) ×3 IMPLANT
YANKAUER SUCT BULB TIP 10FT TU (MISCELLANEOUS) ×2 IMPLANT

## 2017-08-14 NOTE — Op Note (Signed)
Cynthia [name]  Jan 10, 1966 @DATE @   PCP:  Josetta Huddle, MD   Surgeon: Kaylyn Lim, MD, FACS  Asst:  None  Anes:  General  Postop Dx: Ventral hernia floppy cecum  Procedure: Laparoscopic takedown incarcerated  hernia containing the omentum, laparoscopic cecopexy with 3 sutures intracorporeal Surg[place]k, ventral incisional hernia repair with relaxing incisions and onlay Marlex type mesh secured with 16 sutures Location Surgery: [facility] 4 Complications: none  EBL:   20 cc  Drains: none  Description of Procedure:  The patient was taken to OR 4 .  After anesthesia was administered and the patient was prepped a timeout was performed.  Access to the abdomen with a 5 mm Optiview to the left upper quadrant.  5 mm trochars were placed in the right upper quadrant in the lower midline.  Sharp dissection and harmonic scalpel were used to take down the incarcerated omentum.  The cecum was examined and was floppy.  Where we had performed the appendectomy before there were no adhesions and the cecum was very floppy.  Using the Endostitch with 2-0 Surgidek, I placed 3 sutures taking the mesentery and pericecal fat to the abdominal sidewall.  For this a 12 trocar was placed through the hernia sac.    Next I excised the incision scar and stripped away the large hernia sac.  The excess sac was excised.  The margins were attenuated.  Relaxing incisions were made.  I placed 8 simple untied sutures around the wound under direct vision.  I closed the fascia primarily with a running #1 prolene.  Marlex type mesh was threaded down on to the fascia and secured.  The abdomen was reinflated and examined from inside and appeared secure.  8 more sutures were placed to further secure the mesh anteriorly.  The wound was irrigated and tacked with 4-0 vicryl.  Another endoscopic view and no issues with final sutures inside.  Copious irrigation and closure with staples.    The patient tolerated the procedure well and was taken to  the PACU in stable condition.     Matt B. Hassell Done, [place], [facility], [place]

## 2017-08-14 NOTE — H&P (Signed)
Chief Complaint:  Midline abdominal pain and incarcerated  History of Present Illness:  Cynthia [name] is an 52 y.o. female who underwent laparotomy and appendectomy Sept 2017 and developed a ventral hernia.  It is symptomatic and she is here for management.    Past Medical History:  Diagnosis Date  . Allergy   . Anxiety   . Bladder disorder   . Colitis 2006   occurred in presnce of SBO , no issues since   . Colon polyps   . Complication of anesthesia    problems waking up  . Diastolic congestive heart failure ([facility])    with pericarditis- no issues last 5 years 09-2015   . Diverticulosis   . History of depression   . History of migraines   . Hyperlipemia   . Hypothyroidism   . Kidney stone   . Paroxysmal ventricular tachycardia ([facility])   . Pericarditis   . Rapid heart rate    maybe once a month since pericarditis  . Rosacea   . SBO (small bowel obstruction) ([facility]) 08/16/2015  . Vitamin D deficiency     Past Surgical History:  Procedure Laterality Date  . APPENDECTOMY    . CARDIAC CATHETERIZATION  11/15/08   SMOOTH AND NORMAL  . CHOLECYSTECTOMY    . COLONOSCOPY    . LAPAROSCOPY    . LAPAROSCOPY N/A 10/26/2015   Procedure: diagnostic LAPAROSCOPY LAPAROTOMY AND APPENDECTOMY;  Surgeon:  , MD;  Location: [facility];  Service: General;  Laterality: N/A;  . LAPAROTOMY  2017  . POLYPECTOMY  02/2009  . SHOULDER SURGERY     left  . TOTAL ABDOMINAL HYSTERECTOMY W/ BILATERAL SALPINGOOPHORECTOMY    . UPPER GASTROINTESTINAL ENDOSCOPY      Current Facility-Administered Medications  Medication Dose Route Frequency Provider Last Rate Last Dose  . bupivacaine liposome (EXPAREL) 1.3 % injection 266 mg  20 mL Infiltration Once , , MD      . Chlorhexidine Gluconate Cloth 2 % PADS 6 each  6 each Topical Once , , MD       And  . Chlorhexidine Gluconate Cloth 2 % PADS 6 each  6 each Topical Once , , MD      . ciprofloxacin (CIPRO) IVPB 400 mg  400  mg Intravenous On Call to OR , , MD       Facility-Administered Medications Ordered in Other Encounters  Medication Dose Route Frequency Provider Last Rate Last Dose  . lactated ringers infusion    Continuous PRN Walker, Karen L, CRNA       Amoxicillin; Ampicillin; Apple fruit extract; Cherry extract; Fruit & vegetable daily [nutritional supplements]; Imitrex [sumatriptan]; Other; Peach [prunus persica]; Strawberry extract; and Zoloft [sertraline hcl] Family History  Adopted: Yes   Social History:   reports that she has never smoked. She has never used smokeless tobacco. She reports that she does not drink alcohol or use drugs.   REVIEW OF SYSTEMS : Negative except for see problem list  Physical Exam:   Blood pressure 104/63, pulse 80, temperature 97.9 F (36.6 C), temperature source Oral, resp. rate 16, height 5' 7.5" (1.715 m), weight 107.5 kg (237 lb), SpO2 95 %. Body mass index is 36.57 kg/m.  Gen:  WDWN WF NAD  Neurological: Alert and oriented to person, place, and time. Motor and sensory function is grossly intact  Head: Normocephalic and atraumatic.  Eyes: Conjunctivae are normal. Pupils are equal, round, and reactive to light. No scleral icterus.  Neck: Normal range of   motion. Neck supple. No tracheal deviation or thyromegaly present.  Cardiovascular:  SR without murmurs or gallops.  No carotid bruits Breast:  Not examined Respiratory: Effort normal.  No respiratory distress. No chest wall tenderness. Breath sounds normal.  No wheezes, rales or rhonchi.  Abdomen:  Ventral hernia is soft at present but intermittently firm GU:  Not examined Musculoskeletal: Normal range of motion. Extremities are nontender. No cyanosis, edema or clubbing noted Lymphadenopathy: No cervical, preauricular, postauricular or axillary adenopathy is present Skin: Skin is warm and dry. No rash noted. No diaphoresis. No erythema. No pallor. Pscyh: Normal mood and affect. Behavior is  normal. Judgment and thought content normal.   LABORATORY RESULTS: Results for orders placed or performed during the hospital encounter of 08/13/17 (from the past 48 hour(s))  CBC     Status: None   Collection Time: 08/13/17  8:39 AM  Result Value Ref Range   WBC 6.6 4.0 - 10.5 K/uL   RBC 4.35 3.87 - 5.11 MIL/uL   Hemoglobin 13.1 12.0 - 15.0 g/dL   HCT 40.7 36.0 - 46.0 %   MCV 93.6 78.0 - 100.0 fL   MCH 30.1 26.0 - 34.0 pg   MCHC 32.2 30.0 - 36.0 g/dL   RDW 13.8 11.5 - 15.5 %   Platelets 241 150 - 400 K/uL    Comment: Performed at [facility], [place] [address], [place], [place] [address]  Basic metabolic panel     Status: Abnormal   Collection Time: 08/13/17  8:39 AM  Result Value Ref Range   Sodium 137 135 - 145 mmol/L   Potassium 3.7 3.5 - 5.1 mmol/L   Chloride 103 98 - 111 mmol/L    Comment: Please note change in reference range.   CO2 25 22 - 32 mmol/L   Glucose, Bld 215 (H) 70 - 99 mg/dL    Comment: Please note change in reference range.   BUN 13 6 - 20 mg/dL    Comment: Please note change in reference range.   Creatinine, Ser 0.98 0.44 - 1.00 mg/dL   Calcium 8.8 (L) 8.9 - 10.3 mg/dL   GFR calc non Af Amer >60 >60 mL/min   GFR calc Af Amer >60 >60 mL/min    Comment: (NOTE) The eGFR has been calculated using the CKD EPI equation. This calculation has not been validated in all clinical situations. eGFR's persistently <60 mL/min signify possible Chronic Kidney Disease.    Anion gap 9 5 - 15    Comment: Performed at [facility], [place] [address], [place], [place] [address]     RADIOLOGY RESULTS: No results found.  Problem List: Patient Active Problem List   Diagnosis Date Noted  . Precordial chest pain 05/04/2017  . Infective otitis externa of right ear   . 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 Sepsis ([place]) 11/07/2015  . Malignant otitis externa 11/07/2015  . Acute malignant otitis externa of right ear   .  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 Partial small bowel obstruction ([place])   . Abdominal pain 08/16/2015  . Generalized abdominal pain   . Intractable cyclical vomiting with nausea   . Pericarditis   . Pericarditis, acute 05/03/2010  . Obesity 05/03/2010  . Diastolic congestive heart failure ([place])   . Hyperlipemia   . Hypothyroidism   . History of depression   . History of migraine   . Rosacea   . Anxiety     Assessment & Plan:  Symptomatic ventral hernia for lap assisted repair.      Matt B. Hassell Done, MD, [facility] [phone] beeper [phone]  08/14/2017 7:36 AM

## 2017-08-14 NOTE — Anesthesia Procedure Notes (Signed)
Procedure Name: Intubation Date/Time: 08/14/2017 7:56 AM Performed by: West Pugh, CRNA Pre-anesthesia Checklist: Patient identified, Emergency Drugs available, Suction available, Patient being monitored and Timeout performed Patient Re-evaluated:Patient Re-evaluated prior to induction Oxygen Delivery Method: Circle system utilized Preoxygenation: Pre-oxygenation with 100% oxygen Induction Type: IV induction and Cricoid Pressure applied Ventilation: Mask ventilation without difficulty Laryngoscope Size: Mac and 3 Grade View: Grade I Tube type: Oral Tube size: 7.5 mm Number of attempts: 1 Airway Equipment and Method: Stylet Placement Confirmation: ETT inserted through vocal cords under direct vision,  positive ETCO2,  CO2 detector and breath sounds checked- equal and bilateral Secured at: 21 cm Tube secured with: Tape Dental Injury: Teeth and Oropharynx as per pre-operative assessment

## 2017-08-14 NOTE — Interval H&P Note (Signed)
History and Physical Interval Note:  08/14/2017 7:40 AM  Cynthia [name]  has presented today for surgery, with the diagnosis of Ventral hernia  The various methods of treatment have been discussed with the patient and family. After consideration of risks, benefits and other options for treatment, the patient has consented to  Procedure(s): [place] (N/A) as a surgical intervention .  The patient's history has been reviewed, patient examined, no change in status, stable for surgery.  I have reviewed the patient's chart and labs.  Questions were answered to the patient's satisfaction.     Pedro Earls

## 2017-08-14 NOTE — Anesthesia Preprocedure Evaluation (Signed)
Anesthesia Evaluation  Patient identified by MRN, date of birth, ID band Patient awake    Reviewed: Allergy & Precautions, NPO status , Patient's Chart, lab work & pertinent test results  History of Anesthesia Complications (+) history of anesthetic complications  Airway Mallampati: II  TM Distance: >3 FB Neck ROM: Full    Dental no notable dental hx.    Pulmonary neg pulmonary ROS,    Pulmonary exam normal breath sounds clear to auscultation       Cardiovascular +CHF  Normal cardiovascular exam Rhythm:Regular Rate:Normal  ECHO 07-04-13:  Study Conclusions  - Left ventricle: The cavity size was normal. Systolic function was normal. The estimated ejection fraction was in the range of 55% to 60%. Wall motion was normal; there were no regional wall motion abnormalities. - Left atrium: The atrium was mildly dilated. - Atrial septum: No defect or patent foramen ovale was identified.   Neuro/Psych Anxiety negative neurological ROS     GI/Hepatic Neg liver ROS, GERD  ,  Endo/Other  Hypothyroidism   Renal/GU Renal disease  negative genitourinary   Musculoskeletal negative musculoskeletal ROS (+)   Abdominal (+) + obese,   Peds negative pediatric ROS (+)  Hematology negative hematology ROS (+)   Anesthesia Other Findings   Reproductive/Obstetrics negative OB ROS                             Anesthesia Physical  Anesthesia Plan  ASA: II  Anesthesia Plan: General   Post-op Pain Management:    Induction: Intravenous  PONV Risk Score and Plan: 3 and Ondansetron, Dexamethasone and Midazolam  Airway Management Planned: Oral ETT  Additional Equipment:   Intra-op Plan:   Post-operative Plan: Extubation in OR  Informed Consent: I have reviewed the patients History and Physical, chart, labs and discussed the procedure including the risks, benefits and alternatives for the  proposed anesthesia with the patient or authorized representative who has indicated his/her understanding and acceptance.   Dental advisory given  Plan Discussed with: CRNA  Anesthesia Plan Comments:         Anesthesia Quick Evaluation

## 2017-08-14 NOTE — Anesthesia Postprocedure Evaluation (Signed)
Anesthesia Post Note  Patient: Cynthia [name]  Procedure(s) Performed: LAPAROSCOPIC ASSISTED VENTRAL HERNIA REPAIR ERAS PATHWAY, CECOPEXY (N/A )     Patient location during evaluation: PACU Anesthesia Type: General Level of consciousness: awake and alert Pain management: pain level controlled Vital Signs Assessment: post-procedure vital signs reviewed and stable Respiratory status: spontaneous breathing, nonlabored ventilation, respiratory function stable and patient connected to nasal cannula oxygen Cardiovascular status: blood pressure returned to baseline and stable Postop Assessment: no apparent nausea or vomiting Anesthetic complications: no    Last Vitals:  Vitals:   08/14/17 1215 08/14/17 1241  BP: (!) 118/56 (!) 145/69  Pulse: 86 97  Resp: 14 14  Temp: 36.6 C 36.9 C  SpO2: 93% 99%    Last Pain:  Vitals:   08/14/17 1215  TempSrc:   PainSc: 3                  Tiajuana Amass

## 2017-08-14 NOTE — Progress Notes (Signed)
Pt nauseated when I assumed care at change of shift 1900. Phenergan given during bedside reporting. Pt still due to void but assured me that she would get up and void when she no longer felt nauseated. Pt also in pain. Dilaudid PRN given as per MD order. Pt declined PO meds due to nausea.

## 2017-08-14 NOTE — Transfer of Care (Signed)
Immediate Anesthesia Transfer of Care Note  Patient: Cynthia [name]  Procedure(s) Performed: LAPAROSCOPIC ASSISTED VENTRAL HERNIA REPAIR ERAS PATHWAY, CECOPEXY (N/A )  Patient Location: PACU  Anesthesia Type:General  Level of Consciousness: awake, oriented, drowsy and patient cooperative  Airway & Oxygen Therapy: Patient Spontanous Breathing and Patient connected to face mask oxygen  Post-op Assessment: Report given to RN and Post -op Vital signs reviewed and stable  Post vital signs: Reviewed and stable  Last Vitals:  Vitals Value Taken Time  BP 122/69 08/14/2017 11:24 AM  Temp    Pulse 99 08/14/2017 11:26 AM  Resp 8 08/14/2017 11:26 AM  SpO2 98 % 08/14/2017 11:26 AM  Vitals shown include unvalidated device data.  Last Pain:  Vitals:   08/14/17 0612  TempSrc: Oral  PainSc:       Patients Stated Pain Goal: 4 (72/55/00 1642)  Complications: No apparent anesthesia complications

## 2017-08-15 MED ORDER — RIZATRIPTAN BENZOATE 10 MG PO TABS: 10.0000 mg | ORAL_TABLET | ORAL | Status: DC | PRN

## 2017-08-15 MED ORDER — ZOLPIDEM TARTRATE 5 MG PO TABS: 5.0000 mg | ORAL_TABLET | Freq: Every evening | ORAL | Status: DC | PRN

## 2017-08-15 MED ORDER — ONDANSETRON HCL 4 MG/2ML IJ SOLN: 4.0000 mg | Freq: Four times a day (QID) | INTRAMUSCULAR | Status: DC | PRN

## 2017-08-15 MED ORDER — SODIUM CHLORIDE 0.9 % IV SOLN
8.0000 mg | Freq: Three times a day (TID) | INTRAVENOUS | Status: AC
  Administered 2017-08-15 – 2017-08-17 (×6): 8 mg via INTRAVENOUS
  Filled 2017-08-15 (×6): qty 4

## 2017-08-15 MED ORDER — ZONISAMIDE 100 MG PO CAPS
200.0000 mg | ORAL_CAPSULE | Freq: Every day | ORAL | Status: DC
  Administered 2017-08-17: 200 mg via ORAL
  Filled 2017-08-15 (×4): qty 2

## 2017-08-15 MED ORDER — KETOROLAC TROMETHAMINE 15 MG/ML IJ SOLN
15.0000 mg | Freq: Three times a day (TID) | INTRAMUSCULAR | Status: DC
  Administered 2017-08-15 – 2017-08-18 (×10): 15 mg via INTRAVENOUS
  Filled 2017-08-15 (×10): qty 1

## 2017-08-15 MED ORDER — ROSUVASTATIN CALCIUM 20 MG PO TABS
40.0000 mg | ORAL_TABLET | ORAL | Status: DC
  Administered 2017-08-17: 40 mg via ORAL
  Filled 2017-08-15: qty 2

## 2017-08-15 NOTE — Progress Notes (Signed)
1 Day Post-Op   Subjective/Chief Complaint: Nausea not well controlled, emesis overnight, feels awful, no bowel function   Objective: Vital signs in last 24 hours: Temp:  [97.8 F (36.6 C)-98.8 F (37.1 C)] 98.7 F (37.1 C) (07/13 1012) Pulse Rate:  [86-102] 92 (07/13 1012) Resp:  [8-18] 18 (07/13 1012) BP: (100-145)/(56-81) 100/73 (07/13 1012) SpO2:  [90 %-100 %] 98 % (07/13 1012) Last BM Date: 08/14/17  Intake/Output from previous day: 07/12 0701 - 07/13 0700 In: 3488.8 [P.O.:540; I.V.:2748.8; IV Piggyback:200] Out: 850 [Urine:850] Intake/Output this shift: Total I/O In: 143.8 [I.V.:143.8] Out: -   Resp: clear to auscultation bilaterally Cardio: regular rate and rhythm GI: soft approp tender incisions clean, some bs present  Lab Results:  Recent Labs    08/13/17 0839 08/14/17 1303  WBC 6.6 15.2*  HGB 13.1 13.4  HCT 40.7 40.8  PLT 241 234   BMET Recent Labs    08/13/17 0839 08/14/17 1303  NA 137  --   K 3.7  --   CL 103  --   CO2 25  --   GLUCOSE 215*  --   BUN 13  --   CREATININE 0.98 0.85  CALCIUM 8.8*  --    PT/INR No results for input(s): LABPROT, INR in the last 72 hours. ABG No results for input(s): PHART, HCO3 in the last 72 hours.  Invalid input(s): PCO2, PO2  Studies/Results: No results found.  Anti-infectives: Anti-infectives (From admission, onward)   Start     Dose/Rate Route Frequency Ordered Stop   08/14/17 1800  ciprofloxacin (CIPRO) IVPB 400 mg     400 mg 200 mL/hr over 60 Minutes Intravenous Every 12 hours 08/14/17 1250 08/14/17 1928   08/14/17 0600  ciprofloxacin (CIPRO) IVPB 400 mg     400 mg 200 mL/hr over 60 Minutes Intravenous On call to O.R. 08/14/17 0544 08/14/17 0817      Assessment/Plan: POD 1 Lap assisted vh repair with mesh, cecopexy -will do scheduled zofran, po making worse so will do sips/chips for now, stop some oral meds that are making worse -pain not controlled well either, will do 24 hours  toradol -check cbc and bmet in am -pulm toilet, oob -discussed surgery and likelihood she will be here several days, possible ileus -sq heparin and scds   [name] 08/15/2017

## 2017-08-15 NOTE — Progress Notes (Signed)
Pt unable to void. Recent bladder scan resulted 900 ml. Dr. Johney Maine aware via phone. See new orders to place foley catheter.

## 2017-08-16 LAB — BASIC METABOLIC PANEL
Anion gap: 6 (ref 5–15)
BUN: 10 mg/dL (ref 6–20)
CALCIUM: 8.5 mg/dL — AB (ref 8.9–10.3)
CHLORIDE: 109 mmol/L (ref 98–111)
CO2: 26 mmol/L (ref 22–32)
CREATININE: 0.83 mg/dL (ref 0.44–1.00)
GFR calc Af Amer: >60 mL/min (ref >60)
Glucose, Bld: 92 mg/dL (ref 70–99)
POTASSIUM: 3.8 mmol/L (ref 3.5–5.1)
SODIUM: 141 mmol/L (ref 135–145)

## 2017-08-16 LAB — CBC
HCT: 40.6 % (ref 36.0–46.0)
HEMOGLOBIN: 12.9 g/dL (ref 12.0–15.0)
MCH: 30.7 pg (ref 26.0–34.0)
MCHC: 31.8 g/dL (ref 30.0–36.0)
MCV: 96.7 fL (ref 78.0–100.0)
PLATELETS: 228 10*3/uL (ref 150–400)
RBC: 4.2 MIL/uL (ref 3.87–5.11)
RDW: 14.6 % (ref 11.5–15.5)
WBC: 7.6 10*3/uL (ref 4.0–10.5)

## 2017-08-16 MED ORDER — ZOLPIDEM TARTRATE 5 MG PO TABS: 5.0000 mg | ORAL_TABLET | Freq: Every evening | ORAL | Status: DC | PRN

## 2017-08-16 NOTE — Progress Notes (Signed)
2 Days Post-Op   Subjective/Chief Complaint: Feels much better, foley back in for retention, no flatus/bm yet, ambulated alot   Objective: Vital signs in last 24 hours: Temp:  [98.3 F (36.8 C)-99 F (37.2 C)] 98.3 F (36.8 C) (07/14 0552) Pulse Rate:  [72-92] 72 (07/14 0552) Resp:  [16-18] 16 (07/14 0552) BP: (100-116)/(66-73) 105/73 (07/14 0552) SpO2:  [95 %-98 %] 98 % (07/14 0552) Last BM Date: 08/14/17  Intake/Output from previous day: 07/13 0701 - 07/14 0700 In: 2135.7 [P.O.:120; I.V.:1800; IV Piggyback:215.7] Out: 1700 [Urine:1700] Intake/Output this shift: No intake/output data recorded.  Resp: clear to auscultation bilaterally Cardio: regular rate and rhythm GI: soft some bs present dressings dry  Lab Results:  Recent Labs    08/14/17 1303 08/16/17 0407  WBC 15.2* 7.6  HGB 13.4 12.9  HCT 40.8 40.6  PLT 234 228   BMET Recent Labs    08/14/17 1303 08/16/17 0407  NA  --  141  K  --  3.8  CL  --  109  CO2  --  26  GLUCOSE  --  92  BUN  --  10  CREATININE 0.85 0.83  CALCIUM  --  8.5*   PT/INR No results for input(s): LABPROT, INR in the last 72 hours. ABG No results for input(s): PHART, HCO3 in the last 72 hours.  Invalid input(s): PCO2, PO2  Studies/Results: No results found.  Anti-infectives: Anti-infectives (From admission, onward)   Start     Dose/Rate Route Frequency Ordered Stop   08/14/17 1800  ciprofloxacin (CIPRO) IVPB 400 mg     400 mg 200 mL/hr over 60 Minutes Intravenous Every 12 hours 08/14/17 1250 08/14/17 1928   08/14/17 0600  ciprofloxacin (CIPRO) IVPB 400 mg     400 mg 200 mL/hr over 60 Minutes Intravenous On call to O.R. 08/14/17 0544 08/14/17 0817      Assessment/Plan: POD 2 Lap assisted vh repair with mesh, cecopexy -will do scheduled zofran again today -clears as tolerated, await bowel function -will continue toradol for 24 hours again, cr normal today -pulm toilet, oob - she will be here for couple more days,  possible ileus -sq heparin and scds    Cynthia [name] 08/16/2017

## 2017-08-17 LAB — BASIC METABOLIC PANEL
ANION GAP: 6 (ref 5–15)
BUN: 9 mg/dL (ref 6–20)
CO2: 26 mmol/L (ref 22–32)
Calcium: 8.3 mg/dL — ABNORMAL LOW (ref 8.9–10.3)
Chloride: 107 mmol/L (ref 98–111)
Creatinine, Ser: 0.95 mg/dL (ref 0.44–1.00)
GFR calc Af Amer: >60 mL/min (ref >60)
GLUCOSE: 165 mg/dL — AB (ref 70–99)
POTASSIUM: 4.2 mmol/L (ref 3.5–5.1)
Sodium: 139 mmol/L (ref 135–145)

## 2017-08-17 NOTE — Progress Notes (Signed)
Patient ID: Cynthia [name], female   DOB: 09-24-1965, 52 y.o.   MRN: [id number] [facility] Progress Note:   3 Days Post-Op  Subjective: Mental status is clear;  Having diffculty voiding Objective: Vital signs in last 24 hours: Temp:  [98 F (36.7 C)-98.9 F (37.2 C)] 98 F (36.7 C) (07/15 1405) Pulse Rate:  [72-87] 87 (07/15 1405) Resp:  [16] 16 (07/15 1405) BP: (108-129)/(67-92) 116/92 (07/15 1405) SpO2:  [99 %-100 %] 100 % (07/15 1405)  Intake/Output from previous day: 07/14 0701 - 07/15 0700 In: 2537.4 [P.O.:960; I.V.:1304.6; IV Piggyback:272.8] Out: 1600 [Urine:1600] Intake/Output this shift: Total I/O In: 240 [P.O.:240] Out: 200 [Urine:200]  Physical Exam: Work of breathing is normal.  Anticipated abdominal pain.  Foley out and difficulty voiding  Lab Results:  Results for orders placed or performed during the hospital encounter of 08/14/17 (from the past 48 hour(s))  CBC     Status: None   Collection Time: 08/16/17  4:07 AM  Result Value Ref Range   WBC 7.6 4.0 - 10.5 K/uL   RBC 4.20 3.87 - 5.11 MIL/uL   Hemoglobin 12.9 12.0 - 15.0 g/dL   HCT 40.6 36.0 - 46.0 %   MCV 96.7 78.0 - 100.0 fL   MCH 30.7 26.0 - 34.0 pg   MCHC 31.8 30.0 - 36.0 g/dL   RDW 14.6 11.5 - 15.5 %   Platelets 228 150 - 400 K/uL    Comment: Performed at [facility], [place] [address] [place], [place] [address]  Basic metabolic panel     Status: Abnormal   Collection Time: 08/16/17  4:07 AM  Result Value Ref Range   Sodium 141 135 - 145 mmol/L   Potassium 3.8 3.5 - 5.1 mmol/L   Chloride 109 98 - 111 mmol/L    Comment: Please note change in reference range.   CO2 26 22 - 32 mmol/L   Glucose, Bld 92 70 - 99 mg/dL    Comment: Please note change in reference range.   BUN 10 6 - 20 mg/dL    Comment: Please note change in reference range.   Creatinine, Ser 0.83 0.44 - 1.00 mg/dL   Calcium 8.5 (L) 8.9 - 10.3 mg/dL   GFR calc non Af Amer >60 >60 mL/min   GFR calc Af  Amer >60 >60 mL/min    Comment: (NOTE) The eGFR has been calculated using the CKD EPI equation. This calculation has not been validated in all clinical situations. eGFR's persistently <60 mL/min signify possible Chronic Kidney Disease.    Anion gap 6 5 - 15    Comment: Performed at [facility], [place] [address], [place], [place] [address]  Basic metabolic panel     Status: Abnormal   Collection Time: 08/17/17  5:13 AM  Result Value Ref Range   Sodium 139 135 - 145 mmol/L   Potassium 4.2 3.5 - 5.1 mmol/L   Chloride 107 98 - 111 mmol/L    Comment: Please note change in reference range.   CO2 26 22 - 32 mmol/L   Glucose, Bld 165 (H) 70 - 99 mg/dL    Comment: Please note change in reference range.   BUN 9 6 - 20 mg/dL    Comment: Please note change in reference range.   Creatinine, Ser 0.95 0.44 - 1.00 mg/dL   Calcium 8.3 (L) 8.9 - 10.3 mg/dL   GFR calc non Af Amer >60 >60 mL/min   GFR calc Af  Amer >60 >60 mL/min    Comment: (NOTE) The eGFR has been calculated using the CKD EPI equation. This calculation has not been validated in all clinical situations. eGFR's persistently <60 mL/min signify possible Chronic Kidney Disease.    Anion gap 6 5 - 15    Comment: Performed at [facility], [place] [address] [place], [place] [address]    Radiology/Results: No results found.  Anti-infectives: Anti-infectives (From admission, onward)   Start     Dose/Rate Route Frequency Ordered Stop   08/14/17 1800  ciprofloxacin (CIPRO) IVPB 400 mg     400 mg 200 mL/hr over 60 Minutes Intravenous Every 12 hours 08/14/17 1250 08/14/17 1928   08/14/17 0600  ciprofloxacin (CIPRO) IVPB 400 mg     400 mg 200 mL/hr over 60 Minutes Intravenous On call to O.R. 08/14/17 0544 08/14/17 0817      Assessment/Plan: Problem List: Patient Active Problem List   Diagnosis Date Noted  . S/P repair of ventral hernia 08/14/2017  . Incarcerated incisional hernia s/p lap  repair w mesh 08/14/2017 08/14/2017  . Precordial chest pain 05/04/2017  . Infective otitis externa of right ear   . 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 Malignant otitis externa 11/07/2015  . Acute malignant otitis externa of right ear   . 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 Abdominal pain 08/16/2015  . Generalized abdominal pain   . Intractable cyclical vomiting with nausea   . Pericarditis   . Pericarditis, acute 05/03/2010  . Obesity 05/03/2010  . Diastolic congestive heart failure ([place])   . Hyperlipemia   . Hypothyroidism   . History of depression   . History of migraine   . Rosacea   . Anxiety     Not able to discharge today.   3 Days Post-Op    LOS: 3 days   Matt B. Hassell Done, MD, [facility] [phone] beeper [phone]  08/17/2017 2:59 PM

## 2017-08-18 MED ORDER — HYDROCODONE-ACETAMINOPHEN 5-325 MG PO TABS: 1.0000 | ORAL_TABLET | ORAL | 0 refills | Status: DC | PRN

## 2017-08-18 MED ORDER — HYDROCODONE-ACETAMINOPHEN 5-325 MG PO TABS
1.0000 | ORAL_TABLET | ORAL | Status: DC | PRN
  Administered 2017-08-18: 1 via ORAL
  Filled 2017-08-18: qty 1

## 2017-08-18 NOTE — Discharge Summary (Signed)
Physician Discharge Summary  Patient ID: Cynthia [name] MRN: [id number] DOB/AGE: 50-13-1967 50 y.o.  PCP: Josetta Huddle, MD  Admit date: 08/14/2017 Discharge date: 08/18/2017  Admission Diagnoses:  Ventral hernia  Discharge Diagnoses:  Ventral hernia and floppy cecum  Principal Problem:   Incarcerated incisional hernia s/p lap repair w mesh 08/14/2017 Active Problems:   Hyperlipemia   History of migraine   Anxiety   Intractable cyclical vomiting with nausea   Cecal bascule (H[facility])   S/P repair of ventral hernia   Surgery:  Lap assisted cecopexy and ventral hernia repair with mesh and lateral release  Discharged Condition: improved  Hospital Course:   Had surgery on Friday.  Had ileus and urinary retention post op. Pain control was an issue.  Got better over the weekend.  Foley removed Monday and ready for discharge on Tuesday after eating solids.  Consults: none  Significant Diagnostic Studies: none    Discharge Exam: Blood pressure 90/62, pulse 69, temperature 98.3 F (36.8 C), temperature source Oral, resp. rate 18, height 5' 7.5" (1.715 m), weight 107.5 kg (237 lb), SpO2 99 %. Incision covered with honeycomb dressing  Disposition: Discharge disposition: 01-Home or Self Care       Discharge Instructions    Call MD for:  persistant nausea and vomiting   Complete by:  As directed    Call MD for:  severe uncontrolled pain   Complete by:  As directed    Diet - low sodium heart healthy   Complete by:  As directed    Discharge instructions   Complete by:  As directed    Nurse only visit next Monday to have staples removed   Increase activity slowly   Complete by:  As directed      Allergies as of 08/18/2017      Reactions   Amoxicillin Other (See Comments)   Face, eyes, chest turned red   Ampicillin Other (See Comments)   SERUM SICKNESS REACTION Has patient had a PCN reaction causing immediate rash, facial/tongue/throat swelling, SOB or lightheadedness with  hypotension: Yes Has patient had a PCN reaction causing severe rash involving mucus membranes or skin necrosis: No Has patient had a PCN reaction that required hospitalization: No Has patient had a PCN reaction occurring within the last 10 years: No If all of the above answers are "NO", then may proceed with Cephalosporin use.   Apple Fruit Extract Itching   Mouth itches inside, but no breathing impairment; treats with Benadryl   Cherry Extract Itching   Mouth itches inside, but no breathing impairment; treats with Benadryl   Fruit & Vegetable Daily [nutritional Supplements] Itching   Mouth itches inside, but no breathing impairment; treats with Benadryl   Imitrex [sumatriptan] Other (See Comments)   Chest Pain   Other Itching   Feline dander = Mouth itches inside, but no breathing impairment; treats with Benadryl   Peach [prunus Persica] Itching   Mouth itches inside, but no breathing impairment; treats with Benadryl   Strawberry Extract Itching   Mouth itches inside, but no breathing impairment; treats with Benadryl   Zoloft [sertraline Hcl] Other (See Comments)   Causes hyperactivity      Medication List    STOP taking these medications   clindamycin 1 % gel Commonly known as:  CLINDAGEL     TAKE these medications   acetaminophen 500 MG tablet Commonly known as:  TYLENOL Take 1,000 mg by mouth daily as needed for moderate pain or headache.  ALPRAZolam 1 MG tablet Commonly known as:  XANAX Take 1 mg by mouth 3 (three) times daily as needed for anxiety or sleep.   cholecalciferol 1000 units tablet Commonly known as:  VITAMIN D Take 4,000 Units by mouth every evening.   estrogens-methylTEST 1.25-2.5 MG tablet Commonly known as:  ESTRATEST Take 1 tablet by mouth at bedtime.   HYDROcodone-acetaminophen 5-325 MG tablet Commonly known as:  NORCO/VICODIN Take 1 tablet by mouth every 4 (four) hours as needed for moderate pain.   lamoTRIgine 150 MG tablet Commonly known  as:  LAMICTAL Take 150 mg by mouth at bedtime.   levothyroxine 75 MCG tablet Commonly known as:  SYNTHROID, LEVOTHROID Take 75 mcg by mouth at bedtime.   lurasidone 40 MG Tabs tablet Commonly known as:  LATUDA Take 40 mg by mouth daily with supper.   metoprolol tartrate 25 MG tablet Commonly known as:  LOPRESSOR Take 25 mg by mouth at bedtime.   QUEtiapine 200 MG 24 hr tablet Commonly known as:  SEROQUEL XR Take 200 mg by mouth at bedtime.   rizatriptan 10 MG tablet Commonly known as:  MAXALT Take 10 mg by mouth daily as needed for migraine. May repeat in 2 hours if needed   rosuvastatin 40 MG tablet Commonly known as:  CRESTOR Take 40 mg by mouth every Monday, Wednesday, and Friday at 8 PM.   zolpidem 10 MG tablet Commonly known as:  AMBIEN Take 10 mg by mouth at bedtime as needed for sleep.   zonisamide 100 MG capsule Commonly known as:  ZONEGRAN Take 200 mg by mouth at bedtime      Follow-up Information    Johnathan Hausen, MD. Schedule an appointment as soon as possible for a visit in 3 week(s).   Specialty:  General Surgery Why:  Have nurses remove staples early next week Contact information: [place] [address] [phone]           Signed: Pedro Earls 08/18/2017, 3:18 PM

## 2017-08-18 NOTE — Progress Notes (Signed)
Patient ID: Cynthia [name], female   DOB: [birth year], 52 y.o.   MRN: [id number] [facility] Progress Note:   4 Days Post-Op  Subjective: Mental status is clearer and much better Objective: Vital signs in last 24 hours: Temp:  [98 F (36.7 C)-98.3 F (36.8 C)] 98.3 F (36.8 C) (07/16 0604) Pulse Rate:  [69-87] 69 (07/15 2120) Resp:  [16-18] 18 (07/16 0604) BP: (90-127)/(62-92) 90/62 (07/16 0604) SpO2:  [99 %-100 %] 99 % (07/15 2120)  Intake/Output from previous day: 07/15 0701 - 07/16 0700 In: 2611.7 [P.O.:1220; I.V.:1191.7; IV Piggyback:200] Out: 700 [Urine:700] Intake/Output this shift: No intake/output data recorded.  Physical Exam: Work of breathing is normal.  Voiding better.  Taking clears  Lab Results:  Results for orders placed or performed during the hospital encounter of 08/14/17 (from the past 48 hour(s))  Basic metabolic panel     Status: Abnormal   Collection Time: 08/17/17  5:13 AM  Result Value Ref Range   Sodium 139 135 - 145 mmol/L   Potassium 4.2 3.5 - 5.1 mmol/L   Chloride 107 98 - 111 mmol/L    Comment: Please note change in reference range.   CO2 26 22 - 32 mmol/L   Glucose, Bld 165 (H) 70 - 99 mg/dL    Comment: Please note change in reference range.   BUN 9 6 - 20 mg/dL    Comment: Please note change in reference range.   Creatinine, Ser 0.95 0.44 - 1.00 mg/dL   Calcium 8.3 (L) 8.9 - 10.3 mg/dL   GFR calc non Af Amer >60 >60 mL/min   GFR calc Af Amer >60 >60 mL/min    Comment: (NOTE) The eGFR has been calculated using the CKD EPI equation. This calculation has not been validated in all clinical situations. eGFR's persistently <60 mL/min signify possible Chronic Kidney Disease.    Anion gap 6 5 - 15    Comment: Performed at [facility], [place] [address], [place], [place] [address]    Radiology/Results: No results found.  Anti-infectives: Anti-infectives (From admission, onward)   Start     Dose/Rate Route  Frequency Ordered Stop   08/14/17 1800  ciprofloxacin (CIPRO) IVPB 400 mg     400 mg 200 mL/hr over 60 Minutes Intravenous Every 12 hours 08/14/17 1250 08/14/17 1928   08/14/17 0600  ciprofloxacin (CIPRO) IVPB 400 mg     400 mg 200 mL/hr over 60 Minutes Intravenous On call to O.R. 08/14/17 0544 08/14/17 0817      Assessment/Plan: Problem List: Patient Active Problem List   Diagnosis Date Noted  . S/P repair of ventral hernia 08/14/2017  . Incarcerated incisional hernia s/p lap repair w mesh 08/14/2017 08/14/2017  . Precordial chest pain 05/04/2017  . Infective otitis externa of right ear   . 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 Malignant otitis externa 11/07/2015  . Acute malignant otitis externa of right ear   . 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 Abdominal pain 08/16/2015  . Generalized abdominal pain   . Intractable cyclical vomiting with nausea   . Pericarditis   . Pericarditis, acute 05/03/2010  . Obesity 05/03/2010  . Diastolic congestive heart failure ([place])   . Hyperlipemia   . Hypothyroidism   . History of depression   . History of migraine   . Rosacea   . Anxiety     Advance diet and assess for discharge later tonight or in the AM 4 Days Post-Op  LOS: 4 days   Matt B. Hassell Done, MD, [facility] [phone] beeper [phone]  08/18/2017 12:22 PM

## 2017-08-18 NOTE — Discharge Instructions (Signed)
 Ventral Hernia A ventral hernia is a bulge of tissue from inside the abdomen that pushes through a weak area of the muscles that form the front wall of the abdomen. The tissues inside the abdomen are inside a sac (peritoneum). These tissues include the small intestine, large intestine, and the fatty tissue that covers the intestines (omentum). Sometimes, the bulge that forms a hernia contains intestines. Other hernias contain only fat. Ventral hernias do not go away without surgical treatment. There are several types of ventral hernias. You may have:  A hernia at an incision site from previous abdominal surgery (incisional hernia).  A hernia just above the belly button (epigastric hernia), or at the belly button (umbilical hernia). These types of hernias can develop from heavy lifting or straining.  A hernia that comes and goes (reducible hernia). It may be visible only when you lift or strain. This type of hernia can be pushed back into the abdomen (reduced).  A hernia that traps abdominal tissue inside the hernia (incarcerated hernia). This type of hernia does not reduce.  A hernia that cuts off blood flow to the tissues inside the hernia (strangulated hernia). The tissues can start to die if this happens. This is a very painful bulge that cannot be reduced. A strangulated hernia is a medical emergency.  What are the causes? This condition is caused by abdominal tissue putting pressure on an area of weakness in the abdominal muscles. What increases the risk? The following factors may make you more likely to develop this condition:  Being female.  Being 60 or older.  Being overweight or obese.  Having had previous abdominal surgery, especially if there was an infection after surgery.  Having had an injury to the abdominal wall.  Having had several pregnancies.  Having a buildup of fluid inside the abdomen (ascites).  What are the signs or symptoms? The only symptom of a ventral  hernia may be a painless bulge in the abdomen. A reducible hernia may be visible only when you strain, cough, or lift. Other symptoms may include:  Dull pain.  A feeling of pressure.  Signs and symptoms of a strangulated hernia may include:  Increasing pain.  Nausea and vomiting.  Pain when pressing on the hernia.  The skin over the hernia turning red or purple.  Constipation.  Blood in the stool (feces).  How is this diagnosed? This condition may be diagnosed based on:  Your symptoms.  Your medical history.  A physical exam. You may be asked to cough or strain while standing. These actions increase the pressure inside your abdomen and force the hernia through the opening in your muscles. Your health care provider may try to reduce the hernia by pressing on it.  Imaging studies, such as an ultrasound or CT scan.  How is this treated? This condition is treated with surgery. If you have a strangulated hernia, surgery is done as soon as possible. If your hernia is small and not incarcerated, you may be asked to lose some weight before surgery. Follow these instructions at home:  Follow instructions from your health care provider about eating or drinking restrictions.  If you are overweight, your health care provider may recommend that you increase your activity level and eat a healthier diet.  Do not lift anything that is heavier than 10 lb (4.5 kg).  Return to your normal activities as told by your health care provider. Ask your health care provider what activities are safe for you. You   may need to avoid activities that increase pressure on your hernia.  Take over-the-counter and prescription medicines only as told by your health care provider.  Keep all follow-up visits as told by your health care provider. This is important. Contact a health care provider if:  Your hernia gets larger.  Your hernia becomes painful. Get help right away if:  Your hernia becomes  increasingly painful.  You have pain along with any of the following: ? Changes in skin color in the area of the hernia. ? Nausea. ? Vomiting. ? Fever. Summary  A ventral hernia is a bulge of tissue from inside the abdomen that pushes through a weak area of the muscles that form the front wall of the abdomen.  This condition is treated with surgery, which may be urgent depending on your hernia.  Do not lift anything that is heavier than 10 lb (4.5 kg), and follow activity instructions from your health care provider. This information is not intended to replace advice given to you by your health care provider. Make sure you discuss any questions you have with your health care provider. Document Released: 01/07/2012 Document Revised: 09/07/2015 Document Reviewed: 09/07/2015 Elsevier Interactive Patient Education  2018 [employer]  

## 2017-08-18 NOTE — Progress Notes (Signed)
Discussed with patient discharge instructions she verbalized agreement and understanding. Patient to go home with all belongings in private vehicle.

## 2017-10-24 DIAGNOSIS — G43009 Migraine without aura, not intractable, without status migrainosus: Secondary | ICD-10-CM | POA: Insufficient documentation

## 2017-11-30 ENCOUNTER — Other Ambulatory Visit: Payer: Self-pay | Admitting: Internal Medicine

## 2017-11-30 DIAGNOSIS — Z1231 Encounter for screening mammogram for malignant neoplasm of breast: Secondary | ICD-10-CM

## 2018-01-28 ENCOUNTER — Ambulatory Visit
Admission: RE | Admit: 2018-01-28 | Discharge: 2018-01-28 | Disposition: A | Discharge status: Home / Self Care | Payer: 59 | Source: Ambulatory Visit | Attending: Internal Medicine | Admitting: Internal Medicine

## 2018-01-28 DIAGNOSIS — Z1231 Encounter for screening mammogram for malignant neoplasm of breast: Secondary | ICD-10-CM

## 2018-02-10 ENCOUNTER — Other Ambulatory Visit: Payer: Self-pay | Admitting: Internal Medicine

## 2018-02-10 DIAGNOSIS — Z13820 Encounter for screening for osteoporosis: Secondary | ICD-10-CM

## 2018-02-11 ENCOUNTER — Ambulatory Visit
Admission: RE | Admit: 2018-02-11 | Discharge: 2018-02-11 | Disposition: A | Discharge status: Home / Self Care | Payer: 59 | Source: Ambulatory Visit | Attending: Internal Medicine | Admitting: Internal Medicine

## 2018-02-11 DIAGNOSIS — Z13820 Encounter for screening for osteoporosis: Secondary | ICD-10-CM

## 2018-02-24 ENCOUNTER — Encounter (INDEPENDENT_AMBULATORY_CARE_PROVIDER_SITE_OTHER): Payer: 59 | Admitting: Ophthalmology

## 2018-02-24 DIAGNOSIS — D3131 Benign neoplasm of right choroid: Secondary | ICD-10-CM | POA: Diagnosis not present

## 2018-02-24 DIAGNOSIS — H2513 Age-related nuclear cataract, bilateral: Secondary | ICD-10-CM | POA: Diagnosis not present

## 2018-02-24 DIAGNOSIS — H43813 Vitreous degeneration, bilateral: Secondary | ICD-10-CM | POA: Diagnosis not present

## 2018-05-20 IMAGING — CT CT ENTEROGRAPHY (ABD-PELV W/ CM)
2 of 6 series · 16 of 46 positions shown, 18 images · IV contrast (ISOVUE 300)
Comparison: 08/16/2015

CLINICAL DATA: Right abdominal pain, recent hospitalization for
small bowel obstruction

EXAM:
CT ABDOMEN AND PELVIS WITH CONTRAST (ENTEROGRAPHY)
TECHNIQUE: Multidetector CT of the abdomen and pelvis during bolus
administration of intravenous contrast. Negative oral contrast was
given.
CONTRAST:  100mL 8QGMEW-CWW IOPAMIDOL (8QGMEW-CWW) INJECTION 61%

[Series 4: entero thins · axial · 0.83mm/px · z∈[-558,-18]mm · 13 of 302 slices shown, 15 images]
[im 16/302  soft-tissue]
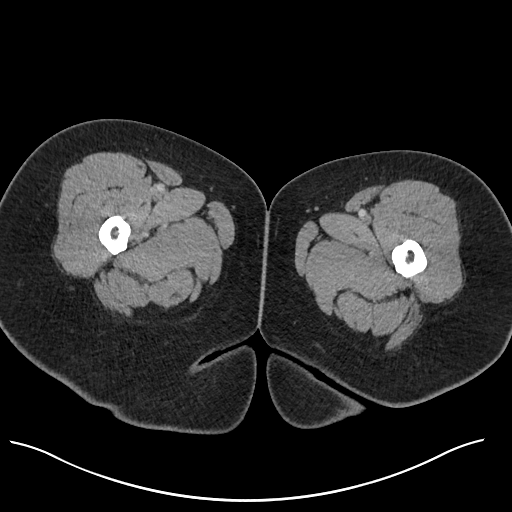
[im 16/302  bone]
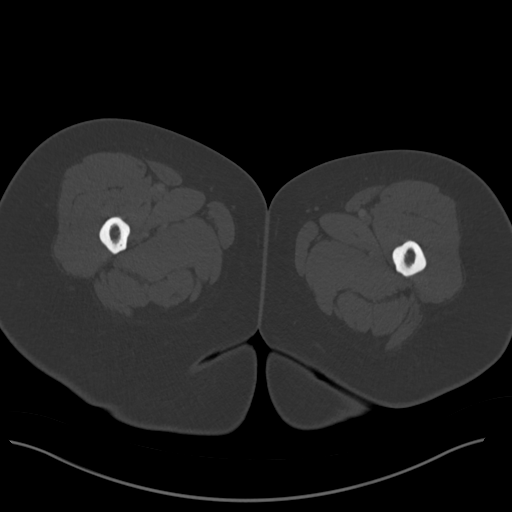
[im 48/302  soft-tissue]
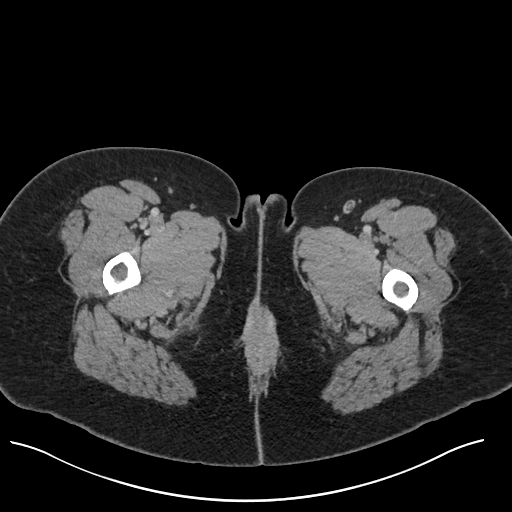
[im 64/302  soft-tissue]
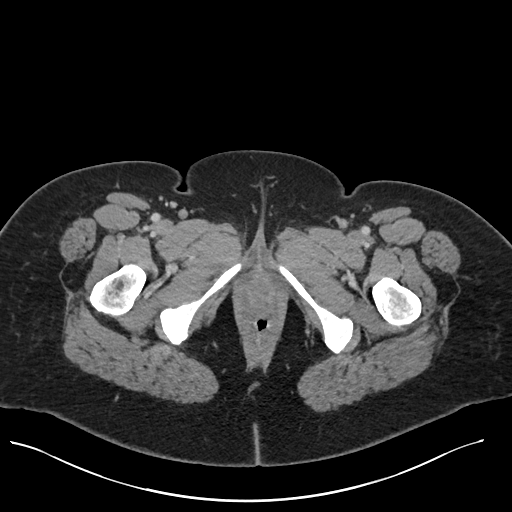
[im 80/302  soft-tissue]
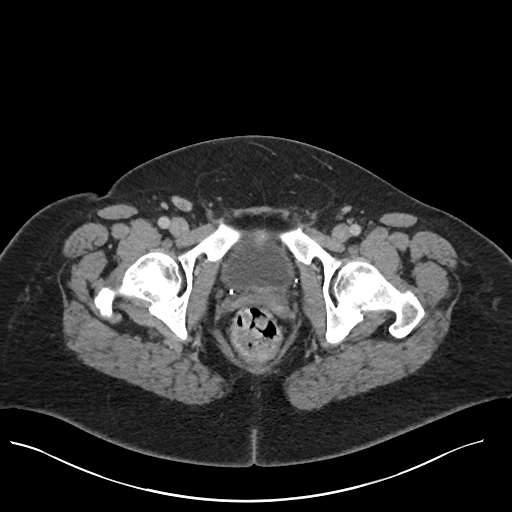
[im 111/302  soft-tissue]
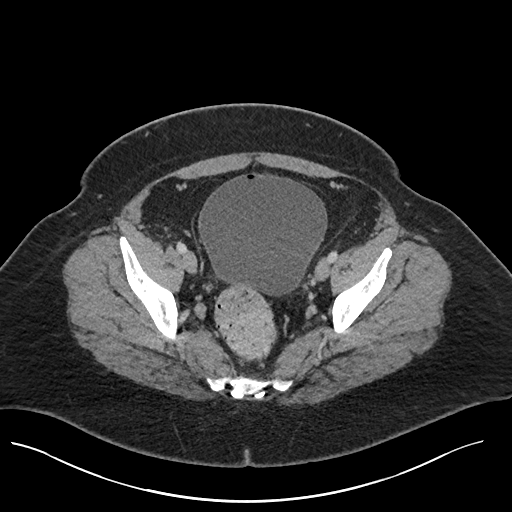
[im 127/302  soft-tissue]
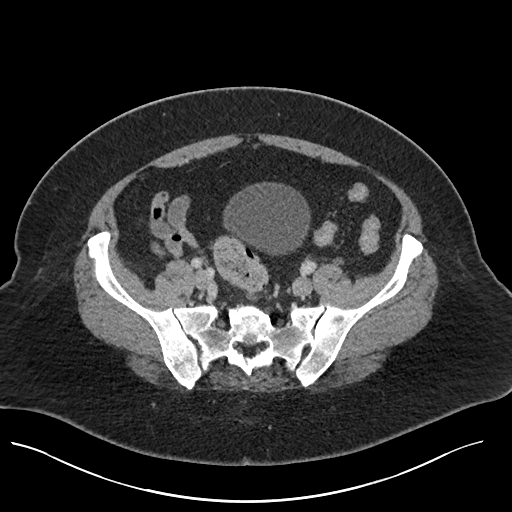
[im 159/302  soft-tissue]
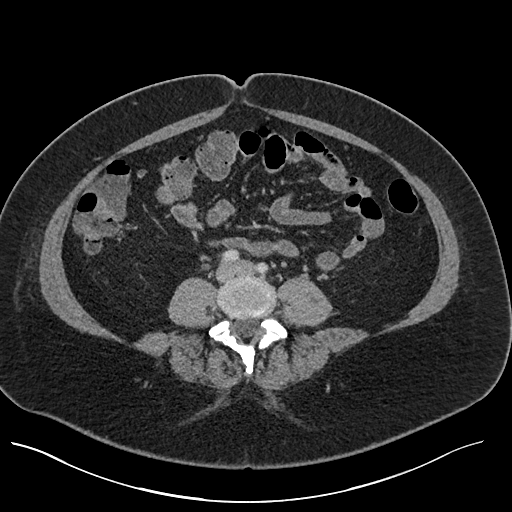
[im 175/302  soft-tissue]
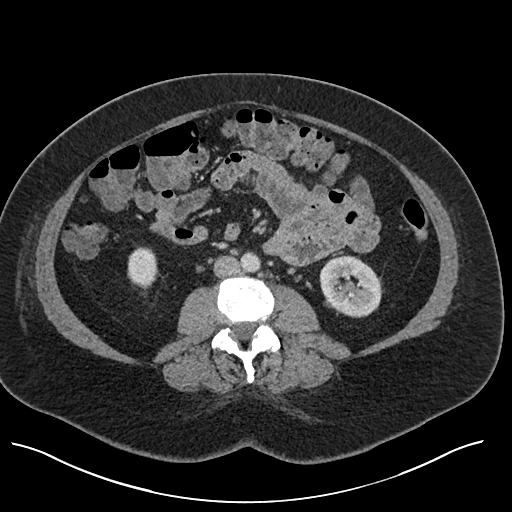
[im 191/302  soft-tissue]
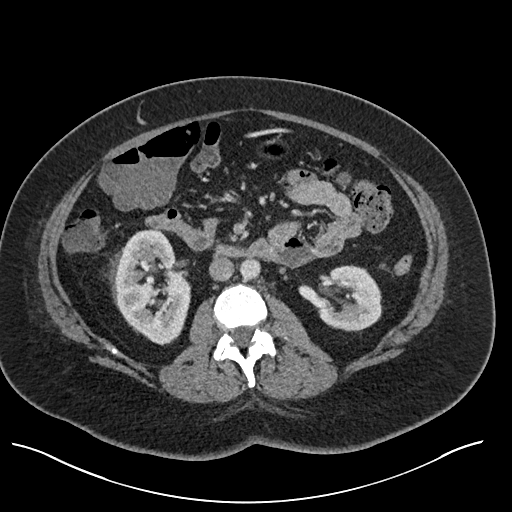
[im 191/302  bone]
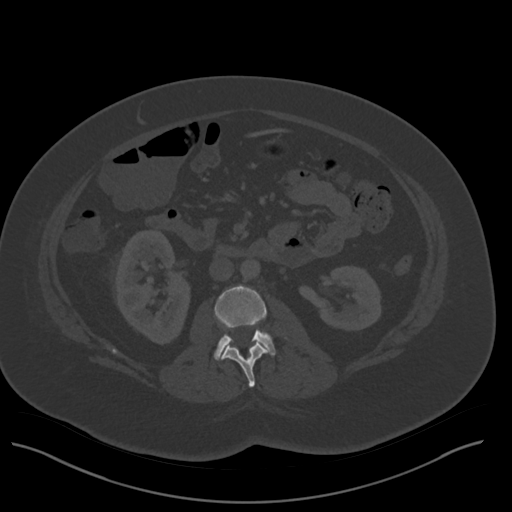
[im 222/302  soft-tissue]
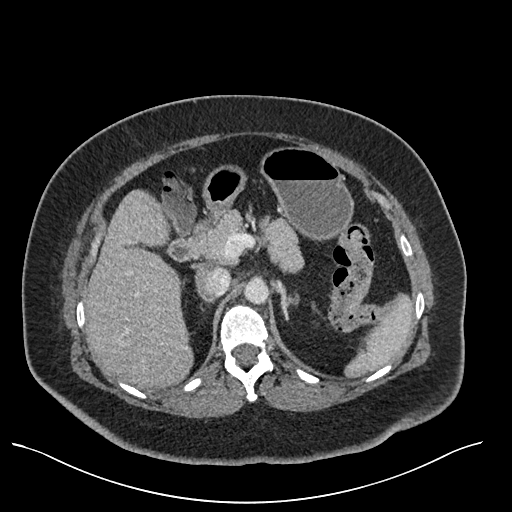
[im 238/302  soft-tissue]
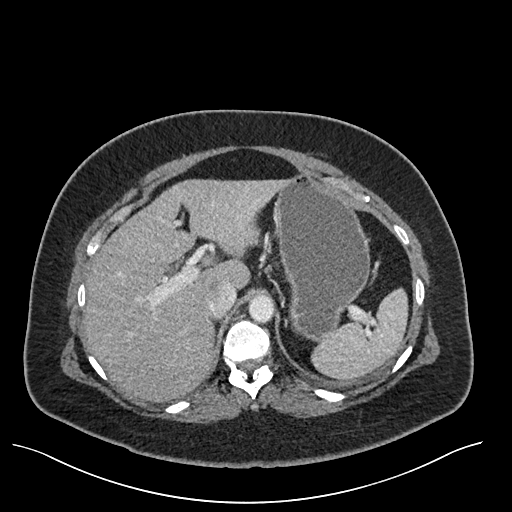
[im 254/302  soft-tissue]
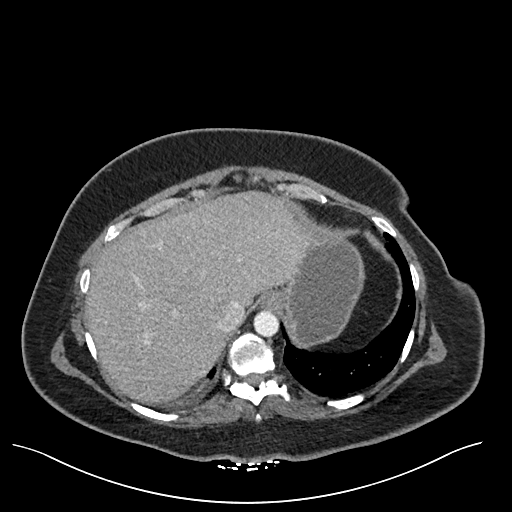
[im 286/302  soft-tissue]
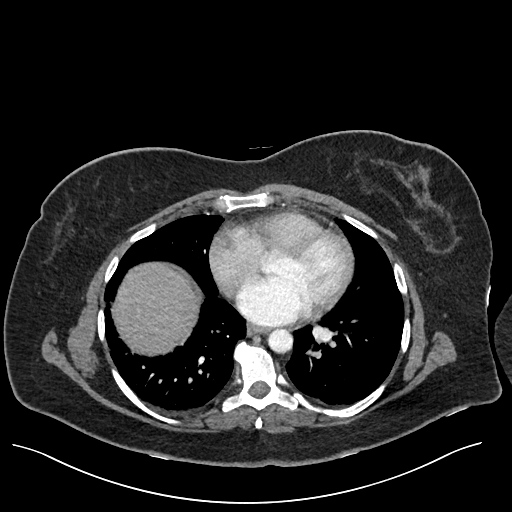

[Series 6: coronal · coronal · 0.88mm/px · 3 of 105 slices shown]
[im 35/105  soft-tissue]
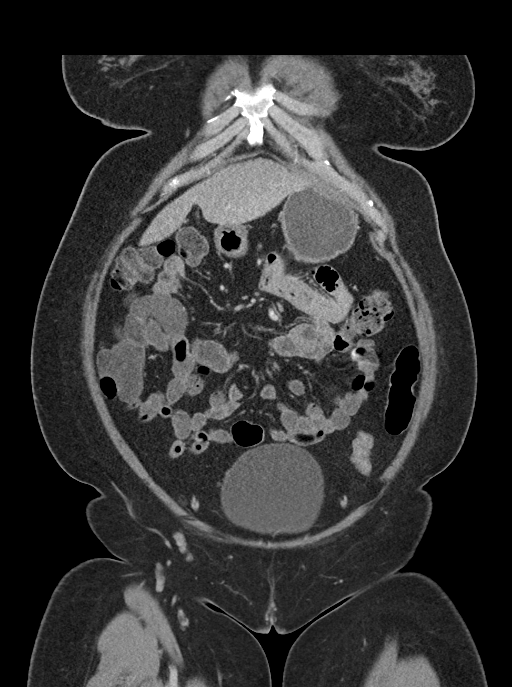
[im 47/105  soft-tissue]
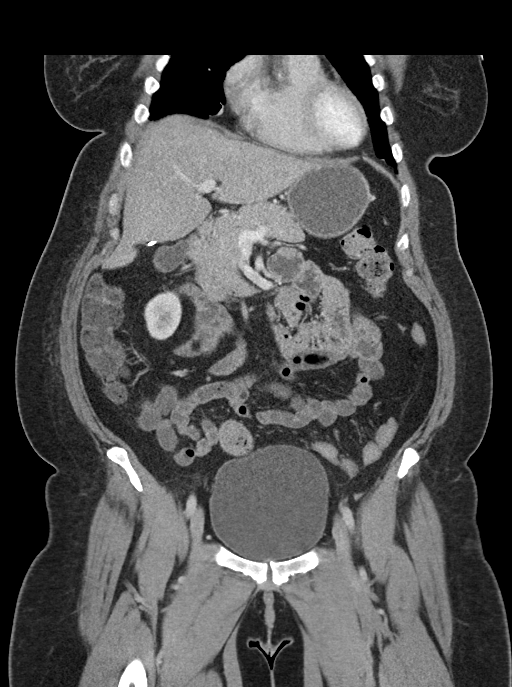
[im 58/105  soft-tissue]
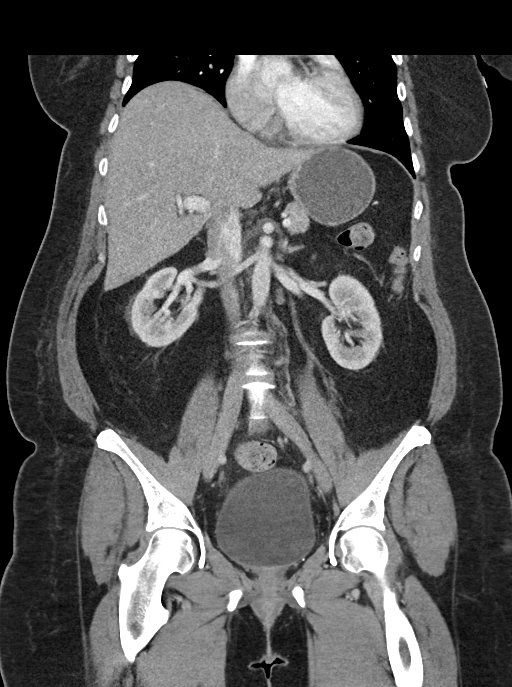

[16 of 46 positions shown; findings below may reference images not displayed]

FINDINGS: Lower chest:  Trace bilateral pleural effusions.

Hepatobiliary: Liver is within normal limits. No
suspicious/enhancing hepatic lesions.

Status post cholecystectomy. No intrahepatic or extrahepatic ductal
dilatation.

Pancreas: Within normal limits.

Spleen: Within normal limits.

Adrenals/Urinary Tract: Adrenal glands are within normal limits.

Malrotated right kidney. Left kidney is within normal limits. No
hydronephrosis.

Bladder is notable for a tiny focus of nondependent gas (series 2/
image 37), but is otherwise within normal limits.

Stomach/Bowel: Stomach is within normal limits.

No evidence of bowel obstruction.

Normal appendix (series 2/ image 64).

Mildly prominent rectal stool burden (series 2/image 80).

Vascular/Lymphatic: No evidence of abdominal aortic aneurysm.

No suspicious abdominopelvic lymphadenopathy.

Reproductive: Status post hysterectomy.

No adnexal masses.

Other: No abdominopelvic ascites.

Musculoskeletal: Degenerative changes of the visualized
thoracolumbar spine.
IMPRESSION: No evidence of bowel obstruction.  Normal appendix.

Status post cholecystectomy and hysterectomy.

Trace bilateral pleural effusions.

No CT findings to account for the patient's abdominal pain.

## 2018-07-02 ENCOUNTER — Other Ambulatory Visit: Payer: Self-pay | Admitting: Internal Medicine

## 2018-07-02 ENCOUNTER — Other Ambulatory Visit (HOSPITAL_COMMUNITY): Payer: Self-pay | Admitting: Internal Medicine

## 2018-07-02 DIAGNOSIS — R82998 Other abnormal findings in urine: Secondary | ICD-10-CM

## 2018-07-02 DIAGNOSIS — R109 Unspecified abdominal pain: Secondary | ICD-10-CM

## 2018-07-02 DIAGNOSIS — Z87442 Personal history of urinary calculi: Secondary | ICD-10-CM

## 2018-07-06 ENCOUNTER — Ambulatory Visit (HOSPITAL_COMMUNITY): Payer: 59 | Attending: Internal Medicine

## 2018-07-06 ENCOUNTER — Encounter (HOSPITAL_COMMUNITY): Payer: Self-pay

## 2018-07-12 ENCOUNTER — Other Ambulatory Visit: Payer: 59

## 2018-07-16 ENCOUNTER — Other Ambulatory Visit: Payer: 59

## 2018-11-22 DIAGNOSIS — M25552 Pain in left hip: Secondary | ICD-10-CM | POA: Insufficient documentation

## 2018-11-22 DIAGNOSIS — M25561 Pain in right knee: Secondary | ICD-10-CM | POA: Insufficient documentation

## 2018-12-14 ENCOUNTER — Other Ambulatory Visit: Payer: Self-pay | Admitting: Internal Medicine

## 2018-12-14 DIAGNOSIS — Z1231 Encounter for screening mammogram for malignant neoplasm of breast: Secondary | ICD-10-CM

## 2019-01-21 DIAGNOSIS — M79671 Pain in right foot: Secondary | ICD-10-CM | POA: Insufficient documentation

## 2019-02-03 ENCOUNTER — Other Ambulatory Visit: Payer: Self-pay

## 2019-02-03 ENCOUNTER — Ambulatory Visit
Admission: RE | Admit: 2019-02-03 | Discharge: 2019-02-03 | Disposition: A | Discharge status: Home / Self Care | Payer: 59 | Source: Ambulatory Visit | Attending: Internal Medicine | Admitting: Internal Medicine

## 2019-02-03 DIAGNOSIS — Z1231 Encounter for screening mammogram for malignant neoplasm of breast: Secondary | ICD-10-CM

## 2019-06-28 DIAGNOSIS — F411 Generalized anxiety disorder: Secondary | ICD-10-CM | POA: Insufficient documentation

## 2019-06-28 DIAGNOSIS — R41844 Frontal lobe and executive function deficit: Secondary | ICD-10-CM | POA: Insufficient documentation

## 2019-12-15 ENCOUNTER — Other Ambulatory Visit: Payer: Self-pay | Admitting: Family Medicine

## 2019-12-15 DIAGNOSIS — N6459 Other signs and symptoms in breast: Secondary | ICD-10-CM

## 2019-12-16 DIAGNOSIS — M7660 Achilles tendinitis, unspecified leg: Secondary | ICD-10-CM | POA: Insufficient documentation

## 2020-01-16 ENCOUNTER — Ambulatory Visit
Admission: RE | Admit: 2020-01-16 | Discharge: 2020-01-16 | Disposition: A | Discharge status: Home / Self Care | Payer: 59 | Source: Ambulatory Visit | Attending: Family Medicine | Admitting: Family Medicine

## 2020-01-16 ENCOUNTER — Other Ambulatory Visit: Payer: Self-pay | Admitting: Family Medicine

## 2020-01-16 ENCOUNTER — Other Ambulatory Visit: Payer: Self-pay

## 2020-01-16 DIAGNOSIS — Z1231 Encounter for screening mammogram for malignant neoplasm of breast: Secondary | ICD-10-CM

## 2020-01-16 DIAGNOSIS — N6459 Other signs and symptoms in breast: Secondary | ICD-10-CM

## 2020-02-16 ENCOUNTER — Other Ambulatory Visit: Payer: Self-pay

## 2020-02-16 ENCOUNTER — Ambulatory Visit
Admission: RE | Admit: 2020-02-16 | Discharge: 2020-02-16 | Disposition: A | Discharge status: Home / Self Care | Payer: 59 | Source: Ambulatory Visit | Attending: Family Medicine | Admitting: Family Medicine

## 2020-02-16 DIAGNOSIS — Z1231 Encounter for screening mammogram for malignant neoplasm of breast: Secondary | ICD-10-CM

## 2020-02-24 ENCOUNTER — Other Ambulatory Visit: Payer: Self-pay | Admitting: Nurse Practitioner

## 2020-02-24 ENCOUNTER — Other Ambulatory Visit: Payer: Self-pay

## 2020-02-24 ENCOUNTER — Ambulatory Visit
Admission: RE | Admit: 2020-02-24 | Discharge: 2020-02-24 | Disposition: A | Discharge status: Home / Self Care | Payer: 59 | Source: Ambulatory Visit | Attending: Nurse Practitioner | Admitting: Nurse Practitioner

## 2020-02-24 DIAGNOSIS — Z01818 Encounter for other preprocedural examination: Secondary | ICD-10-CM

## 2020-05-01 DIAGNOSIS — N3941 Urge incontinence: Secondary | ICD-10-CM | POA: Insufficient documentation

## 2020-07-16 DIAGNOSIS — M7661 Achilles tendinitis, right leg: Secondary | ICD-10-CM | POA: Insufficient documentation

## 2020-08-09 ENCOUNTER — Encounter (HOSPITAL_COMMUNITY): Payer: Self-pay

## 2020-08-09 ENCOUNTER — Emergency Department (HOSPITAL_COMMUNITY): Payer: 59

## 2020-08-09 ENCOUNTER — Emergency Department (HOSPITAL_COMMUNITY)
Admission: EM | Admit: 2020-08-09 | Discharge: 2020-08-09 | Disposition: A | Discharge status: Home / Self Care | Payer: 59 | Attending: Emergency Medicine | Admitting: Emergency Medicine

## 2020-08-09 ENCOUNTER — Other Ambulatory Visit: Payer: Self-pay

## 2020-08-09 DIAGNOSIS — Z79899 Other long term (current) drug therapy: Secondary | ICD-10-CM | POA: Diagnosis not present

## 2020-08-09 DIAGNOSIS — R110 Nausea: Secondary | ICD-10-CM | POA: Diagnosis not present

## 2020-08-09 DIAGNOSIS — R109 Unspecified abdominal pain: Secondary | ICD-10-CM | POA: Diagnosis not present

## 2020-08-09 DIAGNOSIS — I5030 Unspecified diastolic (congestive) heart failure: Secondary | ICD-10-CM | POA: Diagnosis not present

## 2020-08-09 DIAGNOSIS — K5900 Constipation, unspecified: Secondary | ICD-10-CM | POA: Diagnosis present

## 2020-08-09 DIAGNOSIS — E039 Hypothyroidism, unspecified: Secondary | ICD-10-CM | POA: Insufficient documentation

## 2020-08-09 LAB — COMPREHENSIVE METABOLIC PANEL
ALT: 18 U/L (ref 0–44)
AST: 36 U/L (ref 15–41)
Albumin: 4.1 g/dL (ref 3.5–5.0)
Alkaline Phosphatase: 151 U/L — ABNORMAL HIGH (ref 38–126)
Anion gap: 10 (ref 5–15)
BUN: 9 mg/dL (ref 6–20)
CO2: 27 mmol/L (ref 22–32)
Calcium: 9.6 mg/dL (ref 8.9–10.3)
Chloride: 101 mmol/L (ref 98–111)
Creatinine, Ser: 1.11 mg/dL — ABNORMAL HIGH (ref 0.44–1.00)
GFR, Estimated: 59 mL/min — ABNORMAL LOW (ref >60)
Glucose, Bld: 109 mg/dL — ABNORMAL HIGH (ref 70–99)
Potassium: 3.8 mmol/L (ref 3.5–5.1)
Sodium: 138 mmol/L (ref 135–145)
Total Bilirubin: 1.2 mg/dL (ref 0.3–1.2)
Total Protein: 8 g/dL (ref 6.5–8.1)

## 2020-08-09 LAB — CBC
HCT: 45.4 % (ref 36.0–46.0)
Hemoglobin: 14.7 g/dL (ref 12.0–15.0)
MCH: 30.4 pg (ref 26.0–34.0)
MCHC: 32.4 g/dL (ref 30.0–36.0)
MCV: 93.8 fL (ref 80.0–100.0)
Platelets: 280 10*3/uL (ref 150–400)
RBC: 4.84 MIL/uL (ref 3.87–5.11)
RDW: 13.1 % (ref 11.5–15.5)
WBC: 10.8 10*3/uL — ABNORMAL HIGH (ref 4.0–10.5)
nRBC: 0 % (ref 0.0–0.2)

## 2020-08-09 LAB — LIPASE, BLOOD: Lipase: 28 U/L (ref 11–51)

## 2020-08-09 MED ORDER — SODIUM CHLORIDE 0.9 % IV BOLUS
1000.0000 mL | Freq: Once | INTRAVENOUS | Status: AC
  Administered 2020-08-09: 1000 mL via INTRAVENOUS

## 2020-08-09 MED ORDER — IOHEXOL 350 MG/ML SOLN
100.0000 mL | Freq: Once | INTRAVENOUS | Status: AC | PRN
  Administered 2020-08-09: 80 mL via INTRAVENOUS

## 2020-08-09 MED ORDER — ONDANSETRON HCL 4 MG/2ML IJ SOLN
4.0000 mg | Freq: Once | INTRAMUSCULAR | Status: AC
  Administered 2020-08-09: 4 mg via INTRAVENOUS
  Filled 2020-08-09: qty 2

## 2020-08-09 NOTE — ED Notes (Signed)
Patient went to the bathroom however could not give a urine sample

## 2020-08-09 NOTE — ED Notes (Signed)
PT STATED THAT SHE IS NOT ABLE TO URINATE.   WILL TRY AGAIN AFTER FLUIDS.

## 2020-08-09 NOTE — Discharge Instructions (Addendum)
Your lab work and CT scan today were all reassuring.  I do not see a significant buildup of stool in your colon.  In addition to using MiraLAX please begin taking a fiber supplement daily.  The main ingredient in your fiber supplement should be psyllium husk, can use something like Metamucil.  Follow-up with your doctor next week as planned.  Return for new or worsening symptoms.

## 2020-08-09 NOTE — ED Provider Notes (Signed)
[facility] DEPT Provider Note   CSN: [id number] Arrival date & time: 08/09/20  1333     History Chief Complaint  Patient presents with   Abdominal Pain    Cynthia [name] is a 55 y.o. female with PMHx HLD, Anxiety, Hypothyroidism, CHF with EF 55-60%, and SBO who presents to the ED today with complaint of constipation for the past 3 weeks. Pt reports that she has not had a good solid BM in 3 weeks. She has a hx of SBO and was told if she ever felt constipated to drink a capful of miralax every 2 hours up until she had a BM. She did this over the weekend and is now having a small amount of diarrhea; mostly present whenever she coughs, yawns, or strains. She is passing a small amount of gas as well however not as much as normal. She also complains of right sided abdominal pain for the past 10 days with associated nausea. No vomiting. Pt reports that anytime she burps her burps smell feculent. She reports similar issues in the past with SBO. PSHx includes cholecystectomy, appendectomy, hysterectomy and bilateral salpingectomy, hernia repair s/p incarcerated hernia.   The history is provided by the patient, medical records and the spouse.      Past Medical History:  Diagnosis Date   Allergy    Anxiety    Bladder disorder    Colitis 2006   occurred in presnce of SBO , no issues since    Colon polyps    Complication of anesthesia    problems waking up   Diastolic congestive heart failure (HCC)    with pericarditis- no issues last 5 years 09-2015    Diverticulosis    History of depression    History of migraines    Hyperlipemia    Hypothyroidism    Kidney stone    Paroxysmal ventricular tachycardia (HCC)    Pericarditis    Rapid heart rate    maybe once a month since pericarditis   Rosacea    SBO (small bowel obstruction) ([place]) 08/16/2015   Vitamin D deficiency     Patient Active Problem List   Diagnosis Date Noted   S/P repair of ventral hernia  08/14/2017   Incarcerated incisional hernia s/p lap repair w mesh 08/14/2017 08/14/2017   Precordial chest pain 05/04/2017   Infective otitis externa of right ear    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   Malignant otitis externa 11/07/2015   Acute malignant otitis externa of right ear    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   Abdominal pain 08/16/2015   Generalized abdominal pain    Intractable cyclical vomiting with nausea    Pericarditis    Pericarditis, acute 05/03/2010   Obesity 39/76/7341   Diastolic congestive heart failure ([place])    Hyperlipemia    Hypothyroidism    History of depression    History of migraine    Rosacea    Anxiety     Past Surgical History:  Procedure Laterality Date   APPENDECTOMY     CARDIAC CATHETERIZATION  11/15/08   SMOOTH AND NORMAL   CHOLECYSTECTOMY     COLONOSCOPY     LAPAROSCOPIC ASSISTED VENTRAL HERNIA REPAIR N/A 08/14/2017   Procedure: LAPAROSCOPIC ASSISTED [place], CECOPEXY;  Surgeon: Johnathan Hausen, MD;  Location: [facility];  Service: General;  Laterality: N/A;   LAPAROSCOPY     LAPAROSCOPY N/A 10/26/2015   Procedure: diagnostic LAPAROSCOPY LAPAROTOMY AND APPENDECTOMY;  Surgeon: Johnathan Hausen, MD;  Location: [facility];  Service: General;  Laterality: N/A;   LAPAROTOMY  2017   POLYPECTOMY  02/2009   SHOULDER SURGERY     left   TOTAL ABDOMINAL HYSTERECTOMY W/ BILATERAL SALPINGOOPHORECTOMY     UPPER GASTROINTESTINAL ENDOSCOPY       OB History   No obstetric history on file.     Family History  Adopted: Yes  Problem Relation Age of Onset   Breast cancer Neg Hx     Social History   Tobacco Use   Smoking status: Never   Smokeless tobacco: Never  Vaping Use   Vaping Use: Never used  Substance Use Topics   Alcohol use: No   Drug use: No    Home Medications Prior to Admission medications   Medication Sig Start Date End Date Taking? Authorizing Provider  acetaminophen (TYLENOL) 500 MG tablet Take  1,000 mg by mouth daily as needed for moderate pain or headache.    [provider]  ALPRAZolam Duanne Moron) 1 MG tablet Take 1 mg by mouth 3 (three) times daily as needed for anxiety or sleep.     [provider]  cholecalciferol (VITAMIN D) 1000 units tablet Take 4,000 Units by mouth every evening.    [provider]  estrogens-methylTEST (ESTRATEST) 1.25-2.5 MG tablet Take 1 tablet by mouth at bedtime.  03/30/17   [provider]  HYDROcodone-acetaminophen (NORCO/VICODIN) 5-325 MG tablet Take 1 tablet by mouth every 4 (four) hours as needed for moderate pain. 08/18/17   Johnathan Hausen, MD  lamoTRIgine (LAMICTAL) 150 MG tablet Take 150 mg by mouth at bedtime.    [provider]  levothyroxine (SYNTHROID, LEVOTHROID) 75 MCG tablet Take 75 mcg by mouth at bedtime.     [provider]  lurasidone (LATUDA) 40 MG TABS tablet Take 40 mg by mouth daily with supper.    [provider]  metoprolol tartrate (LOPRESSOR) 25 MG tablet Take 25 mg by mouth at bedtime.    [provider]  QUEtiapine (SEROQUEL XR) 200 MG 24 hr tablet Take 200 mg by mouth at bedtime. 04/24/17   [provider]  rizatriptan (MAXALT) 10 MG tablet Take 10 mg by mouth daily as needed for migraine. May repeat in 2 hours if needed     [provider]  rosuvastatin (CRESTOR) 40 MG tablet Take 40 mg by mouth every Monday, Wednesday, and Friday at 8 PM.  04/12/17   [provider]  zolpidem (AMBIEN) 10 MG tablet Take 10 mg by mouth at bedtime as needed for sleep.  06/10/13   [provider]  zonisamide (ZONEGRAN) 100 MG capsule Take 200 mg by mouth at bedtime 11/28/16   [provider]    Allergies    Amoxicillin, Ampicillin, Apple fruit extract, Cherry extract, Fruit & vegetable daily [nutritional supplements], Imitrex [sumatriptan], Other, Peach [prunus persica], Strawberry extract, and Zoloft [sertraline hcl]  Review of Systems    Review of Systems  Constitutional:  Negative for chills and fever.  Gastrointestinal:  Positive for abdominal pain, constipation, diarrhea and nausea. Negative for vomiting.  All other systems reviewed and are negative.  Physical Exam Updated Vital Signs BP 119/62   Pulse 99   Temp 97.8 F (36.6 C) (Oral)   Resp 20   SpO2 95%   Physical Exam Vitals and nursing note reviewed.  Constitutional:      Appearance: She is not ill-appearing or diaphoretic.  HENT:     Head: Normocephalic  and atraumatic.  Eyes:     Conjunctiva/sclera: Conjunctivae normal.  Cardiovascular:     Rate and Rhythm: Normal rate and regular rhythm.     Heart sounds: Normal heart sounds.  Pulmonary:     Effort: Pulmonary effort is normal.     Breath sounds: Normal breath sounds. No wheezing, rhonchi or rales.  Abdominal:     General: Abdomen is flat. Bowel sounds are decreased.     Palpations: Abdomen is soft.     Tenderness: There is abdominal tenderness in the right upper quadrant, right lower quadrant and periumbilical area.  Musculoskeletal:     Cervical back: Neck supple.  Skin:    General: Skin is warm and dry.  Neurological:     Mental Status: She is alert.    ED Results / Procedures / Treatments   Labs (all labs ordered are listed, but only abnormal results are displayed) Labs Reviewed  COMPREHENSIVE METABOLIC PANEL - Abnormal; Notable for the following components:      Result Value   Glucose, Bld 109 (*)    Creatinine, Ser 1.11 (*)    Alkaline Phosphatase 151 (*)    GFR, Estimated 59 (*)    All other components within normal limits  CBC - Abnormal; Notable for the following components:   WBC 10.8 (*)    All other components within normal limits  LIPASE, BLOOD  URINALYSIS, ROUTINE W REFLEX MICROSCOPIC    EKG None  Radiology DG ABD ACUTE 2+V W 1V CHEST  Result Date: 08/09/2020 CLINICAL DATA:  Generalized abdominal pain. Question small bowel obstruction. EXAM: DG ABDOMEN ACUTE WITH  1 VIEW CHEST COMPARISON:  CT 06/12/2017 FINDINGS: Moderate amount of fluid and ingested material in the stomach. Small air-fluid levels in the small and large intestine but no evidence of abnormal dilated bowel. Clips in the right upper quadrant consistent with previous cholecystectomy. No free intraperitoneal air. No significant bone finding. Ordinary phleboliths in the pelvis. One-view chest shows normal heart and mediastinal shadows. The lungs are clear. No free air the diaphragm. IMPRESSION: There are small air-fluid levels in small and large bowel that could go along with diarrhea, but there is no dilated bowel to suggest bowel obstruction. No free air. No chest disease. Electronically Signed   By: Nelson Chimes M.D.   On: 08/09/2020 14:59    Procedures Procedures   Medications Ordered in ED Medications  sodium chloride 0.9 % bolus 1,000 mL (1,000 mLs Intravenous New Bag/Given 08/09/20 1455)  ondansetron (ZOFRAN) injection 4 mg (4 mg Intravenous Given 08/09/20 1455)    ED Course  I have reviewed the triage vital signs and the nursing notes.  Pertinent labs & imaging results that were available during my care of the patient were reviewed by me and considered in my medical decision making (see chart for details).    MDM Rules/Calculators/A&P                          55 year old female who presents to the ED today with complaint of constipation for the past 3 weeks.  Has had some improvement with MiraLAX however now mostly having a small amount of diarrhea with increased abdominal pressure.  Also complaining of abdominal pain, nausea, feculent smelling burps.  On arrival to the ED today patient is afebrile.  She is mildly tachycardic at 105.  Nontachypneic.  Nontoxic-appearing.  On my exam she does have slightly decreased bowel sounds throughout with tenderness palpation to the  right side of the abdomen, past surgical history includes cystectomy and appendectomy.  Patient has had multiple bowel  surgeries in the past related to small bowel obstruction and incarcerated hernia.  She reports this feels similar to then.  She was medically screened in triage and work-up began including CBC, CMP, lipase as well as an acute abdominal series however patient will likely need CT scan.  Will provide fluids, antiemetics, reevaluate.  CBC without leukocytosis. Hgb stable at 14.7 CMP with creatinine 1.11 and GFR 59; receiving fluids at this time. Glucose 109. No other electrolyte abnormalities. Alk phos slightly elevated at 151. Hx of cholecystectomy Lipase 28  Xray: IMPRESSION:  There are small air-fluid levels in small and large bowel that could  go along with diarrhea, but there is no dilated bowel to suggest  bowel obstruction. No free air. No chest disease.   Will plan for CT scan at this time for further visualization. At shift change case signed out to oncoming ED provider Benedetto Goad, PA-C, who will dispo patient accordingly. If findings negative can be discharged home with outpatient follow up with her surgeon.  This note was prepared using Dragon voice recognition software and may include unintentional dictation errors due to the inherent limitations of voice recognition software.   Final Clinical Impression(s) / ED Diagnoses Final diagnoses:  None    Rx / DC Orders ED Discharge Orders     None        Eustaquio Maize, PA-C 08/09/20 1524    Milton Ferguson, MD 08/12/20 [phone]

## 2020-08-09 NOTE — ED Provider Notes (Signed)
Care assumed on PA Cherlyn Cushing at shift change, please see her note for full details, but in brief Cynthia [name] is a 55 y.o. female with history of previous SBO, presenting with constipation for 3 weeks and right-sided abdominal pain.  Reports she has been having some foul-smelling belches as well.  Has only been able to pass some liquid stool after taking MiraLAX, still passing gas.  Lab work overall reassuring, acute abdominal series with some small air-fluid levels in the large bowel but could be related to diarrhea, no obvious obstructive pattern.  CT pending at shift change  Plan: Follow-up CT, if no acute abnormalities can be discharged home with outpatient follow-up  BP 112/62   Pulse (!) 101   Temp 97.8 F (36.6 C) (Oral)   Resp 20   SpO2 96%    ED Course/Procedures   Labs Reviewed  COMPREHENSIVE METABOLIC PANEL - Abnormal; Notable for the following components:      Result Value   Glucose, Bld 109 (*)    Creatinine, Ser 1.11 (*)    Alkaline Phosphatase 151 (*)    GFR, Estimated 59 (*)    All other components within normal limits  CBC - Abnormal; Notable for the following components:   WBC 10.8 (*)    All other components within normal limits  LIPASE, BLOOD  URINALYSIS, ROUTINE W REFLEX MICROSCOPIC   CT Abdomen Pelvis W Contrast  Result Date: 08/09/2020 CLINICAL DATA:  Acute abdominal pain.  Nonlocalized. EXAM: CT ABDOMEN AND PELVIS WITH CONTRAST TECHNIQUE: Multidetector CT imaging of the abdomen and pelvis was performed using the standard protocol following bolus administration of intravenous contrast. CONTRAST:  81mL OMNIPAQUE IOHEXOL 350 MG/ML SOLN COMPARISON:  07/02/2018 FINDINGS: Lower chest: Atelectasis noted posterior right base. Hepatobiliary: No suspicious focal abnormality within the liver parenchyma. Gallbladder surgically absent. No intrahepatic or extrahepatic biliary dilation. Pancreas: No focal mass lesion. No dilatation of the main duct. No intraparenchymal  cyst. No peripancreatic edema. Spleen: No splenomegaly. No focal mass lesion. Adrenals/Urinary Tract: No adrenal nodule or mass. Several tiny stones in the right kidney are nonobstructing and measure up to a bowel 5-6 mm. Punctate nonobstructing stone noted upper pole left kidney on coronal image 103/5. No evidence for hydroureter. Bladder is distended. Stomach/Bowel: Stomach is unremarkable. No gastric wall thickening. No evidence of outlet obstruction. Duodenum is normally positioned as is the ligament of Treitz. No small bowel wall thickening. No small bowel dilatation. The terminal ileum is normal. Nonvisualization of the appendix is consistent with the reported history of appendectomy. No gross colonic mass. No colonic wall thickening. No substantial stool volume. No substantial diverticular disease. Vascular/Lymphatic: No abdominal aortic aneurysm. No abdominal aortic atherosclerotic calcification. There is no gastrohepatic or hepatoduodenal ligament lymphadenopathy. No retroperitoneal or mesenteric lymphadenopathy. No pelvic sidewall lymphadenopathy. Reproductive: The uterus is surgically absent. There is no adnexal mass. Other: No intraperitoneal free fluid. Musculoskeletal: No worrisome lytic or sclerotic osseous abnormality. Stable sequelae of ventral hernia repair. IMPRESSION: 1. No acute findings in the abdomen or pelvis. Specifically, no findings to explain the patient's history of abdominal pain. 2. Bilateral nonobstructing renal stones. Electronically Signed   By: Misty Stanley M.D.   On: 08/09/2020 16:34   DG ABD ACUTE 2+V W 1V CHEST  Result Date: 08/09/2020 CLINICAL DATA:  Generalized abdominal pain. Question small bowel obstruction. EXAM: DG ABDOMEN ACUTE WITH 1 VIEW CHEST COMPARISON:  CT 06/12/2017 FINDINGS: Moderate amount of fluid and ingested material in the stomach. Small air-fluid levels in  the small and large intestine but no evidence of abnormal dilated bowel. Clips in the right upper  quadrant consistent with previous cholecystectomy. No free intraperitoneal air. No significant bone finding. Ordinary phleboliths in the pelvis. One-view chest shows normal heart and mediastinal shadows. The lungs are clear. No free air the diaphragm. IMPRESSION: There are small air-fluid levels in small and large bowel that could go along with diarrhea, but there is no dilated bowel to suggest bowel obstruction. No free air. No chest disease. Electronically Signed   By: Nelson Chimes M.D.   On: 08/09/2020 14:59     Procedures  MDM   CT with no acute findings within the abdomen or pelvis, no significant stool burden noted.  I spoke with patient regarding her diet intake and she reports she typically only eats 2 meals a day, does not eat a large amount of fiber.  No significant changes to diet recently.  She does not have significant stool buildup in the colon.  We will have her begin taking fiber supplement to help bulk up her stool in addition to using MiraLAX daily to help promote regular bowel movements encouraged increased water intake as well.  She already has a follow-up appointment with her doctor scheduled for next week.  Discussed appropriate return precautions.  She expresses understanding and agreement.  Discharged home in good condition.       Jacqlyn Larsen, PA-C 08/09/20 1714    Isla Pence, MD 08/09/20 2042

## 2020-08-09 NOTE — ED Triage Notes (Addendum)
Patient c/o right sided generalized abdominal pain.  Also reports when she burps it smells like feces.  C/o nausea- patient took zofran 2 days ago.  Last BM "over a week ago"  Took miralax with no relief but small amount of diarrhea   4/10 sharp pain.  Hx: bowel blockage with surgery 3 years ago.   A/ox4 Ambulatory in triage  Has had appendix removal and gallbladder removal.

## 2020-08-22 NOTE — Progress Notes (Signed)
Sent message, via epic in basket, requesting orders in epic from surgeon.  

## 2020-08-28 NOTE — Progress Notes (Addendum)
COVID Vaccine Completed: x3 Date COVID Vaccine completed: Has received booster: COVID vaccine manufacturer: Pfizer     Date of COVID positive in last 90 days: No  PCP - Claris Gower, MD Cardiologist - N/a  Chest x-ray -08/09/20 Epic EKG - trying to get Stress Test - N/a ECHO - 05/05/17 Epic Cardiac Cath - 2010 per pt Pacemaker/ICD device last checked: N/a Spinal Cord Stimulator: N/a  Sleep Study - N/a CPAP -   Fasting Blood Sugar -  pre DM no sugar check Checks Blood Sugar _____ times a day  Blood Thinner Instructions: N/a Aspirin Instructions: Last Dose:  Activity level: Can go up a flight of stairs and perform activities of daily living without stopping and without symptoms of chest pain or shortness of breath.    Anesthesia review: perixcarditis, CHF, PAT glucose 44.  Patient denies shortness of breath, fever, cough and chest pain at PAT appointment   Patient verbalized understanding of instructions that were given to them at the PAT appointment. Patient was also instructed that they will need to review over the PAT instructions again at home before surgery.

## 2020-08-28 NOTE — Progress Notes (Signed)
Please place orders for PAT appointment scheduled 08/30/20.

## 2020-08-30 ENCOUNTER — Encounter (HOSPITAL_COMMUNITY): Payer: Self-pay

## 2020-08-30 ENCOUNTER — Other Ambulatory Visit: Payer: Self-pay

## 2020-08-30 ENCOUNTER — Encounter (HOSPITAL_COMMUNITY)
Admission: RE | Admit: 2020-08-30 | Discharge: 2020-08-30 | Disposition: A | Discharge status: Home / Self Care | Payer: 59 | Source: Ambulatory Visit | Attending: Specialist | Admitting: Specialist

## 2020-08-30 DIAGNOSIS — E039 Hypothyroidism, unspecified: Secondary | ICD-10-CM | POA: Insufficient documentation

## 2020-08-30 DIAGNOSIS — M1711 Unilateral primary osteoarthritis, right knee: Secondary | ICD-10-CM | POA: Insufficient documentation

## 2020-08-30 DIAGNOSIS — Z79899 Other long term (current) drug therapy: Secondary | ICD-10-CM | POA: Diagnosis not present

## 2020-08-30 DIAGNOSIS — Z01812 Encounter for preprocedural laboratory examination: Secondary | ICD-10-CM | POA: Diagnosis not present

## 2020-08-30 DIAGNOSIS — Z79890 Hormone replacement therapy (postmenopausal): Secondary | ICD-10-CM | POA: Diagnosis not present

## 2020-08-30 HISTORY — DX: Pneumonia, unspecified organism: J18.9

## 2020-08-30 HISTORY — DX: Depression, unspecified: F32.A

## 2020-08-30 HISTORY — DX: Prediabetes: R73.03

## 2020-08-30 HISTORY — DX: Personal history of urinary calculi: Z87.442

## 2020-08-30 HISTORY — DX: Other specified postprocedural states: Z98.890

## 2020-08-30 HISTORY — DX: Other specified postprocedural states: R11.2

## 2020-08-30 LAB — BASIC METABOLIC PANEL
Anion gap: 10 (ref 5–15)
BUN: 8 mg/dL (ref 6–20)
CO2: 25 mmol/L (ref 22–32)
Calcium: 9.7 mg/dL (ref 8.9–10.3)
Chloride: 99 mmol/L (ref 98–111)
Creatinine, Ser: 0.93 mg/dL (ref 0.44–1.00)
GFR, Estimated: >60 mL/min (ref >60)
Glucose, Bld: 44 mg/dL — CL (ref 70–99)
Potassium: 4.2 mmol/L (ref 3.5–5.1)
Sodium: 134 mmol/L — ABNORMAL LOW (ref 135–145)

## 2020-08-30 LAB — SURGICAL PCR SCREEN
MRSA, PCR: NEGATIVE
Staphylococcus aureus: NEGATIVE

## 2020-08-30 LAB — CBC
HCT: 43.5 % (ref 36.0–46.0)
Hemoglobin: 14 g/dL (ref 12.0–15.0)
MCH: 30.3 pg (ref 26.0–34.0)
MCHC: 32.2 g/dL (ref 30.0–36.0)
MCV: 94.2 fL (ref 80.0–100.0)
Platelets: 277 10*3/uL (ref 150–400)
RBC: 4.62 MIL/uL (ref 3.87–5.11)
RDW: 13.4 % (ref 11.5–15.5)
WBC: 10.4 10*3/uL (ref 4.0–10.5)
nRBC: 0 % (ref 0.0–0.2)

## 2020-08-30 NOTE — Progress Notes (Signed)
CRITICAL RESULT PROVIDER NOTIFICATION  Test performed and critical result:  Glucose 44 on BMP of 08/30/20  Date and time result received: 08/30/20 1650  Provider name/title: Gillian Shields RN   Date and time provider notified: 08/30/20 Notified Roanna Banning at 1706  Date and time provider responded: 08/30/20 1706pm   Provider response:   Spoke with Roanna Banning via  phone and made aware glucose was 44 on BMp done 08/30/20.  When Hudson from lab called and LVMM on [phone] regarding critical lab value.  Called lab and spoke with Ovid Curd and Ovid Curd gave me critical lab result.  Immediately called patient at home and Cynthia [name] answered phone and verified by DOB pt stated she had eaten and drank since preop visit and that she was going to eat again.  Roanna Banning made aware that nurse had spoken with pt and pt verified she had eat and drank and was doing fine.  No further orders given by Roanna Banning.

## 2020-08-30 NOTE — Patient Instructions (Addendum)
DUE TO COVID-19 ONLY ONE VISITOR IS ALLOWED TO COME WITH YOU AND STAY IN THE WAITING ROOM ONLY DURING PRE OP AND PROCEDURE.   **NO VISITORS ARE ALLOWED IN THE SHORT STAY AREA OR RECOVERY ROOM!!**       Your procedure is scheduled on: 09/12/20   Report to [facility] Main  Entrance    Report to admitting at 6:00 AM   Call this number if you have problems the morning of surgery [phone]   Do not eat food :After Midnight.   May have liquids until  5:30 AM  day of surgery  CLEAR LIQUID DIET  Foods Allowed                                                                     Foods Excluded  Water, Black Coffee and tea, regular and decaf               liquids that you cannot  Plain Jell-O in any flavor  (No red)                                    see through such as: Fruit ices (not with fruit pulp)                                            milk, soups, orange juice              Iced Popsicles (No red)                                               All solid food                                   Apple juices Sports drinks like Gatorade (No red) Lightly seasoned clear broth or consume(fat free)     Oral Hygiene is also important to reduce your risk of infection.                                    Remember - BRUSH YOUR TEETH THE MORNING OF SURGERY WITH YOUR REGULAR TOOTHPASTE   Take these medicines the morning of surgery with A SIP OF WATER: Acetaminophen, Alprazolam, Tamsulosin.                               You may not have any metal on your body including hair pins, jewelry, and body piercing             Do not wear make-up, lotions, powders, perfumes, or deodorant  Do not wear nail polish including gel and S&S, artificial/acrylic nails, or any other type of covering on natural nails including finger and toenails. If you have artificial  nails, gel coating, etc. that needs to be removed by a nail salon please have this removed prior to surgery or surgery may need to be  canceled/ delayed if the surgeon/ anesthesia feels like they are unable to be safely monitored.   Do not shave  48 hours prior to surgery.    Do not bring valuables to the hospital. [place].    Patients discharged the day of surgery will not be allowed to drive home.  Special Instructions: Bring a copy of your healthcare power of attorney and living will documents         the day of surgery if you haven't scanned them in before.   Please read over the following fact sheets you were given: IF YOU HAVE QUESTIONS ABOUT YOUR PRE OP INSTRUCTIONS PLEASE CALL [phone]- [phone] - Preparing for Surgery Before surgery, you can play an important role.  Because skin is not sterile, your skin needs to be as free of germs as possible.  You can reduce the number of germs on your skin by washing with CHG (chlorahexidine gluconate) soap before surgery.  CHG is an antiseptic cleaner which kills germs and bonds with the skin to continue killing germs even after washing. Please DO NOT use if you have an allergy to CHG or antibacterial soaps.  If your skin becomes reddened/irritated stop using the CHG and inform your nurse when you arrive at Short Stay. Do not shave (including legs and underarms) for at least 48 hours prior to the first CHG shower.  You may shave your face/neck.  Please follow these instructions carefully:  1.  Shower with CHG Soap the night before surgery and the  morning of surgery.  2.  If you choose to wash your hair, wash your hair first as usual with your normal  shampoo.  3.  After you shampoo, rinse your hair and body thoroughly to remove the shampoo.                             4.  Use CHG as you would any other liquid soap.  You can apply chg directly to the skin and wash.  Gently with a scrungie or clean washcloth.  5.  Apply the CHG Soap to your body ONLY FROM THE NECK DOWN.   Do   not use on face/ open                            Wound or open sores. Avoid contact with eyes, ears mouth and   genitals (private parts).                       Wash face,  Genitals (private parts) with your normal soap.             6.  Wash thoroughly, paying special attention to the area where your    surgery  will be performed.  7.  Thoroughly rinse your body with warm water from the neck down.  8.  DO NOT shower/wash with your normal soap after using and rinsing off the CHG Soap.                9.  Pat yourself dry with a clean towel.  10.  Wear clean pajamas.            11.  Place clean sheets on your bed the night of your first shower and do not  sleep with pets. Day of Surgery : Do not apply any lotions/deodorants the morning of surgery.  Please wear clean clothes to the hospital/surgery center.  FAILURE TO FOLLOW THESE INSTRUCTIONS MAY RESULT IN THE CANCELLATION OF YOUR SURGERY  PATIENT SIGNATURE_________________________________  NURSE SIGNATURE__________________________________  ________________________________________________________________________

## 2020-08-31 ENCOUNTER — Encounter (HOSPITAL_COMMUNITY): Payer: Self-pay | Admitting: Physician Assistant

## 2020-08-31 NOTE — Progress Notes (Signed)
Anesthesia Chart Review   Case: [id number] Date/Time: 09/12/20 0815   Procedure: PATELLA-FEMORAL ARTHROPLASTY (Right: Knee) - adductor canal block   Anesthesia type: Spinal   Pre-op diagnosis: right patellofemoral osteoarthritis   Location: [place] 08 / [facility]   Surgeons: Sydnee Cabal, MD       DISCUSSION:.never smoker with h/o PONV, hypothyroidism, right patellofemoral OA scheduled for above procedure 09/12/2020 with Dr. Sydnee Cabal.   Blood Glucose 44 at PAT visit 08/30/2020, labs resulted after pt had returned home.  Pt reports she is asymptomatic, no problems with hypoglycemia in the past.  Has eaten since her visit.  Advised to reach out to PCP if she develops sx.  Pt is a PA, understands and agrees with plan.  VS: BP 132/78   Pulse 100   Temp 36.5 C (Oral)   Resp 18   Ht '5\' 7"'$  (1.702 m)   Wt 103.9 kg   SpO2 99%   BMI 35.87 kg/m   PROVIDERS: Leonard Downing, MD is PCP    LABS: Labs reviewed: Acceptable for surgery. (all labs ordered are listed, but only abnormal results are displayed)  Labs Reviewed  BASIC METABOLIC PANEL - Abnormal; Notable for the following components:      Result Value   Sodium 134 (*)    Glucose, Bld 44 (*)    All other components within normal limits  SURGICAL PCR SCREEN  CBC     IMAGES: 05/05/2017 CT CORONARY MORPH W/CTA COR W/SCORE W/CA W/CM &/OR WO/CM IMPRESSION: 1. Coronary calcium score of 0. This was 0 percentile for age and sex matched control.   2. Normal coronary origin with right dominance.   3. No evidence of CAD.  EKG:   CV: Echo 05/05/2017 Study Conclusions   - Left ventricle: The cavity size was normal. Systolic function was    normal. The estimated ejection fraction was in the range of 55%    to 60%. Wall motion was normal; there were no regional wall    motion abnormalities. Left ventricular diastolic function    parameters were normal.  - Atrial septum: No defect or patent foramen ovale was identified.   Past Medical History:  Diagnosis Date   Allergy    Anxiety    Bladder disorder    Colitis 2006   occurred in presnce of SBO , no issues since    Colon polyps    Complication of anesthesia    problems waking up   Depression    Diastolic congestive heart failure (HCC)    with pericarditis- no issues last 5 years 09-2015    Diverticulosis    History of depression    History of kidney stones    History of migraines    Hyperlipemia    Hypothyroidism    Kidney stone    Paroxysmal ventricular tachycardia (HCC)    Pericarditis    Pneumonia    PONV (postoperative nausea and vomiting)    Pre-diabetes    Rapid heart rate    maybe once a month since pericarditis   Rosacea    SBO (small bowel obstruction) ([place]) 08/16/2015   Vitamin D deficiency     Past Surgical History:  Procedure Laterality Date   ACHILLES TENDON SURGERY Left    APPENDECTOMY     CARDIAC CATHETERIZATION  11/15/2008   SMOOTH AND NORMAL   CHOLECYSTECTOMY     COLONOSCOPY     LAPAROSCOPIC ASSISTED VENTRAL HERNIA REPAIR N/A 08/14/2017   Procedure: LAPAROSCOPIC ASSISTED VENTRAL HERNIA  REPAIR ERAS PATHWAY, CECOPEXY;  Surgeon: Johnathan Hausen, MD;  Location: [facility];  Service: General;  Laterality: N/A;   LAPAROSCOPY     LAPAROSCOPY N/A 10/26/2015   Procedure: diagnostic LAPAROSCOPY LAPAROTOMY AND APPENDECTOMY;  Surgeon: Johnathan Hausen, MD;  Location: [facility];  Service: General;  Laterality: N/A;   LAPAROTOMY  2017   POLYPECTOMY  02/2009   SHOULDER SURGERY     left   TOTAL ABDOMINAL HYSTERECTOMY W/ BILATERAL SALPINGOOPHORECTOMY     UPPER GASTROINTESTINAL ENDOSCOPY      MEDICATIONS:  acetaminophen (TYLENOL) 500 MG tablet   AIMOVIG 70 MG/ML SOAJ   ALPRAZolam (XANAX) 1 MG tablet   BELSOMRA 20 MG TABS   cholecalciferol (VITAMIN D) 1000 units tablet   estrogens-methylTEST (ESTRATEST) 1.25-2.5 MG tablet   lamoTRIgine (LAMICTAL) 200 MG tablet   Levothyroxine Sodium 88 MCG CAPS   LINZESS 145 MCG CAPS capsule    metoprolol tartrate (LOPRESSOR) 25 MG tablet   OZEMPIC, 0.25 OR 0.5 MG/DOSE, 2 MG/1.5ML SOPN   rosuvastatin (CRESTOR) 40 MG tablet   SAPHRIS 10 MG SUBL   tamsulosin (FLOMAX) 0.4 MG CAPS capsule   UBRELVY 50 MG TABS   zonisamide (ZONEGRAN) 100 MG capsule   No current facility-administered medications for this encounter.     Konrad Felix, PA-C [facility] [phone]

## 2020-08-31 NOTE — Progress Notes (Signed)
Glucose results 44. Results sent to Dr. Theda Sers.

## 2020-09-12 ENCOUNTER — Encounter (HOSPITAL_COMMUNITY): Admission: RE | Payer: Self-pay | Source: Home / Self Care

## 2020-09-12 ENCOUNTER — Ambulatory Visit (HOSPITAL_COMMUNITY): Admission: RE | Admit: 2020-09-12 | Payer: 59 | Source: Home / Self Care | Admitting: Specialist

## 2020-09-12 SURGERY — ARTHROPLASTY, PATELLOFEMORAL
Anesthesia: Spinal | Site: Knee | Laterality: Right

## 2020-09-24 NOTE — Patient Instructions (Signed)
DUE TO COVID-19 ONLY ONE VISITOR IS ALLOWED TO COME WITH YOU AND STAY IN THE WAITING ROOM ONLY DURING PRE OP AND PROCEDURE DAY OF SURGERY. THE 1 VISITOR  MAY VISIT WITH YOU AFTER SURGERY IN YOUR PRIVATE ROOM DURING VISITING HOURS ONLY!                 WINDA MARCUSSEN     Your procedure is scheduled on: 10/03/20   Report to [place]  Entrance   Report to short stay at 5:15 AM     Call this number if you have problems the morning of surgery [place], NO [place].   No food after midnight.    You may have clear liquid until 4:30 AM.    At 4:00 AM drink pre surgery drink.   Nothing by mouth after 4:30 AM.    Take these medicines the morning of surgery with A SIP OF WATER:   DO NOT TAKE ANY DIABETIC MEDICATIONS DAY OF YOUR SURGERY                               You may not have any metal on your body including hair pins and              piercings  Do not wear jewelry, make-up, lotions, powders or perfumes, deodorant             Do not wear nail polish on your fingernails.  Do not shave  48 hours prior to surgery.                Do not bring valuables to the hospital. [place].  Contacts, dentures or bridgework may not be worn into surgery.       Patients discharged the day of surgery will not be allowed to drive home.  IF YOU ARE HAVING SURGERY AND GOING HOME THE SAME DAY, YOU MUST HAVE AN ADULT TO DRIVE YOU HOME AND BE WITH YOU FOR 24 HOURS.  YOU MAY GO HOME BY TAXI OR UBER OR ORTHERWISE, BUT AN ADULT MUST ACCOMPANY YOU HOME AND STAY WITH YOU FOR 24 HOURS.  Name and phone number of your driver:  Special Instructions: N/A              Please read over the following fact sheets you were given: _____________________________________________________________________             [facility] - Preparing for Surgery Before surgery,  you can play an important role.  Because skin is not sterile, your skin needs to be as free of germs as possible.  You can reduce the number of germs on your skin by washing with CHG (chlorahexidine gluconate) soap before surgery.  CHG is an antiseptic cleaner which kills germs and bonds with the skin to continue killing germs even after washing. Please DO NOT use if you have an allergy to CHG or antibacterial soaps.  If your skin becomes reddened/irritated stop using the CHG and inform your nurse when you arrive at Short Stay. Do not shave (including legs and underarms) for at least 48 hours prior to the first CHG shower.  Please follow these instructions carefully:  1.  Shower with CHG  Soap the night before surgery and the  morning of Surgery.  2.  If you choose to wash your hair, wash your hair first as usual with your  normal  shampoo.  3.  After you shampoo, rinse your hair and body thoroughly to remove the  shampoo.                            4.  Use CHG as you would any other liquid soap.  You can apply chg directly  to the skin and wash                       Gently with a scrungie or clean washcloth.  5.  Apply the CHG Soap to your body ONLY FROM THE NECK DOWN.   Do not use on face/ open                           Wound or open sores. Avoid contact with eyes, ears mouth and genitals (private parts).                       Wash face,  Genitals (private parts) with your normal soap.             6.  Wash thoroughly, paying special attention to the area where your surgery  will be performed.  7.  Thoroughly rinse your body with warm water from the neck down.  8.  DO NOT shower/wash with your normal soap after using and rinsing off  the CHG Soap.             9.  Pat yourself dry with a clean towel.            10.  Wear clean pajamas.            11.  Place clean sheets on your bed the night of your first shower and do not  sleep with pets. Day of Surgery : Do not apply any lotions/deodorants the  morning of surgery.  Please wear clean clothes to the hospital/surgery center.  FAILURE TO FOLLOW THESE INSTRUCTIONS MAY RESULT IN THE CANCELLATION OF YOUR SURGERY PATIENT SIGNATURE_________________________________  NURSE SIGNATURE__________________________________  ________________________________________________________________________   Adam Phenix  An incentive spirometer is a tool that can help keep your lungs clear and active. This tool measures how well you are filling your lungs with each breath. Taking long deep breaths may help reverse or decrease the chance of developing breathing (pulmonary) problems (especially infection) following: A long period of time when you are unable to move or be active. BEFORE THE PROCEDURE  If the spirometer includes an indicator to show your best effort, your nurse or respiratory therapist will set it to a desired goal. If possible, sit up straight or lean slightly forward. Try not to slouch. Hold the incentive spirometer in an upright position. INSTRUCTIONS FOR USE  Sit on the edge of your bed if possible, or sit up as far as you can in bed or on a chair. Hold the incentive spirometer in an upright position. Breathe out normally. Place the mouthpiece in your mouth and seal your lips tightly around it. Breathe in slowly and as deeply as possible, raising the piston or the ball toward the top of the column. Hold your breath for 3-5 seconds or for as long as possible. Allow the piston or  ball to fall to the bottom of the column. Remove the mouthpiece from your mouth and breathe out normally. Rest for a few seconds and repeat Steps 1 through 7 at least 10 times every 1-2 hours when you are awake. Take your time and take a few normal breaths between deep breaths. The spirometer may include an indicator to show your best effort. Use the indicator as a goal to work toward during each repetition. After each set of 10 deep breaths, practice  coughing to be sure your lungs are clear. If you have an incision (the cut made at the time of surgery), support your incision when coughing by placing a pillow or rolled up towels firmly against it. Once you are able to get out of bed, walk around indoors and cough well. You may stop using the incentive spirometer when instructed by your caregiver.  RISKS AND COMPLICATIONS Take your time so you do not get dizzy or light-headed. If you are in pain, you may need to take or ask for pain medication before doing incentive spirometry. It is harder to take a deep breath if you are having pain. AFTER USE Rest and breathe slowly and easily. It can be helpful to keep track of a log of your progress. Your caregiver can provide you with a simple table to help with this. If you are using the spirometer at home, follow these instructions: [place] IF:  You are having difficultly using the spirometer. You have trouble using the spirometer as often as instructed. Your pain medication is not giving enough relief while using the spirometer. You develop fever of 100.5 F (38.1 C) or higher. SEEK IMMEDIATE MEDICAL CARE IF:  You cough up bloody sputum that had not been present before. You develop fever of 102 F (38.9 C) or greater. You develop worsening pain at or near the incision site. MAKE SURE YOU:  Understand these instructions. Will watch your condition. Will get help right away if you are not doing well or get worse. Document Released: 06/02/2006 Document Revised: 04/14/2011 Document Reviewed: 08/03/2006 [facility] Patient Information 2014 [place], [place].   ________________________________________________________________________

## 2020-09-25 ENCOUNTER — Encounter (HOSPITAL_COMMUNITY)
Admission: RE | Admit: 2020-09-25 | Discharge: 2020-09-25 | Disposition: A | Discharge status: Home / Self Care | Payer: 59 | Source: Ambulatory Visit | Attending: Specialist | Admitting: Specialist

## 2020-09-25 ENCOUNTER — Other Ambulatory Visit: Payer: Self-pay

## 2020-09-25 ENCOUNTER — Encounter (HOSPITAL_COMMUNITY): Payer: Self-pay

## 2020-09-25 DIAGNOSIS — Z01812 Encounter for preprocedural laboratory examination: Secondary | ICD-10-CM | POA: Insufficient documentation

## 2020-09-25 HISTORY — DX: Headache, unspecified: R51.9

## 2020-09-25 LAB — GLUCOSE, CAPILLARY: Glucose-Capillary: 84 mg/dL (ref 70–99)

## 2020-09-25 LAB — SURGICAL PCR SCREEN
MRSA, PCR: NEGATIVE
Staphylococcus aureus: NEGATIVE

## 2020-09-25 LAB — CBC
HCT: 44.8 % (ref 36.0–46.0)
Hemoglobin: 14 g/dL (ref 12.0–15.0)
MCH: 29.2 pg (ref 26.0–34.0)
MCHC: 31.3 g/dL (ref 30.0–36.0)
MCV: 93.5 fL (ref 80.0–100.0)
Platelets: 283 10*3/uL (ref 150–400)
RBC: 4.79 MIL/uL (ref 3.87–5.11)
RDW: 13.7 % (ref 11.5–15.5)
WBC: 9.1 10*3/uL (ref 4.0–10.5)
nRBC: 0 % (ref 0.0–0.2)

## 2020-09-25 LAB — COMPREHENSIVE METABOLIC PANEL
ALT: 20 U/L (ref 0–44)
AST: 32 U/L (ref 15–41)
Albumin: 4.4 g/dL (ref 3.5–5.0)
Alkaline Phosphatase: 125 U/L (ref 38–126)
Anion gap: 7 (ref 5–15)
BUN: 8 mg/dL (ref 6–20)
CO2: 27 mmol/L (ref 22–32)
Calcium: 9.6 mg/dL (ref 8.9–10.3)
Chloride: 103 mmol/L (ref 98–111)
Creatinine, Ser: 0.88 mg/dL (ref 0.44–1.00)
GFR, Estimated: >60 mL/min (ref >60)
Glucose, Bld: 69 mg/dL — ABNORMAL LOW (ref 70–99)
Potassium: 4.2 mmol/L (ref 3.5–5.1)
Sodium: 137 mmol/L (ref 135–145)
Total Bilirubin: 0.1 mg/dL — ABNORMAL LOW (ref 0.3–1.2)
Total Protein: 8.1 g/dL (ref 6.5–8.1)

## 2020-09-25 NOTE — Progress Notes (Addendum)
COVID test N/A   PCP - Dr. Charna Elizabeth notes requested Cardiologist - Dr. Joaquim Nam  Chest x-ray - no EKG - 02/2020 requested Stress Test - no ECHO - 05/05/17-epic and 2015 Cardiac Cath - 11/15/08-epic Pacemaker/ICD device last checked:NA  Sleep Study - no CPAP -   Fasting Blood Sugar - doesn't test takes Ozempic for wt loss Checks Blood Sugar _____ times a day  Blood Thinner Instructions:NA Aspirin Instructions: Last Dose:  Anesthesia review: yes  Patient denies shortness of breath, fever, cough and chest pain at PAT appointment. Yes  Pt reports no SOB with any activities. She had pericarditis in 2015.   Patient verbalized understanding of instructions that were given to them at the PAT appointment. Patient was also instructed that they will need to review over the PAT instructions again at home before surgery. yes

## 2020-09-25 NOTE — H&P (View-Only) (Signed)
COVID test N/A   PCP - Dr. Charna Elizabeth notes requested Cardiologist - Dr. Joaquim Nam  Chest x-ray - no EKG - 02/2020 requested Stress Test - no ECHO - 05/05/17-epic and 2015 Cardiac Cath - 11/15/08-epic Pacemaker/ICD device last checked:NA  Sleep Study - no CPAP -   Fasting Blood Sugar - doesn't test takes Ozempic for wt loss Checks Blood Sugar _____ times a day  Blood Thinner Instructions:NA Aspirin Instructions: Last Dose:  Anesthesia review: yes  Patient denies shortness of breath, fever, cough and chest pain at PAT appointment. Yes  Pt reports no SOB with any activities. She had pericarditis in 2015.   Patient verbalized understanding of instructions that were given to them at the PAT appointment. Patient was also instructed that they will need to review over the PAT instructions again at home before surgery. yes

## 2020-10-02 MED ORDER — BUPIVACAINE LIPOSOME 1.3 % IJ SUSP
20.0000 mL | INTRAMUSCULAR | Status: DC
  Filled 2020-10-02: qty 20

## 2020-10-02 NOTE — Anesthesia Preprocedure Evaluation (Addendum)
Anesthesia Evaluation  Patient identified by MRN, date of birth, ID band Patient awake    Reviewed: Allergy & Precautions, NPO status , Patient's Chart, lab work & pertinent test results  History of Anesthesia Complications (+) PONV and history of anesthetic complications  Airway Mallampati: II  TM Distance: >3 FB Neck ROM: Full    Dental no notable dental hx. (+) Dental Advisory Given   Pulmonary neg pulmonary ROS,    Pulmonary exam normal breath sounds clear to auscultation       Cardiovascular Pt. on home beta blockers +CHF  Normal cardiovascular exam Rhythm:Regular Rate:Normal  ECHO 07-04-13:  Study Conclusions  - Left ventricle: The cavity size was normal. Systolic function was normal. The estimated ejection fraction was in the range of 55% to 60%. Wall motion was normal; there were no regional wall motion abnormalities. - Left atrium: The atrium was mildly dilated. - Atrial septum: No defect or patent foramen ovale was identified.   Neuro/Psych  Headaches, PSYCHIATRIC DISORDERS Anxiety Depression    GI/Hepatic Neg liver ROS, GERD  ,  Endo/Other  Hypothyroidism   Renal/GU Renal disease     Musculoskeletal negative musculoskeletal ROS (+)   Abdominal (+) + obese,   Peds  Hematology negative hematology ROS (+)   Anesthesia Other Findings   Reproductive/Obstetrics negative OB ROS                            Anesthesia Physical  Anesthesia Plan  ASA: 2  Anesthesia Plan: General   Post-op Pain Management: GA combined w/ Regional for post-op pain   Induction: Intravenous  PONV Risk Score and Plan: 3 and Ondansetron, Dexamethasone, Midazolam, Propofol infusion and Treatment may vary due to age or medical condition  Airway Management Planned: LMA  Additional Equipment: None  Intra-op Plan:   Post-operative Plan:   Informed Consent: I have reviewed the patients  History and Physical, chart, labs and discussed the procedure including the risks, benefits and alternatives for the proposed anesthesia with the patient or authorized representative who has indicated his/her understanding and acceptance.     Dental advisory given  Plan Discussed with: CRNA  Anesthesia Plan Comments:        Anesthesia Quick Evaluation

## 2020-10-03 ENCOUNTER — Ambulatory Visit (HOSPITAL_COMMUNITY): Payer: 59 | Admitting: Certified Registered"

## 2020-10-03 ENCOUNTER — Encounter (HOSPITAL_COMMUNITY)
Admission: RE | Disposition: A | Discharge status: Home / Self Care | Payer: Self-pay | Source: Ambulatory Visit | Attending: Specialist

## 2020-10-03 ENCOUNTER — Encounter (HOSPITAL_COMMUNITY): Payer: Self-pay | Admitting: Specialist

## 2020-10-03 ENCOUNTER — Other Ambulatory Visit: Payer: Self-pay

## 2020-10-03 ENCOUNTER — Ambulatory Visit (HOSPITAL_COMMUNITY): Payer: 59 | Admitting: Physician Assistant

## 2020-10-03 ENCOUNTER — Observation Stay (HOSPITAL_COMMUNITY)
Admission: RE | Admit: 2020-10-03 | Discharge: 2020-10-05 | Disposition: A | Discharge status: Home / Self Care | Payer: 59 | Source: Ambulatory Visit | Attending: Specialist | Admitting: Specialist

## 2020-10-03 DIAGNOSIS — E039 Hypothyroidism, unspecified: Secondary | ICD-10-CM | POA: Diagnosis not present

## 2020-10-03 DIAGNOSIS — Z79899 Other long term (current) drug therapy: Secondary | ICD-10-CM | POA: Diagnosis not present

## 2020-10-03 DIAGNOSIS — M1711 Unilateral primary osteoarthritis, right knee: Principal | ICD-10-CM | POA: Insufficient documentation

## 2020-10-03 DIAGNOSIS — I5030 Unspecified diastolic (congestive) heart failure: Secondary | ICD-10-CM | POA: Diagnosis not present

## 2020-10-03 HISTORY — PX: PATELLA-FEMORAL ARTHROPLASTY: SHX5037

## 2020-10-03 LAB — COMPREHENSIVE METABOLIC PANEL
ALT: 12 U/L (ref 0–44)
AST: 28 U/L (ref 15–41)
Albumin: 3.8 g/dL (ref 3.5–5.0)
Alkaline Phosphatase: 102 U/L (ref 38–126)
Anion gap: 7 (ref 5–15)
BUN: 13 mg/dL (ref 6–20)
CO2: 25 mmol/L (ref 22–32)
Calcium: 8.9 mg/dL (ref 8.9–10.3)
Chloride: 107 mmol/L (ref 98–111)
Creatinine, Ser: 1.01 mg/dL — ABNORMAL HIGH (ref 0.44–1.00)
GFR, Estimated: >60 mL/min (ref >60)
Glucose, Bld: 112 mg/dL — ABNORMAL HIGH (ref 70–99)
Potassium: 4.4 mmol/L (ref 3.5–5.1)
Sodium: 139 mmol/L (ref 135–145)
Total Bilirubin: 0.4 mg/dL (ref 0.3–1.2)
Total Protein: 7.4 g/dL (ref 6.5–8.1)

## 2020-10-03 LAB — TYPE AND SCREEN
ABO/RH(D): A POS
Antibody Screen: NEGATIVE

## 2020-10-03 LAB — CBC
HCT: 40.2 % (ref 36.0–46.0)
HCT: 26.2 % — ABNORMAL LOW (ref 36.0–46.0)
Hemoglobin: 12.9 g/dL (ref 12.0–15.0)
Hemoglobin: 8.9 g/dL — ABNORMAL LOW (ref 12.0–15.0)
MCH: 30.1 pg (ref 26.0–34.0)
MCH: 29.4 pg (ref 26.0–34.0)
MCHC: 32.1 g/dL (ref 30.0–36.0)
MCHC: 34 g/dL (ref 30.0–36.0)
MCV: 93.7 fL (ref 80.0–100.0)
MCV: 86.5 fL (ref 80.0–100.0)
Platelets: 237 10*3/uL (ref 150–400)
Platelets: 228 10*3/uL (ref 150–400)
RBC: 4.29 MIL/uL (ref 3.87–5.11)
RBC: 3.03 MIL/uL — ABNORMAL LOW (ref 3.87–5.11)
RDW: 14.2 % (ref 11.5–15.5)
RDW: 13.5 % (ref 11.5–15.5)
WBC: 9.6 10*3/uL (ref 4.0–10.5)
WBC: 12.6 10*3/uL — ABNORMAL HIGH (ref 4.0–10.5)
nRBC: 0 % (ref 0.0–0.2)
nRBC: 0 % (ref 0.0–0.2)

## 2020-10-03 LAB — CREATININE, SERUM
Creatinine, Ser: 0.76 mg/dL (ref 0.44–1.00)
GFR, Estimated: >60 mL/min (ref >60)

## 2020-10-03 LAB — ABO/RH: ABO/RH(D): A POS

## 2020-10-03 SURGERY — ARTHROPLASTY, PATELLOFEMORAL
Anesthesia: General | Site: Knee | Laterality: Right

## 2020-10-03 MED ORDER — ONDANSETRON HCL 4 MG/2ML IJ SOLN: 4.0000 mg | Freq: Four times a day (QID) | INTRAMUSCULAR | Status: DC | PRN

## 2020-10-03 MED ORDER — CEPHALEXIN 500 MG PO CAPS: 500.0000 mg | ORAL_CAPSULE | Freq: Four times a day (QID) | ORAL | 0 refills | Status: AC

## 2020-10-03 MED ORDER — CHLORHEXIDINE GLUCONATE 0.12 % MT SOLN
15.0000 mL | Freq: Once | OROMUCOSAL | Status: AC
  Administered 2020-10-03: 15 mL via OROMUCOSAL

## 2020-10-03 MED ORDER — MEPERIDINE HCL 50 MG/ML IJ SOLN: 6.2500 mg | INTRAMUSCULAR | Status: DC | PRN

## 2020-10-03 MED ORDER — ONDANSETRON HCL 4 MG PO TABS: 4.0000 mg | ORAL_TABLET | Freq: Four times a day (QID) | ORAL | Status: DC | PRN

## 2020-10-03 MED ORDER — TAMSULOSIN HCL 0.4 MG PO CAPS
0.4000 mg | ORAL_CAPSULE | Freq: Every day | ORAL | Status: DC
  Administered 2020-10-03 – 2020-10-04 (×2): 0.4 mg via ORAL
  Filled 2020-10-03 (×3): qty 1

## 2020-10-03 MED ORDER — ACETAMINOPHEN 500 MG PO TABS
500.0000 mg | ORAL_TABLET | Freq: Four times a day (QID) | ORAL | Status: AC
  Administered 2020-10-03 – 2020-10-04 (×4): 500 mg via ORAL
  Filled 2020-10-03 (×4): qty 1

## 2020-10-03 MED ORDER — OXYCODONE HCL 5 MG PO TABS: 5.0000 mg | ORAL_TABLET | ORAL | 0 refills | Status: AC | PRN

## 2020-10-03 MED ORDER — AMITRIPTYLINE HCL 50 MG PO TABS
50.0000 mg | ORAL_TABLET | Freq: Every day | ORAL | Status: DC
  Administered 2020-10-03 – 2020-10-04 (×2): 50 mg via ORAL
  Filled 2020-10-03 (×2): qty 1

## 2020-10-03 MED ORDER — CEFAZOLIN SODIUM-DEXTROSE 2-4 GM/100ML-% IV SOLN
2.0000 g | INTRAVENOUS | Status: DC
  Filled 2020-10-03: qty 100

## 2020-10-03 MED ORDER — ORAL CARE MOUTH RINSE: 15.0000 mL | Freq: Once | OROMUCOSAL | Status: AC

## 2020-10-03 MED ORDER — LACTATED RINGERS IV SOLN: INTRAVENOUS | Status: DC

## 2020-10-03 MED ORDER — ZONISAMIDE 100 MG PO CAPS
400.0000 mg | ORAL_CAPSULE | Freq: Every day | ORAL | Status: DC
  Administered 2020-10-03 – 2020-10-04 (×2): 400 mg via ORAL
  Filled 2020-10-03 (×2): qty 4

## 2020-10-03 MED ORDER — LIDOCAINE 2% (20 MG/ML) 5 ML SYRINGE
INTRAMUSCULAR | Status: AC
  Filled 2020-10-03: qty 5

## 2020-10-03 MED ORDER — SODIUM CHLORIDE 0.9 % IR SOLN
Status: DC | PRN
  Administered 2020-10-03: 1000 mL

## 2020-10-03 MED ORDER — SUGAMMADEX SODIUM 200 MG/2ML IV SOLN
INTRAVENOUS | Status: DC | PRN
  Administered 2020-10-03: 200 mg via INTRAVENOUS

## 2020-10-03 MED ORDER — CEFAZOLIN SODIUM-DEXTROSE 2-4 GM/100ML-% IV SOLN
2.0000 g | INTRAVENOUS | Status: AC
  Administered 2020-10-03: 2 g via INTRAVENOUS

## 2020-10-03 MED ORDER — MIDAZOLAM HCL 2 MG/2ML IJ SOLN
INTRAMUSCULAR | Status: AC
  Filled 2020-10-03: qty 2

## 2020-10-03 MED ORDER — SODIUM CHLORIDE (PF) 0.9 % IJ SOLN
INTRAMUSCULAR | Status: AC
  Filled 2020-10-03: qty 30

## 2020-10-03 MED ORDER — PHENYLEPHRINE 40 MCG/ML (10ML) SYRINGE FOR IV PUSH (FOR BLOOD PRESSURE SUPPORT)
PREFILLED_SYRINGE | INTRAVENOUS | Status: DC | PRN
  Administered 2020-10-03 (×5): 80 ug via INTRAVENOUS

## 2020-10-03 MED ORDER — HYDROMORPHONE HCL 1 MG/ML IJ SOLN: 0.2500 mg | INTRAMUSCULAR | Status: DC | PRN

## 2020-10-03 MED ORDER — TRANEXAMIC ACID-NACL 1000-0.7 MG/100ML-% IV SOLN
1000.0000 mg | INTRAVENOUS | Status: AC
  Administered 2020-10-03: 1000 mg via INTRAVENOUS
  Filled 2020-10-03: qty 100

## 2020-10-03 MED ORDER — TRAMADOL HCL 50 MG PO TABS
50.0000 mg | ORAL_TABLET | Freq: Four times a day (QID) | ORAL | Status: DC
  Administered 2020-10-03 – 2020-10-05 (×8): 50 mg via ORAL
  Filled 2020-10-03 (×8): qty 1

## 2020-10-03 MED ORDER — DEXAMETHASONE SODIUM PHOSPHATE 10 MG/ML IJ SOLN: 8.0000 mg | Freq: Once | INTRAMUSCULAR | Status: DC

## 2020-10-03 MED ORDER — ROCURONIUM BROMIDE 100 MG/10ML IV SOLN
INTRAVENOUS | Status: DC | PRN
  Administered 2020-10-03: 10 mg via INTRAVENOUS
  Administered 2020-10-03: 50 mg via INTRAVENOUS

## 2020-10-03 MED ORDER — METHOCARBAMOL 500 MG PO TABS: 500.0000 mg | ORAL_TABLET | Freq: Four times a day (QID) | ORAL | 0 refills | Status: DC

## 2020-10-03 MED ORDER — PROPOFOL 10 MG/ML IV BOLUS
INTRAVENOUS | Status: DC | PRN
  Administered 2020-10-03: 200 ug via INTRAVENOUS

## 2020-10-03 MED ORDER — DOXEPIN HCL 6 MG PO TABS: 6.0000 mg | ORAL_TABLET | Freq: Every evening | ORAL | Status: DC | PRN

## 2020-10-03 MED ORDER — LEVOTHYROXINE SODIUM 88 MCG PO TABS
88.0000 ug | ORAL_TABLET | Freq: Every day | ORAL | Status: DC
  Administered 2020-10-03 – 2020-10-04 (×2): 88 ug via ORAL
  Filled 2020-10-03 (×2): qty 1

## 2020-10-03 MED ORDER — SODIUM CHLORIDE (PF) 0.9 % IJ SOLN
INTRAMUSCULAR | Status: AC
  Filled 2020-10-03: qty 20

## 2020-10-03 MED ORDER — PROMETHAZINE HCL 25 MG/ML IJ SOLN: 6.2500 mg | INTRAMUSCULAR | Status: DC | PRN

## 2020-10-03 MED ORDER — METOPROLOL TARTRATE 25 MG PO TABS
25.0000 mg | ORAL_TABLET | Freq: Every day | ORAL | Status: DC
  Administered 2020-10-03 – 2020-10-04 (×2): 25 mg via ORAL
  Filled 2020-10-03 (×2): qty 1

## 2020-10-03 MED ORDER — FENTANYL CITRATE (PF) 100 MCG/2ML IJ SOLN
INTRAMUSCULAR | Status: AC
  Filled 2020-10-03: qty 2

## 2020-10-03 MED ORDER — SENNOSIDES-DOCUSATE SODIUM 8.6-50 MG PO TABS: 1.0000 | ORAL_TABLET | Freq: Every evening | ORAL | Status: DC | PRN

## 2020-10-03 MED ORDER — ROCURONIUM BROMIDE 10 MG/ML (PF) SYRINGE
PREFILLED_SYRINGE | INTRAVENOUS | Status: AC
  Filled 2020-10-03: qty 10

## 2020-10-03 MED ORDER — DEXAMETHASONE SODIUM PHOSPHATE 4 MG/ML IJ SOLN
INTRAMUSCULAR | Status: DC | PRN
  Administered 2020-10-03: 10 mg via INTRAVENOUS

## 2020-10-03 MED ORDER — KETOROLAC TROMETHAMINE 30 MG/ML IJ SOLN
30.0000 mg | Freq: Once | INTRAMUSCULAR | Status: AC
  Administered 2020-10-03: 30 mg via INTRAVENOUS

## 2020-10-03 MED ORDER — DEXAMETHASONE SODIUM PHOSPHATE 4 MG/ML IJ SOLN
INTRAMUSCULAR | Status: DC | PRN
  Administered 2020-10-03: 4 mg via PERINEURAL

## 2020-10-03 MED ORDER — BISACODYL 5 MG PO TBEC: 5.0000 mg | DELAYED_RELEASE_TABLET | Freq: Every day | ORAL | Status: DC | PRN

## 2020-10-03 MED ORDER — METHOCARBAMOL 500 MG PO TABS
500.0000 mg | ORAL_TABLET | Freq: Four times a day (QID) | ORAL | Status: DC | PRN
  Administered 2020-10-04: 500 mg via ORAL
  Filled 2020-10-03: qty 1

## 2020-10-03 MED ORDER — ONDANSETRON HCL 4 MG/2ML IJ SOLN
INTRAMUSCULAR | Status: DC | PRN
  Administered 2020-10-03: 4 mg via INTRAVENOUS

## 2020-10-03 MED ORDER — ASENAPINE MALEATE 5 MG SL SUBL
20.0000 mg | SUBLINGUAL_TABLET | Freq: Every day | SUBLINGUAL | Status: DC
  Administered 2020-10-04: 20 mg via SUBLINGUAL
  Filled 2020-10-03 (×2): qty 4

## 2020-10-03 MED ORDER — SUVOREXANT 20 MG PO TABS: 1.0000 | ORAL_TABLET | Freq: Every evening | ORAL | Status: DC | PRN

## 2020-10-03 MED ORDER — POVIDONE-IODINE 10 % EX SWAB
2.0000 "application " | Freq: Once | CUTANEOUS | Status: AC
  Administered 2020-10-03: 2 via TOPICAL

## 2020-10-03 MED ORDER — POVIDONE-IODINE 10 % EX SWAB: 2.0000 "application " | Freq: Once | CUTANEOUS | Status: DC

## 2020-10-03 MED ORDER — BUPIVACAINE LIPOSOME 1.3 % IJ SUSP
INTRAMUSCULAR | Status: DC | PRN
Start: 2020-10-03 — End: 2020-10-03
  Administered 2020-10-03 (×2): 20 mL

## 2020-10-03 MED ORDER — EST ESTROGENS-METHYLTEST 1.25-2.5 MG PO TABS: 1.0000 | ORAL_TABLET | Freq: Every day | ORAL | Status: DC

## 2020-10-03 MED ORDER — CLONIDINE HCL (ANALGESIA) 100 MCG/ML EP SOLN
EPIDURAL | Status: DC | PRN
  Administered 2020-10-03: 70 ug

## 2020-10-03 MED ORDER — SODIUM CHLORIDE (PF) 0.9 % IJ SOLN
INTRAMUSCULAR | Status: DC | PRN
  Administered 2020-10-03: 60 mL

## 2020-10-03 MED ORDER — MIDAZOLAM HCL 2 MG/2ML IJ SOLN
INTRAMUSCULAR | Status: DC | PRN
  Administered 2020-10-03: 2 mg via INTRAVENOUS

## 2020-10-03 MED ORDER — ALPRAZOLAM 0.5 MG PO TABS: 0.5000 mg | ORAL_TABLET | Freq: Three times a day (TID) | ORAL | Status: DC | PRN

## 2020-10-03 MED ORDER — FENTANYL CITRATE (PF) 250 MCG/5ML IJ SOLN
INTRAMUSCULAR | Status: AC
  Filled 2020-10-03: qty 5

## 2020-10-03 MED ORDER — ACETAMINOPHEN 325 MG PO TABS: 325.0000 mg | ORAL_TABLET | Freq: Four times a day (QID) | ORAL | Status: DC | PRN

## 2020-10-03 MED ORDER — LAMOTRIGINE 100 MG PO TABS
400.0000 mg | ORAL_TABLET | Freq: Every day | ORAL | Status: DC
  Administered 2020-10-04: 400 mg via ORAL
  Filled 2020-10-03 (×2): qty 4

## 2020-10-03 MED ORDER — STERILE WATER FOR IRRIGATION IR SOLN
Status: DC | PRN
  Administered 2020-10-03: 2000 mL

## 2020-10-03 MED ORDER — CEFAZOLIN SODIUM-DEXTROSE 1-4 GM/50ML-% IV SOLN
1.0000 g | Freq: Three times a day (TID) | INTRAVENOUS | Status: AC
  Administered 2020-10-03 – 2020-10-04 (×3): 1 g via INTRAVENOUS
  Filled 2020-10-03 (×3): qty 50

## 2020-10-03 MED ORDER — HYDROCODONE-ACETAMINOPHEN 7.5-325 MG PO TABS: 1.0000 | ORAL_TABLET | ORAL | Status: DC | PRN

## 2020-10-03 MED ORDER — FENTANYL CITRATE (PF) 100 MCG/2ML IJ SOLN
INTRAMUSCULAR | Status: DC | PRN
  Administered 2020-10-03: 100 ug via INTRAVENOUS
  Administered 2020-10-03 (×2): 50 ug via INTRAVENOUS

## 2020-10-03 MED ORDER — DOXEPIN HCL 10 MG PO CAPS
10.0000 mg | ORAL_CAPSULE | Freq: Every evening | ORAL | Status: DC | PRN
  Filled 2020-10-03: qty 1

## 2020-10-03 MED ORDER — TRAMADOL HCL 50 MG PO TABS: 50.0000 mg | ORAL_TABLET | Freq: Four times a day (QID) | ORAL | 0 refills | Status: AC | PRN

## 2020-10-03 MED ORDER — DIPHENHYDRAMINE HCL 12.5 MG/5ML PO ELIX
12.5000 mg | ORAL_SOLUTION | ORAL | Status: DC | PRN
Start: 2020-10-03 — End: 2020-10-05

## 2020-10-03 MED ORDER — DEXAMETHASONE SODIUM PHOSPHATE 10 MG/ML IJ SOLN
INTRAMUSCULAR | Status: AC
  Filled 2020-10-03: qty 1

## 2020-10-03 MED ORDER — METHOCARBAMOL 1000 MG/10ML IJ SOLN
500.0000 mg | Freq: Four times a day (QID) | INTRAVENOUS | Status: DC | PRN
  Filled 2020-10-03: qty 5

## 2020-10-03 MED ORDER — MORPHINE SULFATE (PF) 2 MG/ML IV SOLN: 0.5000 mg | INTRAVENOUS | Status: DC | PRN

## 2020-10-03 MED ORDER — SODIUM CHLORIDE 0.9 % IV SOLN: INTRAVENOUS | Status: DC

## 2020-10-03 MED ORDER — ROSUVASTATIN CALCIUM 20 MG PO TABS
40.0000 mg | ORAL_TABLET | ORAL | Status: DC
  Administered 2020-10-05: 40 mg via ORAL
  Filled 2020-10-03: qty 2

## 2020-10-03 MED ORDER — BUPIVACAINE-EPINEPHRINE 0.5% -1:200000 IJ SOLN
INTRAMUSCULAR | Status: AC
  Filled 2020-10-03: qty 1

## 2020-10-03 MED ORDER — ONDANSETRON HCL 4 MG/2ML IJ SOLN
INTRAMUSCULAR | Status: AC
  Filled 2020-10-03: qty 2

## 2020-10-03 MED ORDER — ONDANSETRON HCL 4 MG PO TABS: 4.0000 mg | ORAL_TABLET | Freq: Every day | ORAL | 1 refills | Status: AC | PRN

## 2020-10-03 MED ORDER — HYDROCODONE-ACETAMINOPHEN 5-325 MG PO TABS
1.0000 | ORAL_TABLET | ORAL | Status: DC | PRN
  Administered 2020-10-03 – 2020-10-04 (×2): 2 via ORAL
  Administered 2020-10-04: 1 via ORAL
  Filled 2020-10-03: qty 1
  Filled 2020-10-03 (×2): qty 2

## 2020-10-03 MED ORDER — KETOROLAC TROMETHAMINE 30 MG/ML IJ SOLN
INTRAMUSCULAR | Status: AC
  Filled 2020-10-03: qty 1

## 2020-10-03 MED ORDER — ENOXAPARIN SODIUM 40 MG/0.4ML IJ SOSY
40.0000 mg | PREFILLED_SYRINGE | INTRAMUSCULAR | Status: DC
  Administered 2020-10-04 – 2020-10-05 (×2): 40 mg via SUBCUTANEOUS
  Filled 2020-10-03 (×2): qty 0.4

## 2020-10-03 MED ORDER — BUPIVACAINE-EPINEPHRINE (PF) 0.5% -1:200000 IJ SOLN
INTRAMUSCULAR | Status: DC | PRN
  Administered 2020-10-03: 20 mL via PERINEURAL

## 2020-10-03 MED ORDER — SCOPOLAMINE 1 MG/3DAYS TD PT72
1.0000 | MEDICATED_PATCH | TRANSDERMAL | Status: DC
  Administered 2020-10-03: 1 via TRANSDERMAL
  Filled 2020-10-03: qty 1

## 2020-10-03 MED ORDER — DEXMEDETOMIDINE (PRECEDEX) IN NS 20 MCG/5ML (4 MCG/ML) IV SYRINGE
PREFILLED_SYRINGE | INTRAVENOUS | Status: DC | PRN
  Administered 2020-10-03: 8 ug via INTRAVENOUS

## 2020-10-03 SURGICAL SUPPLY — 63 items
ADH SKN CLS APL DERMABOND .7 (GAUZE/BANDAGES/DRESSINGS) ×1
BAG COUNTER SPONGE SURGICOUNT (BAG) IMPLANT
BAG SPEC THK2 15X12 ZIP CLS (MISCELLANEOUS) ×2
BAG SPNG CNTER NS LX DISP (BAG)
BAG ZIPLOCK 12X15 (MISCELLANEOUS) ×4 IMPLANT
BNDG CMPR MED 10X6 ELC LF (GAUZE/BANDAGES/DRESSINGS) ×1
BNDG ELASTIC 4X5.8 VLCR STR LF (GAUZE/BANDAGES/DRESSINGS) ×2 IMPLANT
BNDG ELASTIC 6X10 VLCR STRL LF (GAUZE/BANDAGES/DRESSINGS) ×1 IMPLANT
BNDG ELASTIC 6X5.8 VLCR STR LF (GAUZE/BANDAGES/DRESSINGS) ×2 IMPLANT
BNDG GAUZE ELAST 4 BULKY (GAUZE/BANDAGES/DRESSINGS) ×2 IMPLANT
BOWL SMART MIX CTS (DISPOSABLE) ×2 IMPLANT
BUR SURG PFJ MILL NEXGEN (BURR) IMPLANT
BURR SURG PFJ MILL NEXGEN (BURR) ×2
CEMENT HV SMART SET (Cement) ×2 IMPLANT
COMP FEM NEXGEN SZ3 +3.5 RT (Knees) ×2 IMPLANT
COMPONENT FEM NEXGN SZ3 +3.5RT (Knees) IMPLANT
COVER SURGICAL LIGHT HANDLE (MISCELLANEOUS) ×2 IMPLANT
CUFF TOURN SGL QUICK 34 (TOURNIQUET CUFF) ×2
CUFF TRNQT CYL 34X4.125X (TOURNIQUET CUFF) ×1 IMPLANT
DERMABOND ADVANCED (GAUZE/BANDAGES/DRESSINGS) ×1
DERMABOND ADVANCED .7 DNX12 (GAUZE/BANDAGES/DRESSINGS) IMPLANT
DRAPE SHEET LG 3/4 BI-LAMINATE (DRAPES) ×2 IMPLANT
DRAPE U-SHAPE 47X51 STRL (DRAPES) ×2 IMPLANT
DRESSING AQUACEL AG SP 3.5X10 (GAUZE/BANDAGES/DRESSINGS) IMPLANT
DRSG AQUACEL AG ADV 3.5X10 (GAUZE/BANDAGES/DRESSINGS) ×1 IMPLANT
DRSG AQUACEL AG SP 3.5X10 (GAUZE/BANDAGES/DRESSINGS) ×2
DRSG PAD ABDOMINAL 8X10 ST (GAUZE/BANDAGES/DRESSINGS) ×2 IMPLANT
DURAPREP 26ML APPLICATOR (WOUND CARE) ×4 IMPLANT
ELECT NDL TIP 2.8 STRL (NEEDLE) IMPLANT
ELECT NEEDLE TIP 2.8 STRL (NEEDLE) ×2 IMPLANT
GAUZE SPONGE 4X4 12PLY STRL (GAUZE/BANDAGES/DRESSINGS) ×1 IMPLANT
GLOVE SRG 8 PF TXTR STRL LF DI (GLOVE) ×1 IMPLANT
GLOVE SURG NEOPR MICRO LF SZ8 (GLOVE) ×2 IMPLANT
GLOVE SURG ORTHO LTX SZ9 (GLOVE) ×2 IMPLANT
GLOVE SURG POLYISO LF SZ7 (GLOVE) ×2 IMPLANT
GLOVE SURG UNDER POLY LF SZ7.5 (GLOVE) ×2 IMPLANT
GLOVE SURG UNDER POLY LF SZ8 (GLOVE) ×2
GOWN STRL REUS W/TWL LRG LVL3 (GOWN DISPOSABLE) ×2 IMPLANT
GOWN STRL REUS W/TWL XL LVL3 (GOWN DISPOSABLE) ×2 IMPLANT
HANDPIECE INTERPULSE COAX TIP (DISPOSABLE) ×2
KIT TURNOVER KIT A (KITS) ×2 IMPLANT
NS IRRIG 1000ML POUR BTL (IV SOLUTION) ×4 IMPLANT
PACK TOTAL KNEE CUSTOM (KITS) ×2 IMPLANT
PENCIL SMOKE EVACUATOR (MISCELLANEOUS) IMPLANT
PROTECTOR NERVE ULNAR (MISCELLANEOUS) ×2 IMPLANT
SCREW HEADED 33MM KNEE (MISCELLANEOUS) ×3 IMPLANT
SET HNDPC FAN SPRY TIP SCT (DISPOSABLE) ×1 IMPLANT
SPONGE SURGIFOAM ABS GEL 100 (HEMOSTASIS) IMPLANT
STEM POLY PAT PLY 32M KNEE (Knees) ×1 IMPLANT
STOCKINETTE 6  STRL (DRAPES) ×2
STOCKINETTE 6 STRL (DRAPES) ×1 IMPLANT
STRIP CLOSURE SKIN 1/2X4 (GAUZE/BANDAGES/DRESSINGS) ×1 IMPLANT
SUT BONE WAX W31G (SUTURE) ×1 IMPLANT
SUT MNCRL AB 3-0 PS2 18 (SUTURE) ×2 IMPLANT
SUT VIC AB 1 CT1 27 (SUTURE) ×6
SUT VIC AB 1 CT1 27XBRD ANTBC (SUTURE) ×3 IMPLANT
SUT VIC AB 2-0 CT1 27 (SUTURE) ×4
SUT VIC AB 2-0 CT1 TAPERPNT 27 (SUTURE) ×2 IMPLANT
SUT VLOC 180 0 24IN GS25 (SUTURE) ×2 IMPLANT
TAPE STRIPS DRAPE STRL (GAUZE/BANDAGES/DRESSINGS) ×2 IMPLANT
TRAY FOLEY MTR SLVR 16FR STAT (SET/KITS/TRAYS/PACK) ×2 IMPLANT
WATER STERILE IRR 1000ML POUR (IV SOLUTION) ×2 IMPLANT
WRAP KNEE MAXI GEL POST OP (GAUZE/BANDAGES/DRESSINGS) ×2 IMPLANT

## 2020-10-03 NOTE — Progress Notes (Signed)
Orthopedic Tech Progress Note Patient Details:  Cynthia [name] November 28, 1965 [id number]  Ortho Devices Type of Ortho Device: Knee Immobilizer Ortho Device/Splint Location: right Ortho Device/Splint Interventions: Application   Post Interventions Patient Tolerated: Well Instructions Provided: Care of device  [place] Pink 10/03/2020, 2:03 PM

## 2020-10-03 NOTE — Transfer of Care (Signed)
Immediate Anesthesia Transfer of Care Note  Patient: Cynthia [name]  Procedure(s) Performed: PATELLA-FEMORAL ARTHROPLASTY (Right: Knee)  Patient Location: PACU  Anesthesia Type:General and Regional  Level of Consciousness: drowsy  Airway & Oxygen Therapy: Patient Spontanous Breathing and Patient connected to face mask oxygen  Post-op Assessment: Report given to RN and Post -op Vital signs reviewed and stable  Post vital signs: Reviewed and stable  Last Vitals:  Vitals Value Taken Time  BP 119/67 10/03/20 0951  Temp    Pulse 95 10/03/20 0952  Resp 20 10/03/20 0952  SpO2 95 % 10/03/20 0952  Vitals shown include unvalidated device data.  Last Pain:  Vitals:   10/03/20 0543  TempSrc:   PainSc: 0-No pain         Complications: No notable events documented.

## 2020-10-03 NOTE — Progress Notes (Signed)
Physical Therapy Treatment Patient Details Name: Cynthia [name] MRN: [id number] DOB: [birth year] Today's Date: 10/03/2020    History of Present Illness Pt is a 55 y.o. female s/p R patellofemoral arthroplasty on 10/03/20. PMH significant for depression, hypothyroidism, SBO, hyperlipemia, L achilles tendon surgery, L shoulder surgery (2010).    PT Comments    Pt is slowly progressing toward acute PT goals with progression of ambulation. Mobility performed with KI donned this session, pt continued to displayed intermittent Rt knee buckling with ambulation ~14f requiring MIN-MOD A for stability and cues for step to gait pattern. Attempted stair training with multiple techniques, pt unable to safely perform with significant knee buckling requiring MOD-MAX A from therapist for safety to prevent fall. Pt is currently NOT at safe mobility level for return home. Pt will benefit from continued skilled PT to increase their independence and maximize safety with mobility.    Follow Up Recommendations  Follow surgeon's recommendation for DC plan and follow-up therapies;Outpatient PT     Equipment Recommendations  None recommended by PT (pt owns RW)    Recommendations for Other Services       Precautions / Restrictions Precautions Precautions: Fall Required Braces or Orthoses: Knee Immobilizer - Right Knee Immobilizer - Right: On when out of bed or walking (Pt with MOD-MAX knee buckling during PT session) Restrictions Weight Bearing Restrictions: No Other Position/Activity Restrictions: WBAT    Mobility  Bed Mobility Overal bed mobility: Needs Assistance Bed Mobility: Supine to Sit;Sit to Supine     Supine to sit: Min guard;HOB elevated Sit to supine: Min guard   General bed mobility comments: MIN guard provided for safety and to assist with bringing Rt LE fully on to bed at end of session.    Transfers Overall transfer level: Needs assistance Equipment used: Rolling walker (2  wheeled) Transfers: Sit to/from Stand Sit to Stand: Min assist         General transfer comment: KI donned for mobility. x2 from EOB; MIN A provided for stability with cues for safe hand placement. Pt with intermittent Rt knee buckling with lateral weight shifting and performance of standing marches requiring up to MIN A for stability. Cues provided for knee extension and incr WB through UEs on RW for support. Therapist readjusted brace, pt with slight improvement in significance of Rt knee buckling with KI donned, mild buckling still observed.  Ambulation/Gait Ambulation/Gait assistance: Min assist;Mod assist Gait Distance (Feet): 30 Feet Assistive device: Rolling walker (2 wheeled) Gait Pattern/deviations: Step-to pattern;Decreased stride length;Decreased weight shift to right;Decreased stance time - right Gait velocity: decr   General Gait Details: KI donned; pt with shuffling steps/decreased foot clearance of B LEs with intermittent knee buckling with gait requiring MIN-MOD A for stability. Inconsistent improvement in knee buckling and increased foot clearance observed following cues for Rt knee extension and incr WB through UEs on RW for support. Pt with significantly decreased gait speed stating "I don't know about this, my leg just feels heavy."   Stairs Stairs: Yes Stairs assistance: Mod assist;Max assist Stair Management: No rails;One rail Right;Sideways;Forwards;With walker Number of Stairs: 1 General stair comments: Therapeutic rest break requiring prior to stair training. Therapist reviewed and demonstrated multiple techniques for stair training with pt and family. Attempted stair training with use of RW for B UE support and sideways with B UEs on single railing, cues provided for "up with the good." Pt demonstrated significant Rt knee buckling with KI donned requiring up to MOD-MAX A from therapist for  stability/ fall prevention. Further stair training deferred to maintain pt  safety.   Wheelchair Mobility    Modified Rankin (Stroke Patients Only)       Balance Overall balance assessment: Needs assistance Sitting-balance support: Feet supported Sitting balance-Leahy Scale: Fair     Standing balance support: Bilateral upper extremity supported;During functional activity Standing balance-Leahy Scale: Poor Standing balance comment: use of external support                            Cognition Arousal/Alertness: Awake/alert Behavior During Therapy: WFL for tasks assessed/performed Overall Cognitive Status: Within Functional Limits for tasks assessed                                        Exercises Total Joint Exercises Ankle Circles/Pumps: AROM;Both;20 reps;Supine    General Comments        Pertinent Vitals/Pain Pain Assessment: Faces Faces Pain Scale: Hurts little more Pain Location: Rt knee Pain Descriptors / Indicators: Burning;Tightness;Sore Pain Intervention(s): Limited activity within patient's tolerance;Monitored during session;Repositioned    Home Living Family/patient expects to be discharged to:: Private residence Living Arrangements: Spouse/significant other;Children                  Prior Function            PT Goals (current goals can now be found in the care plan section) Acute Rehab PT Goals Patient Stated Goal: Be able to move safer to go home PT Goal Formulation: With patient/family Time For Goal Achievement: 10/17/20 Potential to Achieve Goals: Good Progress towards PT goals: Progressing toward goals    Frequency    7X/week      PT Plan Current plan remains appropriate    Co-evaluation              AM-PAC PT "6 Clicks" Mobility   Outcome Measure  Help needed turning from your back to your side while in a flat bed without using bedrails?: A Little Help needed moving from lying on your back to sitting on the side of a flat bed without using bedrails?: A  Little Help needed moving to and from a bed to a chair (including a wheelchair)?: A Lot Help needed standing up from a chair using your arms (e.g., wheelchair or bedside chair)?: A Little Help needed to walk in hospital room?: A Lot Help needed climbing 3-5 steps with a railing? : Total 6 Click Score: 14    End of Session Equipment Utilized During Treatment: Gait belt Activity Tolerance: Other (comment) (unable to perform further mobility safely with knee buckling) Patient left: in bed;with family/visitor present Nurse Communication: Mobility status PT Visit Diagnosis: Unsteadiness on feet (R26.81);Muscle weakness (generalized) (M62.81);Difficulty in walking, not elsewhere classified (R26.2)     Time: [id number] PT Time Calculation (min) (ACUTE ONLY): 50 min  Charges:  $Therapeutic Activity: 38-52 mins                    Festus Barren PT, DPT  Acute Rehabilitation Services  Office [phone]   10/03/2020, 8:13 PM

## 2020-10-03 NOTE — H&P (Addendum)
Patella-Femoral KNEE ADMISSION H&P  Patient is being admitted for right Patella-femoral knee arthroplasty.  Subjective:  Chief Complaint:right knee pain.  HPI: Cynthia [name], 55 y.o. female, has a history of pain and functional disability in the right knee due to arthritis of the patella femoral joint and has failed non-surgical conservative treatments for greater than 12 weeks to includeNSAID's and/or analgesics, corticosteriod injections, viscosupplementation injections, supervised PT with diminished ADL's post treatment, and activity modification.  Onset of symptoms was gradual, starting 3 years ago with gradually worsening course since that time. The patient noted prior procedures on the knee to include  arthroscopy and menisectomy on the right knee(s).  Patient currently rates pain in the right knee(s) at 4 out of 10 with activity. Patient has worsening of pain with activity and weight bearing, pain that interferes with activities of daily living, and pain with passive range of motion.  Patient has evidence of periarticular osteophytes and joint space narrowing by imaging studies. This patient has had  No previous injury . There is no active infection.  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   Abdominal pain 08/16/2015   Generalized abdominal pain    Intractable cyclical vomiting with nausea    Pericarditis    Pericarditis, acute 05/03/2010   Obesity [id number]   Diastolic congestive heart failure ([place])    Hyperlipemia    Hypothyroidism    History of depression    History of migraine    Rosacea    Anxiety     Past Medical History:  Diagnosis Date   Allergy    Anxiety    Bladder disorder    Colitis 2006   occurred in presnce of SBO , no issues since    Colon polyps    Complication of anesthesia    problems waking up and PONV   Depression    Diverticulosis    Headache    Hyperlipemia    Hypothyroidism    Kidney stone    Paroxysmal ventricular tachycardia ([place]) 2015   Pericarditis 2015   PONV (postoperative nausea and vomiting)    Rapid heart rate    maybe once a month since pericarditis   Rosacea    SBO (small bowel obstruction) ([place]) 08/16/2015   Vitamin D deficiency     Past Surgical History:  Procedure Laterality Date   ACHILLES TENDON SURGERY Left    APPENDECTOMY     CARDIAC CATHETERIZATION  11/15/2008   SMOOTH AND NORMAL   CHOLECYSTECTOMY     COLONOSCOPY     LAPAROSCOPIC ASSISTED VENTRAL HERNIA REPAIR N/A 08/14/2017   Procedure: LAPAROSCOPIC ASSISTED [place], CECOPEXY;  Surgeon: Johnathan Hausen, MD;  Location: [facility];  Service: General;  Laterality: N/A;   LAPAROSCOPY N/A 10/26/2015   Procedure: diagnostic LAPAROSCOPY LAPAROTOMY AND APPENDECTOMY;  Surgeon: Johnathan Hausen, MD;  Location: [facility];  Service: General;  Laterality: N/A;   POLYPECTOMY  02/2009   SHOULDER SURGERY  2010   left   TOTAL ABDOMINAL HYSTERECTOMY W/ BILATERAL SALPINGOOPHORECTOMY  2005   UPPER GASTROINTESTINAL ENDOSCOPY      Current Facility-Administered  Medications  Medication Dose Route Frequency Provider Last Rate Last Admin   bupivacaine liposome (EXPAREL) 1.3 % injection 266 mg  20 mL Other On Call to OR Sydnee Cabal, MD       ceFAZolin (ANCEF) IVPB 2g/100 mL premix  2 g Intravenous On Call to [place], Darianna Amy R, PA       ceFAZolin (ANCEF) IVPB 2g/100 mL premix  2 g Intravenous On Call to [place], Felicha Frayne R, PA       dexamethasone (DECADRON) injection 8 mg  8 mg Intravenous Once Layni Kreamer R, PA       lactated ringers infusion   Intravenous Continuous Maysie Parkhill,  Burley R, PA       lactated ringers infusion   Intravenous Continuous Duane Boston, MD 10 mL/hr at 10/03/20 0556 New Bag at 10/03/20 0556   povidone-iodine 10 % swab 2 application  2 application Topical Once Weltha Cathy R, PA       scopolamine (TRANSDERM-SCOP) 1 MG/3DAYS 1.5 mg  1 patch Transdermal Q72H Nolon Nations, MD       tranexamic acid (CYKLOKAPRON) IVPB 1,000 mg  1,000 mg Intravenous To OR Holton Sidman R, PA       Allergies  Allergen Reactions   Amoxicillin Other (See Comments)    Face, eyes, chest turned red.  Has tolerated Ancef.     Ampicillin Other (See Comments)    SERUM SICKNESS REACTION Has patient had a PCN reaction causing immediate rash, facial/tongue/throat swelling, SOB or lightheadedness with hypotension: Yes Has patient had a PCN reaction causing severe rash involving mucus membranes or skin necrosis: No Has patient had a PCN reaction that required hospitalization: No Has patient had a PCN reaction occurring within the last 10 years: No If all of the above answers are "NO", then may proceed with Cephalosporin use..  TOLERATED ANCEF.    Apple Fruit Extract Itching    Mouth itches inside, but no breathing impairment; treats with Benadryl   Cherry Extract Itching    Mouth itches inside, but no breathing impairment; treats with Benadryl   Fruit & Vegetable Daily [Nutritional Supplements] Itching    FRUITS - Mouth itches inside, but no breathing impairment; treats with Benadryl   Imitrex [Sumatriptan] Other (See Comments)    Chest Pain   Other Itching    Feline dander = Mouth itches inside, but no breathing impairment; treats with Benadryl   Peach [Prunus Persica] Itching    Mouth itches inside, but no breathing impairment; treats with Benadryl   Strawberry Extract Itching    Mouth itches inside, but no breathing impairment; treats with Benadryl   Zoloft [Sertraline Hcl] Other (See Comments)    Causes hyperactivity    Social History   Tobacco Use    Smoking status: Never   Smokeless tobacco: Never  Substance Use Topics   Alcohol use: No    Family History  Adopted: Yes  Problem Relation Age of Onset   Breast cancer Neg Hx      Review of Systems  Constitutional: Negative.   HENT: Negative.    Eyes: Negative.   Respiratory: Negative.    Cardiovascular: Negative.   Gastrointestinal: Negative.   Endocrine: Negative.   Genitourinary: Negative.   Musculoskeletal:  Positive for arthralgias, joint swelling and myalgias.  Skin: Negative.   Allergic/Immunologic: Negative.   Neurological: Negative.   Hematological: Negative.   Psychiatric/Behavioral: Negative.    All other systems reviewed and are negative.  Objective:  Physical Exam Vitals reviewed.  Constitutional:      Appearance: Normal appearance.  HENT:     Head: Normocephalic and atraumatic.  Eyes:     Extraocular Movements: Extraocular movements intact.  Neck:     Vascular: No carotid bruit.  Cardiovascular:     Rate and Rhythm: Normal rate and regular rhythm.     Pulses: Normal pulses.     Heart sounds: Normal heart sounds. No murmur heard.   No friction rub. No gallop.  Pulmonary:     Effort: Pulmonary effort is normal.     Breath sounds: Normal breath sounds.  Musculoskeletal:     Cervical back: Neck supple. No tenderness.     Comments: On exam of their right knee, no skin changes or effusions noted. Patient has tenderness with palpation over the patellofemoral joint spaces. Full extension, full flexion. Crepitus under the patella with flexion and extension.No laxity of varus valgus pressure. 5 out of 5 strength with resisted knee flexion and extension. Calf is supple. Neurovascularly intact in their right lower extremity.  Skin:    General: Skin is warm and dry.     Capillary Refill: Capillary refill takes less than 2 seconds.  Neurological:     General: No focal deficit present.     Mental Status: She is alert and oriented to person, place, and time.   Psychiatric:        Mood and Affect: Mood normal.        Behavior: Behavior normal.        Thought Content: Thought content normal.        Judgment: Judgment normal.    Vital signs in last 24 hours: Temp:  [98 F (36.7 C)] 98 F (36.7 C) (08/31 0530) Pulse Rate:  [96] 96 (08/31 0530) Resp:  [17] 17 (08/31 0530) BP: (125)/(66) 125/66 (08/31 0530) SpO2:  [97 %] 97 % (08/31 0530) Weight:  [102.1 kg] 102.1 kg (08/31 0543)  Labs:   Estimated body mass index is 35.25 kg/m as calculated from the following:   Height as of this encounter: '5\' 7"'$  (1.702 m).   Weight as of this encounter: 102.1 kg.   Imaging Review Plain radiographs demonstrate moderate degenerative joint disease of the patella femoral joint of the right knee(s). The overall alignment isneutral. The bone quality appears to be good for age and reported activity level.      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 The treatment options including medical management, injection therapy arthroscopy and arthroplasty were discussed at length. The risks and benefits of partial knee arthroplasty were presented and reviewed. The risks due to aseptic loosening, infection, stiffness, patella tracking problems, thromboembolic complications and other imponderables were discussed. The patient acknowledged the explanation, agreed to proceed with the plan and consent was signed. Patient is being admitted for inpatient treatment for surgery, pain control, PT, OT, prophylactic antibiotics, VTE prophylaxis, progressive ambulation and ADL's and discharge planning. The patient is planning to be discharged home with family and outpatient PT services This is planned as an  outpatient surgery.

## 2020-10-03 NOTE — Anesthesia Procedure Notes (Signed)
Procedure Name: Intubation Date/Time: 10/03/2020 7:54 AM Performed by: Lieutenant Diego, CRNA Pre-anesthesia Checklist: Patient identified, Emergency Drugs available, Suction available and Patient being monitored Patient Re-evaluated:Patient Re-evaluated prior to induction Oxygen Delivery Method: Circle system utilized Preoxygenation: Pre-oxygenation with 100% oxygen Induction Type: IV induction Ventilation: Mask ventilation without difficulty Laryngoscope Size: Miller and 2 Grade View: Grade I Tube type: Oral Tube size: 7.0 mm Number of attempts: 1 Airway Equipment and Method: Stylet Placement Confirmation: ETT inserted through vocal cords under direct vision, positive ETCO2 and breath sounds checked- equal and bilateral Secured at: 24 cm Tube secured with: Tape Dental Injury: Teeth and Oropharynx as per pre-operative assessment

## 2020-10-03 NOTE — Interval H&P Note (Signed)
History and Physical Interval Note:  10/03/2020 7:43 AM  Cynthia [name]  has presented today for surgery, with the diagnosis of right patellofemoral osteoarthritis.  The various methods of treatment have been discussed with the patient and family. After consideration of risks, benefits and other options for treatment, the patient has consented to  Procedure(s) with comments: PATELLA-FEMORAL ARTHROPLASTY (Right) - adductor canal block 120MIN as a surgical intervention.  The patient's history has been reviewed, patient examined, no change in status, stable for surgery.  I have reviewed the patient's chart and labs.  Questions were answered to the patient's satisfaction.     Kajal Scalici ANDREW

## 2020-10-03 NOTE — Op Note (Signed)
Preop diagnosis right knee isolated patellofemoral end-stage osteoarthritis Postop diagnosis same Right knee patellofemoral arthroplasty Surgeon Hart Robinsons, MD Assistant Elenor Legato, PA-C Anesthesia abductor canal with general Estimated blood loss minimal Drains none Complications none Tourniquet time 55 minutes Implants Biomet Zimmer size 32 patella size 3 femoral trochlea  Operative details Patient was encountered in the holding area correct side marked signed appropriately chart reviewed side.  Correct side marked.  Abductor canal block given.  IV antibiotics were given within 1 hour of the surgical incision time.  Taken to the operating room placed supine position under general anesthesia..  Right lower extremity elevated prepped with DuraPrep draped into a sterile fashion.  After another timeout exsanguinated with an Esmarch tourniquet plated to 325 mm work mercury.  Straight midline incision made the skin and subcutaneous tissue.  Medial parapatellar arthrotomy was performed.  Patella was everted end-stage arthritis of patella and also the femoral trochlea ACL medial lateral compartments were intact patella was found to be 32 mm size Prometa bone was resected lock holes were made.  Knee was flexed epicondylar axis was marked femoral trochlea center was marked in line with the shaft starter hole medial to the distal femur canal was irrigated if it was clear intramedullary was placed appropriate bone was resected out the anterior aspect of the femur with 3 degrees of external rotation.  The femur was found to be a size 3 jig was applied bur was utilized to cut the trough of the jig was applied keels and punch was performed.  This time with a size 32 patella trial 3 femur we had excellent coverage no overhang and the patella tracked anatomically without any lift off or catch all trials removed the knee was irrigated pulsatile lavage the components were cemented into place utilizing modern cement  technique size 3 femur size 32 patella after cemented cured excess cement was removed he was again checked patella tracked anatomically.  ACL was irrigated and closure.  A periosteal block was done with 60 cc of saline 20 cc of Exparel arthrotomies closed in 2 layers of Vicryl followed by running locking suture subcu Vicryl skin with a subcuticular Monocryl suture Dermabond was applied and sterile dressing.  Tourniquet deflated normal circulation foot neck in the case.  She is awakened taken operating PACU stable condition.  She was stabilized in PACU and discharged to home.  Physical therapy was started physical therapy will start tomorrow and should be allowed weightbearing as tolerated.  To help with patient positioning prepping draping technical surgical assistance throughout entire case wound closure anatomic localization retraction of neurovascular structures Ms. Almedia Balls, PA-C assistance was needed throughout entire case.

## 2020-10-03 NOTE — Anesthesia Postprocedure Evaluation (Signed)
Anesthesia Post Note  Patient: Cynthia [name]  Procedure(s) Performed: PATELLA-FEMORAL ARTHROPLASTY (Right: Knee)     Patient location during evaluation: PACU Anesthesia Type: General Level of consciousness: sedated and patient cooperative Pain management: pain level controlled Vital Signs Assessment: post-procedure vital signs reviewed and stable Respiratory status: spontaneous breathing Cardiovascular status: stable Anesthetic complications: no   No notable events documented.  Last Vitals:  Vitals:   10/03/20 1235 10/03/20 1245  BP: 120/73 126/80  Pulse:  92  Resp:  18  Temp: 36.4 C   SpO2:  94%    Last Pain:  Vitals:   10/03/20 1245  TempSrc:   PainSc: [place]

## 2020-10-03 NOTE — Anesthesia Procedure Notes (Signed)
Anesthesia Regional Block: Adductor canal block   Pre-Anesthetic Checklist: , timeout performed,  Correct Patient, Correct Site, Correct Laterality,  Correct Procedure, Correct Position, site marked,  Risks and benefits discussed,  Surgical consent,  Pre-op evaluation,  At surgeon's request and post-op pain management  Laterality: Lower and Right  Prep: chloraprep       Needles:  Injection technique: Single-shot  Needle Type: Stimiplex     Needle Length: 9cm  Needle Gauge: 21     Additional Needles:   Procedures:,,,, ultrasound used (permanent image in chart),,    Narrative:  Start time: 10/03/2020 7:10 AM End time: 10/03/2020 7:20 AM Injection made incrementally with aspirations every 5 mL.  Performed by: Personally  Anesthesiologist: Nolon Nations, MD  Additional Notes: BP cuff, EKG monitors applied. Sedation begun. Artery and nerve location verified with ultrasound. Anesthetic injected incrementally (50m), slowly, and after negative aspirations under direct u/s guidance. Good fascial/perineural spread. Tolerated well.

## 2020-10-03 NOTE — Evaluation (Signed)
Physical Therapy Evaluation Patient Details Name: Cynthia [name] MRN: [id number] DOB: [birth year] Today's Date: 10/03/2020   History of Present Illness  Pt is a 55 y.o. female s/p R patellofemoral arthroplasty on 10/03/20. PMH significant for depression, hypothyroidism, SBO, hyperlipemia, L achilles tendon surgery, L shoulder surgery (2010).  Clinical Impression  Cynthia [name] is a 55 y.o. female POD 0 s/p Rt patellofemoral arthroplasty . Patient reports independence with mobility at baseline. Patient is now limited by functional impairments (see PT problem list below) and requires up to MIN A for transfers. Pt unable to progress to ambulation due to increased Rt knee buckling, deferred to maintain pt safety. Will attempt 2nd PT session today with use of Rt KI for improved stability to assess pt safety with mobility further. Pt is currently NOT at safe mobility level for return home. Pt will benefit from continued skilled PT to increase their independence and maximize safety with mobility.       Follow Up Recommendations Follow surgeon's recommendation for DC plan and follow-up therapies;Outpatient PT    Equipment Recommendations  None recommended by PT (pt owns RW)    Recommendations for Other Services       Precautions / Restrictions Precautions Precautions: Fall Restrictions Weight Bearing Restrictions: No Other Position/Activity Restrictions: WBAT      Mobility  Bed Mobility Overal bed mobility: Needs Assistance Bed Mobility: Supine to Sit;Sit to Supine     Supine to sit: Min guard;HOB elevated Sit to supine: Min guard   General bed mobility comments: MIN guard provided for safety and to assist with bringing Rt LE fully on to bed.    Transfers Overall transfer level: Needs assistance Equipment used: Rolling walker (2 wheeled) Transfers: Sit to/from Stand Sit to Stand: Min assist         General transfer comment: X2 from EOB; MIN A provided for stability with cues for  safe hand placememnt. Pt noted to have knee buckling initally upon standing. Cues provided for knee extension and incr WB through UEs on RW for support. Pt with intermittent Rt knee buckling with lateral weight shifting and performance of standing marches requrinig up to MIN A for stability. Pt staing "I can't keep it straight, it feels really heavy, I feel like a drunk person. "Further mobility deferred to maintain pt safety.  Ambulation/Gait                Stairs            Wheelchair Mobility    Modified Rankin (Stroke Patients Only)       Balance Overall balance assessment: Needs assistance Sitting-balance support: Feet supported Sitting balance-Leahy Scale: Fair     Standing balance support: Bilateral upper extremity supported;During functional activity Standing balance-Leahy Scale: Poor Standing balance comment: use of external support                             Pertinent Vitals/Pain Pain Assessment: 0-10 Pain Score: 3  Pain Location: Rt knee Pain Descriptors / Indicators: Burning;Tightness;Sore Pain Intervention(s): Limited activity within patient's tolerance;Monitored during session;Repositioned;Ice applied    Home Living Family/patient expects to be discharged to:: Private residence Living Arrangements: Spouse/significant other;Children Available Help at Discharge: Family;Available 24 hours/day Type of Home: House Home Access: Stairs to enter Entrance Stairs-Rails: Right Entrance Stairs-Number of Steps: 3 Home Layout: Two level;1/2 bath on main level;Able to live on main level with bedroom/bathroom Home Equipment: Gilford Rile - 2 wheels;Cane -  single point;Crutches Additional Comments: Pt lives with husband who will be available to assist at home and teenage son who is available intermittently.    Prior Function Level of Independence: Independent               Hand Dominance   Dominant Hand: Right    Extremity/Trunk Assessment    Upper Extremity Assessment Upper Extremity Assessment: Overall WFL for tasks assessed    Lower Extremity Assessment Lower Extremity Assessment: RLE deficits/detail RLE Deficits / Details: pt withgood quad set strength and 5/5 B DF/PF strength. Pt able to perform SLR, mild extensor lag noted. RLE Sensation: WNL    Cervical / Trunk Assessment Cervical / Trunk Assessment: Normal  Communication   Communication: No difficulties  Cognition Arousal/Alertness: Awake/alert Behavior During Therapy: WFL for tasks assessed/performed Overall Cognitive Status: Within Functional Limits for tasks assessed                                 General Comments: Pt intermittently requiring increased time for answering questions, reports she is still feeling a little groggy from anesthesia.      General Comments      Exercises     Assessment/Plan    PT Assessment Patient needs continued PT services  PT Problem List Decreased strength;Decreased range of motion;Decreased activity tolerance;Decreased balance;Decreased mobility;Decreased knowledge of use of DME;Pain       PT Treatment Interventions DME instruction;Gait training;Stair training;Functional mobility training;Therapeutic activities;Therapeutic exercise;Neuromuscular re-education;Patient/family education    PT Goals (Current goals can be found in the Care Plan section)  Acute Rehab PT Goals Patient Stated Goal: Be able to move safer to go home PT Goal Formulation: With patient/family Time For Goal Achievement: 10/17/20 Potential to Achieve Goals: Good    Frequency 7X/week   Barriers to discharge        Co-evaluation               AM-PAC PT "6 Clicks" Mobility  Outcome Measure Help needed turning from your back to your side while in a flat bed without using bedrails?: A Little Help needed moving from lying on your back to sitting on the side of a flat bed without using bedrails?: A Little Help needed moving to  and from a bed to a chair (including a wheelchair)?: Total Help needed standing up from a chair using your arms (e.g., wheelchair or bedside chair)?: A Little Help needed to walk in hospital room?: Total Help needed climbing 3-5 steps with a railing? : Total 6 Click Score: 12    End of Session Equipment Utilized During Treatment: Gait belt Activity Tolerance: Patient limited by lethargy;Other (comment) (unable to perform further mobility safely with knee buckling) Patient left: in bed;with family/visitor present Nurse Communication: Mobility status PT Visit Diagnosis: Unsteadiness on feet (R26.81);Muscle weakness (generalized) (M62.81);Difficulty in walking, not elsewhere classified (R26.2)    Time: [id number] PT Time Calculation (min) (ACUTE ONLY): 29 min   Charges:   PT Evaluation $PT Eval Low Complexity: 1 Low PT Treatments $Therapeutic Activity: 8-22 mins        Festus Barren PT, DPT  Acute Rehabilitation Services  Office [phone]  10/03/2020, 2:16 PM

## 2020-10-04 ENCOUNTER — Encounter (HOSPITAL_COMMUNITY): Payer: Self-pay | Admitting: Specialist

## 2020-10-04 DIAGNOSIS — M1711 Unilateral primary osteoarthritis, right knee: Secondary | ICD-10-CM | POA: Diagnosis not present

## 2020-10-04 LAB — HIV ANTIBODY (ROUTINE TESTING W REFLEX): HIV Screen 4th Generation wRfx: NONREACTIVE

## 2020-10-04 NOTE — Progress Notes (Signed)
Subjective: 1 Day Post-Op Procedure(s) (LRB): PATELLA-FEMORAL ARTHROPLASTY (Right) Patient reports pain as mild.   Patient does report some numbness and tingling in her right lower extremity.  Pain is well controlled with PO pain meds Failed PT yesterday with difficulty holding her own weight and ambulation  Objective: Vital signs in last 24 hours: Temp:  [97.1 F (36.2 C)-98 F (36.7 C)] 97.8 F (36.6 C) (09/01 0101) Pulse Rate:  [86-106] 91 (09/01 0609) Resp:  [13-20] 18 (09/01 0609) BP: (95-126)/(62-81) 115/77 (09/01 0609) SpO2:  [91 %-98 %] 93 % (09/01 0609)  Intake/Output from previous day: 08/31 0701 - 09/01 0700 In: 920 [P.O.:120; I.V.:800] Out: 300 [Urine:250; Blood:50] Intake/Output this shift: Total I/O In: -  Out: 250 [Urine:250]  Recent Labs    10/03/20 1004 10/03/20 1721  HGB 12.9 8.9*   Recent Labs    10/03/20 1004 10/03/20 1721  WBC 9.6 12.6*  RBC 4.29 3.03*  HCT 40.2 26.2*  PLT 237 228   Recent Labs    10/03/20 1004 10/03/20 1721  NA 139  --   K 4.4  --   CL 107  --   CO2 25  --   BUN 13  --   CREATININE 1.01* 0.76  GLUCOSE 112*  --   CALCIUM 8.9  --    No results for input(s): LABPT, INR in the last 72 hours.  Neurologically intact Neurovascular intact Sensation intact distally Intact pulses distally Dorsiflexion/Plantar flexion intact Incision: dressing C/D/I Compartment soft   Assessment/Plan: 1 Day Post-Op Procedure(s) (LRB): PATELLA-FEMORAL ARTHROPLASTY (Right) Up with therapy, WBAT, Okay to ambulate without KI if able to Plan for discharge today or tomorrow depending on when she pass PT DVT ppx: Lovenox injections in hospital, ASA 81 mg BID when D/C     Patient's anticipated LOS is less than 2 midnights, meeting these requirements: - Younger than 75 - Lives within 1 hour of care - Has a competent adult at home to recover with post-op recover - NO history of  - Chronic pain requiring opiods  - Diabetes  -  Coronary Artery Disease  - Heart failure  - Heart attack  - Stroke  - DVT/VTE  - Cardiac arrhythmia  - Respiratory Failure/COPD  - Renal failure  - Anemia  - Advanced Liver disease     Derrick Ravel [phone] 10/04/2020, 6:27 AM

## 2020-10-04 NOTE — TOC Transition Note (Signed)
Transition of Care [facility]) - CM/SW Discharge Note  Patient Details  Name: Cynthia [name] MRN: [id number] Date of Birth: 25-Jun-1965  [facility] CM/SW Contact:  Sherie Don, LCSW Phone Number: 10/04/2020, 11:12 AM  Clinical Narrative: Patient is expected to discharge home after working with PT. CSW met with patient and her husband to confirm discharge plan and needs. Patient will go to OPPT at [facility]. Patient has a rolling walker, crutches, knee scooter, and grab bars by the toilets at home so there are no DME needs at this time. TOC signing off.  Final next level of care: OP Rehab Barriers to Discharge: No Barriers Identified  Patient Goals and CMS Choice Patient states their goals for this hospitalization and ongoing recovery are:: Discharge home with [facility] CMS Medicare.gov Compare Post Acute Care list provided to:: Patient Choice offered to / list presented to : NA  Discharge Plan and Services         DME Arranged: N/A DME Agency: NA  Readmission Risk Interventions No flowsheet data found.

## 2020-10-04 NOTE — Progress Notes (Signed)
Physical Therapy Treatment Patient Details Name: Cynthia [name] MRN: [id number] DOB: [birth year] Today's Date: 10/04/2020    History of Present Illness Pt is a 55 y.o. female s/p R patellofemoral arthroplasty on 10/03/20. PMH significant for depression, hypothyroidism, SBO, hyperlipemia, L achilles tendon surgery, L shoulder surgery (2010).    PT Comments    Pt continues very cooperative with noted improvement in activity tolerance and with no c/o dizziness but pt continues unsteady on feet and at increased risk of falling.   Pt up to ambulate increased distance in hall and negotiated stairs.  BP supine104/63; sit 105/78; stand 109/76.   Follow Up Recommendations  Follow surgeon's recommendation for DC plan and follow-up therapies;Outpatient PT     Equipment Recommendations  None recommended by PT    Recommendations for Other Services       Precautions / Restrictions Precautions Precautions: Fall Required Braces or Orthoses: Knee Immobilizer - Right Knee Immobilizer - Right: Discontinue once straight leg raise with < 10 degree lag Restrictions Weight Bearing Restrictions: No RLE Weight Bearing: Weight bearing as tolerated    Mobility  Bed Mobility               General bed mobility comments: Pt up in chair and requests back to same    Transfers Overall transfer level: Needs assistance Equipment used: Rolling walker (2 wheeled) Transfers: Sit to/from Stand Sit to Stand: Min assist;Min guard         General transfer comment: cues for LE management and use of UEs to self assist  Ambulation/Gait Ambulation/Gait assistance: Min guard Gait Distance (Feet): 150 Feet Assistive device: Rolling walker (2 wheeled) Gait Pattern/deviations: Decreased stride length;Decreased weight shift to right;Decreased stance time - right;Step-to pattern;Step-through pattern Gait velocity: decr   General Gait Details: Increased time, assist to steady and manage RW and cues for sequence,  posture and position from RW.  no c/o dizziness   Stairs Stairs: Yes Stairs assistance: Min assist Stair Management: One rail Right;Step to pattern;Forwards;With crutches Number of Stairs: 4 General stair comments: 2 steps twice with cues for sequence and foot/crutch placement.   Wheelchair Mobility    Modified Rankin (Stroke Patients Only)       Balance Overall balance assessment: Needs assistance Sitting-balance support: Feet supported Sitting balance-Leahy Scale: Good     Standing balance support: No upper extremity supported Standing balance-Leahy Scale: Fair                              Cognition Arousal/Alertness: Awake/alert Behavior During Therapy: WFL for tasks assessed/performed Overall Cognitive Status: Within Functional Limits for tasks assessed                                        Exercises      General Comments        Pertinent Vitals/Pain Pain Assessment: 0-10 Pain Score: 3  Pain Location: Rt knee Pain Descriptors / Indicators: Burning;Tightness;Sore Pain Intervention(s): Limited activity within patient's tolerance;Monitored during session;Premedicated before session;Ice applied    Home Living                      Prior Function            PT Goals (current goals can now be found in the care plan section) Acute Rehab PT Goals Patient Stated Goal:  Be able to move safer to go home PT Goal Formulation: With patient/family Time For Goal Achievement: 10/17/20 Potential to Achieve Goals: Good Progress towards PT goals: Progressing toward goals    Frequency    7X/week      PT Plan Current plan remains appropriate    Co-evaluation              AM-PAC PT "6 Clicks" Mobility   Outcome Measure  Help needed turning from your back to your side while in a flat bed without using bedrails?: A Little Help needed moving from lying on your back to sitting on the side of a flat bed without using  bedrails?: A Little Help needed moving to and from a bed to a chair (including a wheelchair)?: A Little Help needed standing up from a chair using your arms (e.g., wheelchair or bedside chair)?: A Little Help needed to walk in hospital room?: A Little Help needed climbing 3-5 steps with a railing? : A Little 6 Click Score: 18    End of Session Equipment Utilized During Treatment: Gait belt Activity Tolerance: Patient tolerated treatment well Patient left: in chair;with call bell/phone within reach;with family/visitor present Nurse Communication: Mobility status PT Visit Diagnosis: Unsteadiness on feet (R26.81);Muscle weakness (generalized) (M62.81);Difficulty in walking, not elsewhere classified (R26.2)     Time: 1413-1450 PT Time Calculation (min) (ACUTE ONLY): 37 min  Charges:  $Gait Training: 23-37 mins                     [place] Pager [phone] Office [phone]    Kayman Snuffer 10/04/2020, 2:58 PM

## 2020-10-04 NOTE — Progress Notes (Signed)
Physical Therapy Treatment Patient Details Name: Cynthia [name] MRN: [id number] DOB: 01-30-1966 Today's Date: 10/04/2020    History of Present Illness Pt is a 55 y.o. female s/p R patellofemoral arthroplasty on 10/03/20. PMH significant for depression, hypothyroidism, SBO, hyperlipemia, L achilles tendon surgery, L shoulder surgery (2010).    PT Comments    Pt very motivated and progressing with mobility but continues limited by mild buckling at R knee and c/o dizziness with ambulation.  Pt remains hopeful for possible dc later this date.   Follow Up Recommendations  Follow surgeon's recommendation for DC plan and follow-up therapies;Outpatient PT     Equipment Recommendations  None recommended by PT    Recommendations for Other Services       Precautions / Restrictions Precautions Precautions: Fall Required Braces or Orthoses: Knee Immobilizer - Right Knee Immobilizer - Right: Discontinue once straight leg raise with < 10 degree lag Restrictions Weight Bearing Restrictions: No RLE Weight Bearing: Weight bearing as tolerated    Mobility  Bed Mobility               General bed mobility comments: Pt up in chair and requests back to same    Transfers Overall transfer level: Needs assistance Equipment used: Rolling walker (2 wheeled) Transfers: Sit to/from Stand Sit to Stand: Min assist;Min guard         General transfer comment: cues for LE management and use of UEs to self assist  Ambulation/Gait Ambulation/Gait assistance: Min assist;Min guard Gait Distance (Feet): 55 Feet (and additional 20') Assistive device: Rolling walker (2 wheeled) Gait Pattern/deviations: Step-to pattern;Decreased stride length;Decreased weight shift to right;Decreased stance time - right Gait velocity: decr   General Gait Details: cues for sequence, posture and position from RW.  Distance ltd by fatigue and c/o mild dizziness - BP 107/63   Stairs             Wheelchair  Mobility    Modified Rankin (Stroke Patients Only)       Balance Overall balance assessment: Needs assistance Sitting-balance support: Feet supported Sitting balance-Leahy Scale: Good     Standing balance support: Bilateral upper extremity supported;During functional activity Standing balance-Leahy Scale: Poor Standing balance comment: use of external support                            Cognition Arousal/Alertness: Awake/alert Behavior During Therapy: WFL for tasks assessed/performed Overall Cognitive Status: Within Functional Limits for tasks assessed                                        Exercises Total Joint Exercises Ankle Circles/Pumps: AROM;Both;20 reps;Supine Quad Sets: AROM;Both;10 reps;Supine Heel Slides: AAROM;Right;15 reps;Supine Straight Leg Raises: AAROM;Right;10 reps;Supine Goniometric ROM: -5 - 95    General Comments        Pertinent Vitals/Pain Pain Assessment: 0-10 Pain Score: 4  Pain Location: Rt knee Pain Descriptors / Indicators: Burning;Tightness;Sore Pain Intervention(s): Limited activity within patient's tolerance;Monitored during session;Premedicated before session;Ice applied    Home Living                      Prior Function            PT Goals (current goals can now be found in the care plan section) Acute Rehab PT Goals Patient Stated Goal: Be able to move safer  to go home PT Goal Formulation: With patient/family Time For Goal Achievement: 10/17/20 Potential to Achieve Goals: Good Progress towards PT goals: Progressing toward goals    Frequency    7X/week      PT Plan Current plan remains appropriate    Co-evaluation              AM-PAC PT "6 Clicks" Mobility   Outcome Measure  Help needed turning from your back to your side while in a flat bed without using bedrails?: A Little Help needed moving from lying on your back to sitting on the side of a flat bed without using  bedrails?: A Little Help needed moving to and from a bed to a chair (including a wheelchair)?: A Little Help needed standing up from a chair using your arms (e.g., wheelchair or bedside chair)?: A Little Help needed to walk in hospital room?: A Little Help needed climbing 3-5 steps with a railing? : A Lot 6 Click Score: 17    End of Session Equipment Utilized During Treatment: Gait belt Activity Tolerance: Patient tolerated treatment well;Patient limited by fatigue Patient left: in chair;with call bell/phone within reach;with family/visitor present Nurse Communication: Mobility status PT Visit Diagnosis: Unsteadiness on feet (R26.81);Muscle weakness (generalized) (M62.81);Difficulty in walking, not elsewhere classified (R26.2)     Time: [id number] PT Time Calculation (min) (ACUTE ONLY): 33 min  Charges:  $Gait Training: 8-22 mins $Therapeutic Exercise: 8-22 mins                     [place] Pager [phone] Office [phone]    Adreonna Yontz 10/04/2020, 1:40 PM

## 2020-10-04 NOTE — Plan of Care (Signed)

## 2020-10-05 DIAGNOSIS — M1711 Unilateral primary osteoarthritis, right knee: Secondary | ICD-10-CM | POA: Diagnosis not present

## 2020-10-05 NOTE — Progress Notes (Signed)
Physical Therapy Treatment Patient Details Name: Cynthia [name] MRN: [id number] DOB: May 29, 1965 Today's Date: 10/05/2020    History of Present Illness Pt is a 55 y.o. female s/p R patellofemoral arthroplasty on 10/03/20. PMH significant for depression, hypothyroidism, SBO, hyperlipemia, L achilles tendon surgery, L shoulder surgery (2010).    PT Comments    Pt performed HEP with assist and with written instruction provided.  Pt allowed short break prior to attempt to mobilize.   Follow Up Recommendations  Follow surgeon's recommendation for DC plan and follow-up therapies;Outpatient PT     Equipment Recommendations  None recommended by PT    Recommendations for Other Services       Precautions / Restrictions Precautions Precautions: Fall Required Braces or Orthoses: Knee Immobilizer - Right Knee Immobilizer - Right: Discontinue once straight leg raise with < 10 degree lag Restrictions Weight Bearing Restrictions: No RLE Weight Bearing: Weight bearing as tolerated    Mobility  Bed Mobility               General bed mobility comments: Pt up in chair    Transfers                    Ambulation/Gait                 Stairs             Wheelchair Mobility    Modified Rankin (Stroke Patients Only)       Balance                                            Cognition Arousal/Alertness: Awake/alert Behavior During Therapy: WFL for tasks assessed/performed Overall Cognitive Status: Within Functional Limits for tasks assessed                                        Exercises Total Joint Exercises Ankle Circles/Pumps: AROM;Both;20 reps;Supine Quad Sets: AROM;Both;10 reps;Supine Heel Slides: AAROM;Right;15 reps;Supine Hip ABduction/ADduction: AAROM;Right;15 reps;Supine Straight Leg Raises: AAROM;Right;Supine;20 reps Long Arc Quad: AAROM;Right;10 reps;Seated Knee Flexion: AAROM;Right;10  reps;Seated Goniometric ROM: -5 - 65 pain limited    General Comments        Pertinent Vitals/Pain Pain Assessment: 0-10 Pain Score: 5  Pain Location: Rt knee Pain Descriptors / Indicators: Burning;Tightness;Sore Pain Intervention(s): Limited activity within patient's tolerance;Monitored during session;Premedicated before session    Home Living                      Prior Function            PT Goals (current goals can now be found in the care plan section) Acute Rehab PT Goals Patient Stated Goal: Be able to move safer to go home PT Goal Formulation: With patient/family Time For Goal Achievement: 10/17/20 Potential to Achieve Goals: Good Progress towards PT goals: Progressing toward goals    Frequency    7X/week      PT Plan Current plan remains appropriate    Co-evaluation              AM-PAC PT "6 Clicks" Mobility   Outcome Measure  Help needed turning from your back to your side while in a flat bed without using bedrails?: A Little Help needed moving from lying  on your back to sitting on the side of a flat bed without using bedrails?: A Little Help needed moving to and from a bed to a chair (including a wheelchair)?: A Little Help needed standing up from a chair using your arms (e.g., wheelchair or bedside chair)?: A Little Help needed to walk in hospital room?: A Little Help needed climbing 3-5 steps with a railing? : A Little 6 Click Score: 18    End of Session Equipment Utilized During Treatment: Gait belt Activity Tolerance: Patient tolerated treatment well;Patient limited by fatigue Patient left: in chair;with call bell/phone within reach;with family/visitor present Nurse Communication: Mobility status PT Visit Diagnosis: Unsteadiness on feet (R26.81);Muscle weakness (generalized) (M62.81);Difficulty in walking, not elsewhere classified (R26.2)     Time: [id number] PT Time Calculation (min) (ACUTE ONLY): 32 min  Charges:   $Therapeutic Exercise: 23-37 mins                     [place] Pager [phone] Office [phone]    Cynthia [name] 10/05/2020, 10:29 AM

## 2020-10-05 NOTE — Progress Notes (Signed)
Subjective: 2 Days Post-Op Procedure(s) (LRB): PATELLA-FEMORAL ARTHROPLASTY (Right) Patient reports pain as mild.   Patient doing better. She did report some dizziness yesterday with ambulation but that resolved by pm session. Still some N/T in right lower extremity but improving Patient still reports some difficulty with ambulation and steps but was able to ambulate with assistance    Objective: Vital signs in last 24 hours: Temp:  [97.7 F (36.5 C)-98.2 F (36.8 C)] 98 F (36.7 C) (09/02 0434) Pulse Rate:  [79-96] 87 (09/02 0434) Resp:  [16-17] 16 (09/02 0434) BP: (90-125)/(46-77) 103/68 (09/02 0434) SpO2:  [95 %-98 %] 95 % (09/02 0434)  Intake/Output from previous day: 09/01 0701 - 09/02 0700 In: 2066.6 [P.O.:1260; I.V.:656.6; IV Piggyback:150] Out: -  Intake/Output this shift: No intake/output data recorded.  Recent Labs    10/03/20 1004 10/03/20 1721  HGB 12.9 8.9*   Recent Labs    10/03/20 1004 10/03/20 1721  WBC 9.6 12.6*  RBC 4.29 3.03*  HCT 40.2 26.2*  PLT 237 228   Recent Labs    10/03/20 1004 10/03/20 1721  NA 139  --   K 4.4  --   CL 107  --   CO2 25  --   BUN 13  --   CREATININE 1.01* 0.76  GLUCOSE 112*  --   CALCIUM 8.9  --    No results for input(s): LABPT, INR in the last 72 hours.  Neurologically intact Neurovascular intact Sensation intact distally Intact pulses distally Dorsiflexion/Plantar flexion intact Incision: dressing C/D/I Compartment soft   Assessment/Plan: 2 Days Post-Op Procedure(s) (LRB): PATELLA-FEMORAL ARTHROPLASTY (Right) Up with therapy, WBAT Hopeful d/c today but depending on PT and if she is able to pass Rx already sent to pharmacy DVT ppx: Lovenox in house, ASA 81 mg BID starting morning after she gets home Has OPPT set up for next week    Patient's anticipated LOS is less than 2 midnights, meeting these requirements: - Younger than 49 - Lives within 1 hour of care - Has a competent adult at home to  recover with post-op recover - NO history of  - Chronic pain requiring opiods  - Diabetes  - Coronary Artery Disease  - Heart failure  - Heart attack  - Stroke  - DVT/VTE  - Cardiac arrhythmia  - Respiratory Failure/COPD  - Renal failure  - Anemia  - Advanced Liver disease     Sharron Petruska R Abrial Arrighi 10/05/2020, 7:22 AM

## 2020-10-05 NOTE — Progress Notes (Signed)
Provided discharge education/instructions, all questions and concerns addressed, Pt not in acute distress, dressing to RLE changed, PA aware. Pt discharged home with belongings accompanied by husband.

## 2020-10-05 NOTE — Progress Notes (Signed)
Physical Therapy Treatment Patient Details Name: Cynthia [name] MRN: [id number] DOB: 04-10-1965 Today's Date: 10/05/2020    History of Present Illness Pt is a 55 y.o. female s/p R patellofemoral arthroplasty on 10/03/20. PMH significant for depression, hypothyroidism, SBO, hyperlipemia, L achilles tendon surgery, L shoulder surgery (2010).    PT Comments    Pt very cooperative and continues steady progress with mobility.  Pt up to ambulate in hall and negotiated stairs with assist of spouse.  Pt reports fatigue but no c/o dizziness throughout session.  BP supine 93/55. Sitting 90/57, standing 93/61 and after ambulating 96/64 - RN aware.  Follow Up Recommendations  Follow surgeon's recommendation for DC plan and follow-up therapies;Outpatient PT     Equipment Recommendations  None recommended by PT    Recommendations for Other Services       Precautions / Restrictions Precautions Precautions: Fall Required Braces or Orthoses: Knee Immobilizer - Right Knee Immobilizer - Right: Discontinue once straight leg raise with < 10 degree lag Restrictions Weight Bearing Restrictions: No RLE Weight Bearing: Weight bearing as tolerated    Mobility  Bed Mobility               General bed mobility comments: Pt up in chair and requests back to same    Transfers Overall transfer level: Needs assistance Equipment used: Rolling walker (2 wheeled) Transfers: Sit to/from Stand Sit to Stand: Min guard;Supervision         General transfer comment: cues for LE management and use of UEs to self assist  Ambulation/Gait Ambulation/Gait assistance: Min guard;Supervision Gait Distance (Feet): 125 Feet Assistive device: Rolling walker (2 wheeled) Gait Pattern/deviations: Decreased stride length;Decreased weight shift to right;Decreased stance time - right;Step-to pattern;Step-through pattern Gait velocity: decr   General Gait Details: Increased time with min cues for posture, position from  RW and initial sequence   Stairs Stairs: Yes Stairs assistance: Min assist Stair Management: One rail Right;Step to pattern;Forwards;With crutches Number of Stairs: 3 General stair comments: cues for sequence and foot/crutch placement - spouse assisting   Wheelchair Mobility    Modified Rankin (Stroke Patients Only)       Balance Overall balance assessment: Needs assistance Sitting-balance support: Feet supported Sitting balance-Leahy Scale: Good     Standing balance support: No upper extremity supported Standing balance-Leahy Scale: Fair                              Cognition Arousal/Alertness: Awake/alert Behavior During Therapy: WFL for tasks assessed/performed Overall Cognitive Status: Within Functional Limits for tasks assessed                                        Exercises      General Comments        Pertinent Vitals/Pain Pain Assessment: 0-10 Pain Score: 5  Pain Location: Rt knee Pain Descriptors / Indicators: Burning;Tightness;Sore Pain Intervention(s): Limited activity within patient's tolerance;Monitored during session;Premedicated before session;Ice applied    Home Living                      Prior Function            PT Goals (current goals can now be found in the care plan section) Acute Rehab PT Goals Patient Stated Goal: Regain IND PT Goal Formulation: With patient/family Time For Goal  Achievement: 10/17/20 Potential to Achieve Goals: Good Progress towards PT goals: Progressing toward goals    Frequency    7X/week      PT Plan Current plan remains appropriate    Co-evaluation              AM-PAC PT "6 Clicks" Mobility   Outcome Measure  Help needed turning from your back to your side while in a flat bed without using bedrails?: A Little Help needed moving from lying on your back to sitting on the side of a flat bed without using bedrails?: A Little Help needed moving to and  from a bed to a chair (including a wheelchair)?: A Little Help needed standing up from a chair using your arms (e.g., wheelchair or bedside chair)?: A Little Help needed to walk in hospital room?: A Little Help needed climbing 3-5 steps with a railing? : A Little 6 Click Score: 18    End of Session Equipment Utilized During Treatment: Gait belt Activity Tolerance: Patient tolerated treatment well;Patient limited by fatigue Patient left: in chair;with call bell/phone within reach;with family/visitor present Nurse Communication: Mobility status PT Visit Diagnosis: Unsteadiness on feet (R26.81);Muscle weakness (generalized) (M62.81);Difficulty in walking, not elsewhere classified (R26.2)     Time: [id number] PT Time Calculation (min) (ACUTE ONLY): 32 min  Charges:  $Gait Training: 23-37 mins                     [place] Pager [phone] Office [phone]    Jarmon Javid 10/05/2020, 3:35 PM

## 2020-10-05 NOTE — Plan of Care (Signed)

## 2020-10-18 NOTE — Discharge Summary (Signed)
Physician Discharge Summary  Patient ID: Cynthia [name] MRN: [id number] DOB/AGE: Oct 03, 1965 55 y.o.  Admit date: 10/03/2020 Discharge date: 10/18/2020  Admission Diagnoses: Right knee patellofemoral osteoarthritis  Discharge Diagnoses:  Active Problems:   Osteoarthritis of right knee   Discharged Condition: good  Hospital Course: Patient was admitted on October 03, 2020 for right patellofemoral end-stage osteoarthritis.  She was admitted for a right patellofemoral joint replacement.  Patient did well during surgery, no complications.  Patient was sent to PACU in stable condition.  Patient unfortunately has some difficulty with ambulation in PACU so she was admitted overnight.  No events overnight.  Postop day 1 patient was having some difficulty with pain control as well as physical therapy.  She was having difficulty with ambulation.  Patient continued to improve with physical therapy.  No events overnight.  Postop day 2 patient did much better with physical therapy.  She was able to get up and ambulate with rolling walker.  Patient was able to be discharged this day.  She was sent home with oxycodone, tramadol, methocarbamol, Zofran.  She will follow-up in the office in 2 weeks.  Consults: None  Significant Diagnostic Studies: None  Treatments: IV hydration, antibiotics: Ancef, analgesia: acetaminophen w/ codeine, Dilaudid, and tramadol, anticoagulation: Lovenox, therapies: PT, and surgery: Right patellofemoral arthroplasty  Discharge Exam: Blood pressure (!) 99/51, pulse 92, temperature 98.4 F (36.9 C), temperature source Axillary, resp. rate 20, height '5\' 7"'$  (1.702 m), weight 102.1 kg, SpO2 98 %. General appearance: alert, cooperative, appears stated age, and no distress Extremities: extremities normal, atraumatic, no cyanosis or edema and Homans sign is negative, no sign of DVT Pulses: Right Pulses: FEM: present 2+, POP: present 2+, DP: present 2+, PT: present 2+ Skin: Skin color,  texture, turgor normal. No rashes or lesions Incision/Wound:  dressings clean dry and intact  Disposition: Discharge disposition: 01-Home or Self Care       Discharge Instructions     Call MD / Call 911   Complete by: As directed    If you experience chest pain or shortness of breath, CALL 911 and be transported to the hospital emergency room.  If you develope a fever above 101 F, pus (white drainage) or increased drainage or redness at the wound, or calf pain, call your surgeon's office.   Call MD / Call 911   Complete by: As directed    If you experience chest pain or shortness of breath, CALL 911 and be transported to the hospital emergency room.  If you develope a fever above 101 F, pus (white drainage) or increased drainage or redness at the wound, or calf pain, call your surgeon's office.   Constipation Prevention   Complete by: As directed    Drink plenty of fluids.  Prune juice may be helpful.  You may use a stool softener, such as Colace (over the counter) 100 mg twice a day.  Use MiraLax (over the counter) for constipation as needed.   Constipation Prevention   Complete by: As directed    Drink plenty of fluids.  Prune juice may be helpful.  You may use a stool softener, such as Colace (over the counter) 100 mg twice a day.  Use MiraLax (over the counter) for constipation as needed.   Diet - low sodium heart healthy   Complete by: As directed    Diet - low sodium heart healthy   Complete by: As directed    Discharge instructions   Complete by: As directed  Dr. Sydnee Cabal [facility] [address], [place], [place] [address] [phone]  Patella femoral KNEE REPLACEMENT POSTOPERATIVE DIRECTIONS  Knee Rehabilitation, Guidelines Following Surgery  Results after knee surgery are often greatly improved when you follow the exercise, range of motion and muscle strengthening exercises prescribed by your doctor. Safety measures are also important to protect  the knee from further injury. Any time any of these exercises cause you to have increased pain or swelling in your knee joint, decrease the amount until you are comfortable again and slowly increase them. If you have problems or questions, call your caregiver or physical therapist for advice.   HOME CARE INSTRUCTIONS  Remove items at home which could result in a fall. This includes throw rugs or furniture in walking pathways.  ICE to the affected knee every three hours for 30 minutes at a time and then as needed for pain and swelling.  Continue to use ice on the knee for pain and swelling from surgery. You may notice swelling that will progress down to the foot and ankle.  This is normal after surgery.  Elevate the leg when you are not up walking on it.   Continue to use the breathing machine which will help keep your temperature down.  It is common for your temperature to cycle up and down following surgery, especially at night when you are not up moving around and exerting yourself.  The breathing machine keeps your lungs expanded and your temperature down. Do not place pillow under knee, focus on keeping the knee straight while resting   DIET You may resume your previous home diet once your are discharged from the hospital.  DRESSING / WOUND CARE / SHOWERING Keep the surgical dressing until follow up.  The dressing is water proof, but you need to put extra covering over it like plastic wrap.  IF THE DRESSING FALLS OFF or the wound gets wet inside, change the dressing with sterile gauze.  Please use good hand washing techniques before changing the dressing.  Do not use any lotions or creams on the incision until instructed by your surgeon.   You may start showering once you are discharged home but do not submerge the incision under water.  You are sent home with an ACE bandage on over the leg, this can be removed at 3 days from surgery. At this time you can start showering. Please place the white TED  stocking on the surgical leg after. This needs to be worn on the surgical leg for 2 weeks after surgery.   ACTIVITY Walk with your walker as instructed. Use walker as long as suggested by your caregivers. Avoid periods of inactivity such as sitting longer than an hour when not asleep. This helps prevent blood clots.  You may resume a sexual relationship in one month or when given the OK by your doctor.  You may return to work once you are cleared by your doctor.  Do not drive a car for 6 weeks or until released by you surgeon.  Do not drive while taking narcotics.  WEIGHT BEARING Weight bearing as tolerated with assist device (walker, cane, etc) as directed, use it as long as suggested by your surgeon or therapist, typically at least 4-6 weeks.  POSTOPERATIVE CONSTIPATION PROTOCOL Constipation - defined medically as fewer than three stools per week and severe constipation as less than one stool per week.  One of the most common issues patients have following surgery is constipation.  Even  if you have a regular bowel pattern at home, your normal regimen is likely to be disrupted due to multiple reasons following surgery.  Combination of anesthesia, postoperative narcotics, change in appetite and fluid intake all can affect your bowels.  In order to avoid complications following surgery, here are some recommendations in order to help you during your recovery period.  Colace (docusate) - Pick up an over-the-counter form of Colace or another stool softener and take twice a day as long as you are requiring postoperative pain medications.  Take with a full glass of water daily.  If you experience loose stools or diarrhea, hold the colace until you stool forms back up.  If your symptoms do not get better within 1 week or if they get worse, check with your doctor.  Dulcolax (bisacodyl) - Pick up over-the-counter and take as directed by the product packaging as needed to assist with the movement of your  bowels.  Take with a full glass of water.  Use this product as needed if not relieved by Colace only.   MiraLax (polyethylene glycol) - Pick up over-the-counter to have on hand.  MiraLax is a solution that will increase the amount of water in your bowels to assist with bowel movements.  Take as directed and can mix with a glass of water, juice, soda, coffee, or tea.  Take if you go more than two days without a movement. Do not use MiraLax more than once per day. Call your doctor if you are still constipated or irregular after using this medication for 7 days in a row.  If you continue to have problems with postoperative constipation, please contact the office for further assistance and recommendations.  If you experience "the worst abdominal pain ever" or develop nausea or vomiting, please contact the office immediatly for further recommendations for treatment.  ITCHING  If you experience itching with your medications, try taking only a single pain pill, or even half a pain pill at a time.  You can also use Benadryl over the counter for itching or also to help with sleep.   TED HOSE STOCKINGS Wear the elastic stockings on both legs for two weeks following surgery during the day but you may remove then at night for sleeping.  Okay to remove ACE in 3 days, put TED on after  MEDICATIONS See your medication summary on the "After Visit Summary" that the nursing staff will review with you prior to discharge.  You may have some home medications which will be placed on hold until you complete the course of blood thinner medication.  It is important for you to complete the blood thinner medication as prescribed by your surgeon.  Continue your approved medications as instructed at time of discharge. If you were not previously taking any blood thinners prior to surgery please start taking Aspirin 325 mg tabs twice daily for 6 weeks. If you are unable to take Aspirin please let your doctor know.    PRECAUTIONS If you experience chest pain or shortness of breath - call 911 immediately for transfer to the hospital emergency department.  If you develop a fever greater that 101 F, purulent drainage from wound, increased redness or drainage from wound, foul odor from the wound/dressing, or calf pain - CONTACT YOUR SURGEON.  FOLLOW-UP APPOINTMENTS Make sure you keep all of your appointments after your operation with your surgeon and caregivers. You should call the office at the above phone number and make an appointment for approximately two weeks after the date of your surgery or on the date instructed by your surgeon outlined in the "After Visit Summary".   RANGE OF MOTION AND STRENGTHENING EXERCISES  Rehabilitation of the knee is important following a knee injury or an operation. After just a few days of immobilization, the muscles of the thigh which control the knee become weakened and shrink (atrophy). Knee exercises are designed to build up the tone and strength of the thigh muscles and to improve knee motion. Often times heat used for twenty to thirty minutes before working out will loosen up your tissues and help with improving the range of motion but do not use heat for the first two weeks following surgery. These exercises can be done on a training (exercise) mat, on the floor, on a table or on a bed. Use what ever works the best and is most comfortable for you Knee exercises include:  Leg Lifts - While your knee is still immobilized in a splint or cast, you can do straight leg raises. Lift the leg to 60 degrees, hold for 3 sec, and slowly lower the leg. Repeat 10-20 times 2-3 times daily. Perform this exercise against resistance later as your knee gets better.  Quad and Hamstring Sets - Tighten up the muscle on the front of the thigh (Quad) and hold for 5-10 sec. Repeat this 10-20 times hourly. Hamstring sets are done by pushing the foot  backward against an object and holding for 5-10 sec. Repeat as with quad sets.  Leg Slides: Lying on your back, slowly slide your foot toward your buttocks, bending your knee up off the floor (only go as far as is comfortable). Then slowly slide your foot back down until your leg is flat on the floor again. Angel Wings: Lying on your back spread your legs to the side as far apart as you can without causing discomfort.  A rehabilitation program following serious knee injuries can speed recovery and prevent re-injury in the future due to weakened muscles. Contact your doctor or a physical therapist for more information on knee rehabilitation.   IF YOU ARE TRANSFERRED TO A SKILLED REHAB FACILITY If the patient is transferred to a skilled rehab facility following release from the hospital, a list of the current medications will be sent to the facility for the patient to continue.  When discharged from the skilled rehab facility, please have the facility set up the patient's [place] prior to being released. Also, the skilled facility will be responsible for providing the patient with their medications at time of release from the facility to include their pain medication, the muscle relaxants, and their blood thinner medication. If the patient is still at the rehab facility at time of the two week follow up appointment, the skilled rehab facility will also need to assist the patient in arranging follow up appointment in our office and any transportation needs.  MAKE SURE YOU:  Understand these instructions.  Get help right away if you are not doing well or get worse.    Pick up stool softner and laxative for home use following surgery while on pain medications. May shower starting three days after surgery. Please use a clean towel to pat the leg dry following showers. Continue to use ice for pain  and swelling after surgery. Do not use any lotions or creams on the incision until  instructed by you Start Aspirin immediately following surgery.   Discharge instructions   Complete by: As directed    Dr. Sydnee Cabal [facility] [address] [place], [place] [address] [phone]  TOTAL KNEE REPLACEMENT POSTOPERATIVE DIRECTIONS  Knee Rehabilitation, Guidelines Following Surgery  Results after knee surgery are often greatly improved when you follow the exercise, range of motion and muscle strengthening exercises prescribed by your doctor. Safety measures are also important to protect the knee from further injury. Any time any of these exercises cause you to have increased pain or swelling in your knee joint, decrease the amount until you are comfortable again and slowly increase them. If you have problems or questions, call your caregiver or physical therapist for advice.   HOME CARE INSTRUCTIONS  Remove items at home which could result in a fall. This includes throw rugs or furniture in walking pathways.  ICE to the affected knee every three hours for 30 minutes at a time and then as needed for pain and swelling.  Continue to use ice on the knee for pain and swelling from surgery. You may notice swelling that will progress down to the foot and ankle.  This is normal after surgery.  Elevate the leg when you are not up walking on it.   Continue to use the breathing machine which will help keep your temperature down.  It is common for your temperature to cycle up and down following surgery, especially at night when you are not up moving around and exerting yourself.  The breathing machine keeps your lungs expanded and your temperature down. Do not place pillow under knee, focus on keeping the knee straight while resting   DIET You may resume your previous home diet once your are discharged from the hospital.  DRESSING / WOUND CARE / SHOWERING Keep the surgical dressing until follow up.  The dressing is water proof, but you need to put extra covering over it  like plastic wrap.  IF THE DRESSING FALLS OFF or the wound gets wet inside, change the dressing with sterile gauze.  Please use good hand washing techniques before changing the dressing.  Do not use any lotions or creams on the incision until instructed by your surgeon.   You may start showering once you are discharged home but do not submerge the incision under water.  You are sent home with an ACE bandage on over the leg, this can be removed at 3 days from surgery. At this time you can start showering. Please place the white TED stocking on the surgical leg after. This needs to be worn on the surgical leg for 2 weeks after surgery.   ACTIVITY Walk with your walker as instructed. Use walker as long as suggested by your caregivers. Avoid periods of inactivity such as sitting longer than an hour when not asleep. This helps prevent blood clots.  You may resume a sexual relationship in one month or when given the OK by your doctor.  You may return to work once you are cleared by your doctor.  Do not drive a car for 6 weeks or until released by you surgeon.  Do not drive while taking narcotics.  WEIGHT BEARING Weight bearing as tolerated with assist device (walker, cane, etc) as directed, use it as long as suggested by your surgeon or therapist, typically at least 4-6 weeks.  POSTOPERATIVE CONSTIPATION PROTOCOL Constipation -  defined medically as fewer than three stools per week and severe constipation as less than one stool per week.  One of the most common issues patients have following surgery is constipation.  Even if you have a regular bowel pattern at home, your normal regimen is likely to be disrupted due to multiple reasons following surgery.  Combination of anesthesia, postoperative narcotics, change in appetite and fluid intake all can affect your bowels.  In order to avoid complications following surgery, here are some recommendations in order to help you during your recovery  period.  Colace (docusate) - Pick up an over-the-counter form of Colace or another stool softener and take twice a day as long as you are requiring postoperative pain medications.  Take with a full glass of water daily.  If you experience loose stools or diarrhea, hold the colace until you stool forms back up.  If your symptoms do not get better within 1 week or if they get worse, check with your doctor.  Dulcolax (bisacodyl) - Pick up over-the-counter and take as directed by the product packaging as needed to assist with the movement of your bowels.  Take with a full glass of water.  Use this product as needed if not relieved by Colace only.   MiraLax (polyethylene glycol) - Pick up over-the-counter to have on hand.  MiraLax is a solution that will increase the amount of water in your bowels to assist with bowel movements.  Take as directed and can mix with a glass of water, juice, soda, coffee, or tea.  Take if you go more than two days without a movement. Do not use MiraLax more than once per day. Call your doctor if you are still constipated or irregular after using this medication for 7 days in a row.  If you continue to have problems with postoperative constipation, please contact the office for further assistance and recommendations.  If you experience "the worst abdominal pain ever" or develop nausea or vomiting, please contact the office immediatly for further recommendations for treatment.  ITCHING  If you experience itching with your medications, try taking only a single pain pill, or even half a pain pill at a time.  You can also use Benadryl over the counter for itching or also to help with sleep.   TED HOSE STOCKINGS Wear the elastic stockings on both legs for two weeks following surgery during the day but you may remove then at night for sleeping.  Okay to remove ACE in 3 days, put TED on after  MEDICATIONS See your medication summary on the "After Visit Summary" that the nursing  staff will review with you prior to discharge.  You may have some home medications which will be placed on hold until you complete the course of blood thinner medication.  It is important for you to complete the blood thinner medication as prescribed by your surgeon.  Continue your approved medications as instructed at time of discharge. If you were not previously taking any blood thinners prior to surgery please start taking Aspirin 325 mg tabs twice daily for 6 weeks. If you are unable to take Aspirin please let your doctor know.   PRECAUTIONS If you experience chest pain or shortness of breath - call 911 immediately for transfer to the hospital emergency department.  If you develop a fever greater that 101 F, purulent drainage from wound, increased redness or drainage from wound, foul odor from the wound/dressing, or calf pain - CONTACT YOUR SURGEON.  FOLLOW-UP APPOINTMENTS Make sure you keep all of your appointments after your operation with your surgeon and caregivers. You should call the office at the above phone number and make an appointment for approximately two weeks after the date of your surgery or on the date instructed by your surgeon outlined in the "After Visit Summary".   RANGE OF MOTION AND STRENGTHENING EXERCISES  Rehabilitation of the knee is important following a knee injury or an operation. After just a few days of immobilization, the muscles of the thigh which control the knee become weakened and shrink (atrophy). Knee exercises are designed to build up the tone and strength of the thigh muscles and to improve knee motion. Often times heat used for twenty to thirty minutes before working out will loosen up your tissues and help with improving the range of motion but do not use heat for the first two weeks following surgery. These exercises can be done on a training (exercise) mat, on the floor, on a table or on a bed. Use what ever  works the best and is most comfortable for you Knee exercises include:  Leg Lifts - While your knee is still immobilized in a splint or cast, you can do straight leg raises. Lift the leg to 60 degrees, hold for 3 sec, and slowly lower the leg. Repeat 10-20 times 2-3 times daily. Perform this exercise against resistance later as your knee gets better.  Quad and Hamstring Sets - Tighten up the muscle on the front of the thigh (Quad) and hold for 5-10 sec. Repeat this 10-20 times hourly. Hamstring sets are done by pushing the foot backward against an object and holding for 5-10 sec. Repeat as with quad sets.  Leg Slides: Lying on your back, slowly slide your foot toward your buttocks, bending your knee up off the floor (only go as far as is comfortable). Then slowly slide your foot back down until your leg is flat on the floor again. Angel Wings: Lying on your back spread your legs to the side as far apart as you can without causing discomfort.  A rehabilitation program following serious knee injuries can speed recovery and prevent re-injury in the future due to weakened muscles. Contact your doctor or a physical therapist for more information on knee rehabilitation.   IF YOU ARE TRANSFERRED TO A SKILLED REHAB FACILITY If the patient is transferred to a skilled rehab facility following release from the hospital, a list of the current medications will be sent to the facility for the patient to continue.  When discharged from the skilled rehab facility, please have the facility set up the patient's [place] prior to being released. Also, the skilled facility will be responsible for providing the patient with their medications at time of release from the facility to include their pain medication, the muscle relaxants, and their blood thinner medication. If the patient is still at the rehab facility at time of the two week follow up appointment, the skilled rehab facility will also need to  assist the patient in arranging follow up appointment in our office and any transportation needs.  MAKE SURE YOU:  Understand these instructions.  Get help right away if you are not doing well or get worse.    Pick up stool softner and laxative for home use following surgery while on pain medications. May shower starting three days after surgery. Please use a clean towel to pat the leg dry following showers. Continue to use ice for  pain and swelling after surgery. Do not use any lotions or creams on the incision until instructed by you Start Aspirin immediately following surgery.   Do not put a pillow under the knee. Place it under the heel.   Complete by: As directed    Do not put a pillow under the knee. Place it under the heel.   Complete by: As directed    Driving restrictions   Complete by: As directed    No driving for two weeks   Driving restrictions   Complete by: As directed    No driving for two weeks   Post-operative opioid taper instructions:   Complete by: As directed    POST-OPERATIVE OPIOID TAPER INSTRUCTIONS: It is important to wean off of your opioid medication as soon as possible. If you do not need pain medication after your surgery it is ok to stop day one. Opioids include: Codeine, Hydrocodone(Norco, Vicodin), Oxycodone(Percocet, oxycontin) and hydromorphone amongst others.  Long term and even short term use of opiods can cause: Increased pain response Dependence Constipation Depression Respiratory depression And more.  Withdrawal symptoms can include Flu like symptoms Nausea, vomiting And more Techniques to manage these symptoms Hydrate well Eat regular healthy meals Stay active Use relaxation techniques(deep breathing, meditating, yoga) Do Not substitute Alcohol to help with tapering If you have been on opioids for less than two weeks and do not have pain than it is ok to stop all together.  Plan to wean off of opioids This plan should start  within one week post op of your joint replacement. Maintain the same interval or time between taking each dose and first decrease the dose.  Cut the total daily intake of opioids by one tablet each day Next start to increase the time between doses. The last dose that should be eliminated is the evening dose.      Post-operative opioid taper instructions:   Complete by: As directed    POST-OPERATIVE OPIOID TAPER INSTRUCTIONS: It is important to wean off of your opioid medication as soon as possible. If you do not need pain medication after your surgery it is ok to stop day one. Opioids include: Codeine, Hydrocodone(Norco, Vicodin), Oxycodone(Percocet, oxycontin) and hydromorphone amongst others.  Long term and even short term use of opiods can cause: Increased pain response Dependence Constipation Depression Respiratory depression And more.  Withdrawal symptoms can include Flu like symptoms Nausea, vomiting And more Techniques to manage these symptoms Hydrate well Eat regular healthy meals Stay active Use relaxation techniques(deep breathing, meditating, yoga) Do Not substitute Alcohol to help with tapering If you have been on opioids for less than two weeks and do not have pain than it is ok to stop all together.  Plan to wean off of opioids This plan should start within one week post op of your joint replacement. Maintain the same interval or time between taking each dose and first decrease the dose.  Cut the total daily intake of opioids by one tablet each day Next start to increase the time between doses. The last dose that should be eliminated is the evening dose.      TED hose   Complete by: As directed    Use stockings (TED hose) for three weeks on both leg(s).  You may remove them at night for sleeping.   TED hose   Complete by: As directed    Use stockings (TED hose) for three weeks on both leg(s).  You may remove them at night for  sleeping.   Weight bearing as  tolerated   Complete by: As directed    Weight bearing as tolerated   Complete by: As directed       Allergies as of 10/05/2020       Reactions   Amoxicillin Other (See Comments)   Face, eyes, chest turned red.  Has tolerated Ancef.    Ampicillin Other (See Comments)   SERUM SICKNESS REACTION Has patient had a PCN reaction causing immediate rash, facial/tongue/throat swelling, SOB or lightheadedness with hypotension: Yes Has patient had a PCN reaction causing severe rash involving mucus membranes or skin necrosis: No Has patient had a PCN reaction that required hospitalization: No Has patient had a PCN reaction occurring within the last 10 years: No If all of the above answers are "NO", then may proceed with Cephalosporin use..  TOLERATED ANCEF.   Apple Fruit Extract Itching   Mouth itches inside, but no breathing impairment; treats with Benadryl   Cherry Extract Itching   Mouth itches inside, but no breathing impairment; treats with Benadryl   Fruit & Vegetable Daily [nutritional Supplements] Itching   FRUITS - Mouth itches inside, but no breathing impairment; treats with Benadryl   Imitrex [sumatriptan] Other (See Comments)   Chest Pain   Other Itching   Feline dander = Mouth itches inside, but no breathing impairment; treats with Benadryl   Peach [prunus Persica] Itching   Mouth itches inside, but no breathing impairment; treats with Benadryl   Strawberry Extract Itching   Mouth itches inside, but no breathing impairment; treats with Benadryl   Zoloft [sertraline Hcl] Other (See Comments)   Causes hyperactivity        Medication List     TAKE these medications    acetaminophen 500 MG tablet Commonly known as: TYLENOL Take 1,000 mg by mouth daily as needed for moderate pain or headache. Notes to patient: Last dose given 09/01 01:16pm   Aimovig 70 MG/ML Soaj Generic drug: Erenumab-aooe Inject 70 mg into the skin See admin instructions. Every 4 weeks Notes to  patient: Resume home regimen   ALPRAZolam 1 MG tablet Commonly known as: XANAX Take 0.5 mg by mouth 3 (three) times daily as needed for anxiety or sleep. Notes to patient: Resume home regimen   amitriptyline 25 MG tablet Commonly known as: ELAVIL Take 50 mg by mouth at bedtime.   Belsomra 20 MG Tabs Generic drug: Suvorexant Take 1 tablet by mouth at bedtime as needed for sleep. Notes to patient: Resume home regimen   Doxepin HCl 6 MG Tabs Take 6 mg by mouth at bedtime as needed for sleep. Notes to patient: Resume home regimen   estrogens-methylTEST 1.25-2.5 MG tablet Commonly known as: ESTRATEST Take 1 tablet by mouth at bedtime. Notes to patient: Resume home regimen   lamoTRIgine 200 MG tablet Commonly known as: LAMICTAL Take 400 mg by mouth at bedtime.   Levothyroxine Sodium 88 MCG Caps Take 88 mcg by mouth at bedtime.   Linzess 145 MCG Caps capsule Generic drug: linaclotide Take 145 mcg by mouth daily as needed for constipation. Notes to patient: Resume home regimen   meloxicam 7.5 MG tablet Commonly known as: MOBIC Take 7.5 mg by mouth 2 (two) times daily. Notes to patient: Resume home regimen   methocarbamol 500 MG tablet Commonly known as: Robaxin Take 1 tablet (500 mg total) by mouth 4 (four) times daily.   metoprolol tartrate 25 MG tablet Commonly known as: LOPRESSOR Take 25 mg by mouth at bedtime.  ondansetron 4 MG tablet Commonly known as: Zofran Take 1 tablet (4 mg total) by mouth daily as needed for nausea or vomiting. Notes to patient: Last dose given 08/31 08:09am   Ozempic (0.25 or 0.5 MG/DOSE) 2 MG/1.5ML Sopn Generic drug: Semaglutide(0.25 or 0.'5MG'$ /DOS) Inject 2 mg into the skin every 'Sunday. Notes to patient: Resume home regimen   rosuvastatin 40 MG tablet Commonly known as: CRESTOR Take 40 mg by mouth See admin instructions. Take 40 mg on Sun, Mon, Wed, Fri, and Sat   Saphris 10 MG Subl Generic drug: Asenapine Maleate Place 20 mg  under the tongue at bedtime.   tamsulosin 0.4 MG Caps capsule Commonly known as: FLOMAX Take 0.4 mg by mouth daily.   Ubrelvy 50 MG Tabs Generic drug: Ubrogepant Take 50 mg by mouth daily as needed for migraine. Notes to patient: Resume home regimen   Vitamin D 50 MCG (2000 UT) tablet Take 2,000 Units by mouth daily. Notes to patient: Resume home regimen   zonisamide 100 MG capsule Commonly known as: ZONEGRAN Take 400 mg by mouth at bedtime.       ASK your doctor about these medications    cephALEXin 500 MG capsule Commonly known as: KEFLEX Take 1 capsule (500 mg total) by mouth 4 (four) times daily for 3 days. Ask about: Should I take this medication?   oxyCODONE 5 MG immediate release tablet Commonly known as: Roxicodone Take 1 tablet (5 mg total) by mouth every 4 (four) hours as needed for up to 7 days. Ask about: Should I take this medication?   traMADol 50 MG tablet Commonly known as: Ultram Take 1 tablet (50 mg total) by mouth every 6 (six) hours as needed for up to 7 days. Ask about: Should I take this medication?               Discharge Care Instructions  (From admission, onward)           Start     Ordered   10/05/20 0000  Weight bearing as tolerated        10/05/20 0728   10/03/20 0000  Weight bearing as tolerated        08'$ /31/22 [id number]             Signed: Cordelia Pen Hanna Ra 10/18/2020, 6:49 AM

## 2020-11-05 ENCOUNTER — Other Ambulatory Visit: Payer: Self-pay | Admitting: Surgery

## 2020-11-05 DIAGNOSIS — R109 Unspecified abdominal pain: Secondary | ICD-10-CM

## 2020-11-07 ENCOUNTER — Ambulatory Visit (HOSPITAL_COMMUNITY)
Admission: RE | Admit: 2020-11-07 | Discharge: 2020-11-07 | Disposition: A | Discharge status: Home / Self Care | Payer: 59 | Source: Ambulatory Visit | Attending: Cardiovascular Disease | Admitting: Cardiovascular Disease

## 2020-11-07 ENCOUNTER — Other Ambulatory Visit (HOSPITAL_COMMUNITY): Payer: Self-pay | Admitting: Physician Assistant

## 2020-11-07 ENCOUNTER — Other Ambulatory Visit: Payer: Self-pay

## 2020-11-07 DIAGNOSIS — M79662 Pain in left lower leg: Secondary | ICD-10-CM

## 2020-11-07 DIAGNOSIS — M79661 Pain in right lower leg: Secondary | ICD-10-CM | POA: Diagnosis present

## 2020-11-07 DIAGNOSIS — M79605 Pain in left leg: Secondary | ICD-10-CM

## 2020-11-07 DIAGNOSIS — M79604 Pain in right leg: Secondary | ICD-10-CM

## 2020-11-22 ENCOUNTER — Other Ambulatory Visit: Payer: 59

## 2020-12-05 ENCOUNTER — Other Ambulatory Visit: Payer: Self-pay | Admitting: Family Medicine

## 2020-12-05 ENCOUNTER — Ambulatory Visit
Admission: RE | Admit: 2020-12-05 | Discharge: 2020-12-05 | Disposition: A | Discharge status: Home / Self Care | Payer: 59 | Source: Ambulatory Visit | Attending: Surgery | Admitting: Surgery

## 2020-12-05 ENCOUNTER — Other Ambulatory Visit: Payer: Self-pay

## 2020-12-05 DIAGNOSIS — R109 Unspecified abdominal pain: Secondary | ICD-10-CM

## 2020-12-05 DIAGNOSIS — Z1231 Encounter for screening mammogram for malignant neoplasm of breast: Secondary | ICD-10-CM

## 2020-12-05 MED ORDER — IOPAMIDOL (ISOVUE-300) INJECTION 61%
100.0000 mL | Freq: Once | INTRAVENOUS | Status: AC | PRN
  Administered 2020-12-05: 100 mL via INTRAVENOUS

## 2021-02-19 ENCOUNTER — Ambulatory Visit
Admission: RE | Admit: 2021-02-19 | Discharge: 2021-02-19 | Disposition: A | Discharge status: Home / Self Care | Payer: 59 | Source: Ambulatory Visit | Attending: Family Medicine | Admitting: Family Medicine

## 2021-02-19 ENCOUNTER — Other Ambulatory Visit: Payer: Self-pay

## 2021-02-19 DIAGNOSIS — Z1231 Encounter for screening mammogram for malignant neoplasm of breast: Secondary | ICD-10-CM

## 2021-03-24 DIAGNOSIS — M6281 Muscle weakness (generalized): Secondary | ICD-10-CM | POA: Insufficient documentation

## 2021-06-04 DIAGNOSIS — M7730 Calcaneal spur, unspecified foot: Secondary | ICD-10-CM | POA: Insufficient documentation

## 2021-10-20 DIAGNOSIS — M25869 Other specified joint disorders, unspecified knee: Secondary | ICD-10-CM | POA: Insufficient documentation

## 2021-11-22 ENCOUNTER — Ambulatory Visit
Admission: RE | Admit: 2021-11-22 | Discharge: 2021-11-22 | Disposition: A | Discharge status: Home / Self Care | Payer: 59 | Source: Ambulatory Visit | Attending: Nurse Practitioner | Admitting: Nurse Practitioner

## 2021-11-22 ENCOUNTER — Encounter: Payer: Self-pay | Admitting: Internal Medicine

## 2021-11-22 ENCOUNTER — Other Ambulatory Visit: Payer: Self-pay | Admitting: Nurse Practitioner

## 2021-11-22 DIAGNOSIS — R509 Fever, unspecified: Secondary | ICD-10-CM

## 2021-11-22 DIAGNOSIS — R059 Cough, unspecified: Secondary | ICD-10-CM

## 2021-11-25 ENCOUNTER — Other Ambulatory Visit: Payer: Self-pay | Admitting: Nurse Practitioner

## 2021-11-25 DIAGNOSIS — Z1231 Encounter for screening mammogram for malignant neoplasm of breast: Secondary | ICD-10-CM

## 2022-02-20 ENCOUNTER — Ambulatory Visit
Admission: RE | Admit: 2022-02-20 | Discharge: 2022-02-20 | Disposition: A | Discharge status: Home / Self Care | Payer: 59 | Source: Ambulatory Visit | Attending: Nurse Practitioner | Admitting: Nurse Practitioner

## 2022-02-20 DIAGNOSIS — Z1231 Encounter for screening mammogram for malignant neoplasm of breast: Secondary | ICD-10-CM

## 2022-03-30 DIAGNOSIS — M94262 Chondromalacia, left knee: Secondary | ICD-10-CM | POA: Insufficient documentation

## 2022-04-21 DIAGNOSIS — S43439A Superior glenoid labrum lesion of unspecified shoulder, initial encounter: Secondary | ICD-10-CM | POA: Insufficient documentation

## 2022-04-21 DIAGNOSIS — M7502 Adhesive capsulitis of left shoulder: Secondary | ICD-10-CM | POA: Insufficient documentation

## 2022-06-26 ENCOUNTER — Encounter: Payer: Self-pay | Admitting: Internal Medicine

## 2022-08-05 ENCOUNTER — Encounter: Payer: Self-pay | Admitting: Internal Medicine

## 2022-08-05 ENCOUNTER — Ambulatory Visit (AMBULATORY_SURGERY_CENTER): Payer: 59

## 2022-08-05 VITALS — Ht 67.0 in | Wt 225.0 lb

## 2022-08-05 DIAGNOSIS — Z86010 Personal history of colonic polyps: Secondary | ICD-10-CM

## 2022-08-05 MED ORDER — NA SULFATE-K SULFATE-MG SULF 17.5-3.13-1.6 GM/177ML PO SOLN: 1.0000 | Freq: Once | ORAL | 0 refills | Status: AC

## 2022-08-05 NOTE — Progress Notes (Addendum)
No egg or soy allergy known to patient  No issues known to pt with past sedation with any surgeries or procedures Patient denies ever being told they had issues or difficulty with intubation  No FH of Malignant Hyperthermia Pt is not on diet pills Pt is not on  home 02  Pt is not on blood thinners  Pt reports constipation  No A fib or A flutter Have any cardiac testing pending--no  LOA: independent  Prep: suprep extra miralax x 7 days   PV competed with patient. Prep instructions sent via mychart and home address. Goodrx coupon for [facility] provided to use for price reduction if needed.

## 2022-08-15 ENCOUNTER — Ambulatory Visit (AMBULATORY_SURGERY_CENTER): Payer: 59 | Admitting: Internal Medicine

## 2022-08-15 ENCOUNTER — Encounter: Payer: Self-pay | Admitting: Internal Medicine

## 2022-08-15 VITALS — BP 131/77 | HR 76 | Temp 97.5°F | Resp 12 | Ht 67.0 in | Wt 225.0 lb

## 2022-08-15 DIAGNOSIS — Z86010 Personal history of colonic polyps: Secondary | ICD-10-CM | POA: Diagnosis not present

## 2022-08-15 DIAGNOSIS — Z09 Encounter for follow-up examination after completed treatment for conditions other than malignant neoplasm: Secondary | ICD-10-CM | POA: Diagnosis not present

## 2022-08-15 MED ORDER — SODIUM CHLORIDE 0.9 % IV SOLN: 500.0000 mL | Freq: Once | INTRAVENOUS | Status: DC

## 2022-08-15 NOTE — Op Note (Signed)
[facility] Patient Name: Cynthia [name] Procedure Date: 08/15/2022 10:25 AM MRN: [id number] Endoscopist: Wilhemina Bonito. Marina Goodell , MD, [id number] Age: 57 Referring MD:  Date of Birth: Feb 11, 1965 Gender: Female Account #: [id number] Procedure:                Colonoscopy Indications:              High risk colon cancer surveillance: Personal                            history of adenoma (10 mm or greater in size), High                            risk colon cancer surveillance: Personal history of                            adenoma with villous component, High risk colon                            cancer surveillance: Personal history of multiple                            (3 or more) adenomas. Previous examinations 2011,                            2013, 2017, 2018 Medicines:                Monitored Anesthesia Care Procedure:                Pre-Anesthesia Assessment:                           - Prior to the procedure, a History and Physical                            was performed, and patient medications and                            allergies were reviewed. The patient's tolerance of                            previous anesthesia was also reviewed. The risks                            and benefits of the procedure and the sedation                            options and risks were discussed with the patient.                            All questions were answered, and informed consent                            was obtained. Prior Anticoagulants: The patient has  taken no anticoagulant or antiplatelet agents. ASA                            Grade Assessment: II - A patient with mild systemic                            disease. After reviewing the risks and benefits,                            the patient was deemed in satisfactory condition to                            undergo the procedure.                           After obtaining informed consent, the  colonoscope                            was passed under direct vision. Throughout the                            procedure, the patient's blood pressure, pulse, and                            oxygen saturations were monitored continuously. The                            CF [id number] #[id number] was introduced through the anus                            and advanced to the the cecum, identified by                            appendiceal orifice and ileocecal valve. The                            ileocecal valve, appendiceal orifice, and rectum                            were photographed. The quality of the bowel                            preparation was good. The colonoscopy was performed                            without difficulty. The patient tolerated the                            procedure well. The bowel preparation used was                            SUPREP via split dose instruction. Scope In: 10:38:13 AM Scope Out: 10:56:48 AM Scope Withdrawal Time: 0 hours 10 minutes 57 seconds  Total Procedure Duration:  0 hours 18 minutes 35 seconds  Findings:                 The entire examined colon appeared normal on direct                            and retroflexion views. Complications:            No immediate complications. Estimated blood loss:                            None. Estimated Blood Loss:     Estimated blood loss: none. Impression:               - The entire examined colon is normal on direct and                            retroflexion views.                           - No specimens collected. Recommendation:           - Repeat colonoscopy in 5 years for surveillance.                           - Patient has a contact number available for                            emergencies. The signs and symptoms of potential                            delayed complications were discussed with the                            patient. Return to normal activities tomorrow.                             Written discharge instructions were provided to the                            patient.                           - Resume previous diet.                           - Continue present medications. Wilhemina Bonito. Marina Goodell, MD 08/15/2022 11:03:43 AM This report has been signed electronically.

## 2022-08-15 NOTE — Progress Notes (Signed)
Pt's states no medical or surgical changes since previsit or office visit. 

## 2022-08-15 NOTE — Patient Instructions (Signed)
YOU HAD AN ENDOSCOPIC PROCEDURE TODAY AT THE [facility]:   Refer to the procedure report that was given to you for any specific questions about what was found during the examination.  If the procedure report does not answer your questions, please call your gastroenterologist to clarify.  If you requested that your care partner not be given the details of your procedure findings, then the procedure report has been included in a sealed envelope for you to review at your convenience later.  YOU SHOULD EXPECT: Some feelings of bloating in the abdomen. Passage of more gas than usual.  Walking can help get rid of the air that was put into your GI tract during the procedure and reduce the bloating. If you had a lower endoscopy (such as a colonoscopy or flexible sigmoidoscopy) you may notice spotting of blood in your stool or on the toilet paper. If you underwent a bowel prep for your procedure, you may not have a normal bowel movement for a few days.  Please Note:  You might notice some irritation and congestion in your nose or some drainage.  This is from the oxygen used during your procedure.  There is no need for concern and it should clear up in a day or so.  SYMPTOMS TO REPORT IMMEDIATELY:  Following lower endoscopy (colonoscopy or flexible sigmoidoscopy):  Excessive amounts of blood in the stool  Significant tenderness or worsening of abdominal pains  Swelling of the abdomen that is new, acute  Fever of 100F or higher  For urgent or emergent issues, a gastroenterologist can be reached at any hour by calling [phone]. Do not use MyChart messaging for urgent concerns.    DIET:  We do recommend a small meal at first, but then you may proceed to your regular diet.  Drink plenty of fluids but you should avoid alcoholic beverages for 24 hours.  ACTIVITY:  You should plan to take it easy for the rest of today and you should NOT DRIVE or use heavy machinery until tomorrow (because of  the sedation medicines used during the test).    FOLLOW UP: Our staff will call the number listed on your records the next business day following your procedure.  We will call around 7:15- 8:00 am to check on you and address any questions or concerns that you may have regarding the information given to you following your procedure. If we do not reach you, we will leave a message.      SIGNATURES/CONFIDENTIALITY: You and/or your care partner have signed paperwork which will be entered into your electronic medical record.  These signatures attest to the fact that that the information above on your After Visit Summary has been reviewed and is understood.  Full responsibility of the confidentiality of this discharge information lies with you and/or your care-partner. 

## 2022-08-15 NOTE — Progress Notes (Signed)
HISTORY OF PRESENT ILLNESS:  Cynthia [name] is a 57 y.o. female with a history of multiple and advanced adenomatous colon polyps as well as sessile serrated polyp.  Presents for surveillance colonoscopy  REVIEW OF SYSTEMS:  All non-GI ROS negative except for  Past Medical History:  Diagnosis Date   Allergy    Anxiety    Bladder disorder    Colitis 2006   occurred in presnce of SBO , no issues since    Colon polyps    Complication of anesthesia    problems waking up and PONV   Depression    Diverticulosis    Headache    Hyperlipemia    Hypothyroidism    Kidney stone    Paroxysmal ventricular tachycardia (HCC) 2015   Pericarditis 2015   PONV (postoperative nausea and vomiting)    Rapid heart rate    maybe once a month since pericarditis   Rosacea    SBO (small bowel obstruction) (HCC) 08/16/2015   Vitamin D deficiency     Past Surgical History:  Procedure Laterality Date   ACHILLES TENDON SURGERY Left    APPENDECTOMY     CARDIAC CATHETERIZATION  11/15/2008   SMOOTH AND NORMAL   CHOLECYSTECTOMY     COLONOSCOPY     LAPAROSCOPIC ASSISTED VENTRAL HERNIA REPAIR N/A 08/14/2017   Procedure: LAPAROSCOPIC ASSISTED VENTRAL HERNIA REPAIR ERAS PATHWAY, CECOPEXY;  Surgeon: Luretha Murphy, MD;  Location: [facility];  Service: General;  Laterality: N/A;   LAPAROSCOPY N/A 10/26/2015   Procedure: diagnostic LAPAROSCOPY LAPAROTOMY AND APPENDECTOMY;  Surgeon: Luretha Murphy, MD;  Location: [facility];  Service: General;  Laterality: N/A;   PATELLA-FEMORAL ARTHROPLASTY Right 10/03/2020   Procedure: PATELLA-FEMORAL ARTHROPLASTY;  Surgeon: Eugenia Mcalpine, MD;  Location: [facility];  Service: Orthopedics;  Laterality: Right;  adductor canal block   POLYPECTOMY  02/2009   SHOULDER SURGERY  2010   left   TOTAL ABDOMINAL HYSTERECTOMY W/ BILATERAL SALPINGOOPHORECTOMY  2005   UPPER GASTROINTESTINAL ENDOSCOPY      Social History JULIANNA ZENGEL  reports that she has never smoked. She has never used  smokeless tobacco. She reports that she does not drink alcohol and does not use drugs.  family history is not on file. She was adopted.  Allergies  Allergen Reactions   Prednisone Other (See Comments)    Eye Irritation  Other Reaction(s): insomnia, Other (See Comments)   Amoxicillin Other (See Comments)    Face, eyes, chest turned red.  Has tolerated Ancef.     Ampicillin Other (See Comments)    SERUM SICKNESS REACTION Has patient had a PCN reaction causing immediate rash, facial/tongue/throat swelling, SOB or lightheadedness with hypotension: Yes Has patient had a PCN reaction causing severe rash involving mucus membranes or skin necrosis: No Has patient had a PCN reaction that required hospitalization: No Has patient had a PCN reaction occurring within the last 10 years: No If all of the above answers are "NO", then may proceed with Cephalosporin use..  TOLERATED ANCEF.    Apple Fruit Extract Itching    Mouth itches inside, but no breathing impairment; treats with Benadryl   Apple Juice Itching    Mouth itches inside, but no breathing impairment; treats with Benadryl   Cherry Extract Itching    Mouth itches inside, but no breathing impairment; treats with Benadryl   Fruit & Vegetable Daily [Nutritional Supplements] Itching    FRUITS - Mouth itches inside, but no breathing impairment; treats with Benadryl   Imitrex [Sumatriptan]  Other (See Comments)    Chest Pain   Nutritional Supplements Itching    FRUITS - Mouth itches inside, but no breathing impairment; treats with Benadryl   Other Itching    Feline dander = Mouth itches inside, but no breathing impairment; treats with Benadryl Cat dander    Peach [Prunus Persica] Itching    Mouth itches inside, but no breathing impairment; treats with Benadryl   Strawberry Extract Itching    Mouth itches inside, but no breathing impairment; treats with Benadryl   Zoloft [Sertraline Hcl] Other (See Comments)    Causes hyperactivity        PHYSICAL EXAMINATION: Vital signs: BP 122/76   Pulse 77   Temp (!) 97.5 F (36.4 C) (Temporal)   Resp 10   Ht 5\' 7"  (1.702 m)   Wt 225 lb (102.1 kg)   SpO2 98%   BMI 35.24 kg/m  General: Well-developed, well-nourished, no acute distress HEENT: Sclerae are anicteric, conjunctiva pink. Oral mucosa intact Lungs: Clear Heart: Regular Abdomen: soft, nontender, nondistended, no obvious ascites, no peritoneal signs, normal bowel sounds. No organomegaly. Extremities: No edema Psychiatric: alert and oriented x3. Cooperative     ASSESSMENT:  History of multiple and advanced adenomas as well as sessile serrated polyps.   PLAN:  Surveillance colonoscopy

## 2022-08-15 NOTE — Progress Notes (Signed)
Vss nad trans to pacu 

## 2022-08-18 ENCOUNTER — Telehealth: Payer: Self-pay | Admitting: *Deleted

## 2022-08-18 NOTE — Telephone Encounter (Signed)
  Follow up Call-     08/15/2022    9:41 AM  Call back number  Post procedure Call Back phone  # [phone]  Permission to leave phone message Yes     Patient questions:  Do you have a fever, pain , or abdominal swelling? No. Pain Score  0 *  Have you tolerated food without any problems? Yes.    Have you been able to return to your normal activities? Yes.    Do you have any questions about your discharge instructions: Diet   No. Medications  No. Follow up visit  No.  Do you have questions or concerns about your Care? No.  Actions: * If pain score is 4 or above: No action needed, pain <4.

## 2022-12-02 ENCOUNTER — Encounter (HOSPITAL_COMMUNITY): Payer: Self-pay

## 2022-12-02 ENCOUNTER — Inpatient Hospital Stay (HOSPITAL_COMMUNITY)
Admission: EM | Admit: 2022-12-02 | Discharge: 2022-12-06 | DRG: 694 | Disposition: A | Discharge status: Home / Self Care | Payer: Medicare Other | Attending: Internal Medicine | Admitting: Internal Medicine

## 2022-12-02 ENCOUNTER — Other Ambulatory Visit: Payer: Self-pay

## 2022-12-02 DIAGNOSIS — K5900 Constipation, unspecified: Secondary | ICD-10-CM | POA: Diagnosis present

## 2022-12-02 DIAGNOSIS — E876 Hypokalemia: Secondary | ICD-10-CM | POA: Diagnosis present

## 2022-12-02 DIAGNOSIS — Z6834 Body mass index (BMI) 34.0-34.9, adult: Secondary | ICD-10-CM | POA: Diagnosis not present

## 2022-12-02 DIAGNOSIS — R09A2 Foreign body sensation, throat: Secondary | ICD-10-CM | POA: Diagnosis present

## 2022-12-02 DIAGNOSIS — Z91018 Allergy to other foods: Secondary | ICD-10-CM

## 2022-12-02 DIAGNOSIS — Z880 Allergy status to penicillin: Secondary | ICD-10-CM | POA: Diagnosis not present

## 2022-12-02 DIAGNOSIS — K2289 Other specified disease of esophagus: Secondary | ICD-10-CM | POA: Diagnosis not present

## 2022-12-02 DIAGNOSIS — Z87442 Personal history of urinary calculi: Secondary | ICD-10-CM | POA: Diagnosis not present

## 2022-12-02 DIAGNOSIS — K5730 Diverticulosis of large intestine without perforation or abscess without bleeding: Secondary | ICD-10-CM | POA: Diagnosis present

## 2022-12-02 DIAGNOSIS — Z9049 Acquired absence of other specified parts of digestive tract: Secondary | ICD-10-CM

## 2022-12-02 DIAGNOSIS — Z888 Allergy status to other drugs, medicaments and biological substances status: Secondary | ICD-10-CM

## 2022-12-02 DIAGNOSIS — Z7982 Long term (current) use of aspirin: Secondary | ICD-10-CM | POA: Diagnosis not present

## 2022-12-02 DIAGNOSIS — G43A0 Cyclical vomiting, not intractable: Secondary | ICD-10-CM | POA: Diagnosis not present

## 2022-12-02 DIAGNOSIS — N200 Calculus of kidney: Secondary | ICD-10-CM | POA: Diagnosis present

## 2022-12-02 DIAGNOSIS — K625 Hemorrhage of anus and rectum: Secondary | ICD-10-CM | POA: Diagnosis present

## 2022-12-02 DIAGNOSIS — N179 Acute kidney failure, unspecified: Secondary | ICD-10-CM | POA: Diagnosis present

## 2022-12-02 DIAGNOSIS — Z7985 Long-term (current) use of injectable non-insulin antidiabetic drugs: Secondary | ICD-10-CM | POA: Diagnosis not present

## 2022-12-02 DIAGNOSIS — Z90710 Acquired absence of both cervix and uterus: Secondary | ICD-10-CM

## 2022-12-02 DIAGNOSIS — Z79890 Hormone replacement therapy (postmenopausal): Secondary | ICD-10-CM

## 2022-12-02 DIAGNOSIS — E11649 Type 2 diabetes mellitus with hypoglycemia without coma: Secondary | ICD-10-CM | POA: Diagnosis present

## 2022-12-02 DIAGNOSIS — E785 Hyperlipidemia, unspecified: Secondary | ICD-10-CM | POA: Diagnosis present

## 2022-12-02 DIAGNOSIS — B952 Enterococcus as the cause of diseases classified elsewhere: Secondary | ICD-10-CM | POA: Diagnosis present

## 2022-12-02 DIAGNOSIS — N201 Calculus of ureter: Principal | ICD-10-CM

## 2022-12-02 DIAGNOSIS — R1310 Dysphagia, unspecified: Secondary | ICD-10-CM | POA: Diagnosis present

## 2022-12-02 DIAGNOSIS — F418 Other specified anxiety disorders: Secondary | ICD-10-CM | POA: Diagnosis present

## 2022-12-02 DIAGNOSIS — E039 Hypothyroidism, unspecified: Secondary | ICD-10-CM | POA: Diagnosis present

## 2022-12-02 DIAGNOSIS — E669 Obesity, unspecified: Secondary | ICD-10-CM | POA: Diagnosis present

## 2022-12-02 DIAGNOSIS — N202 Calculus of kidney with calculus of ureter: Secondary | ICD-10-CM | POA: Diagnosis not present

## 2022-12-02 DIAGNOSIS — Z8719 Personal history of other diseases of the digestive system: Secondary | ICD-10-CM

## 2022-12-02 DIAGNOSIS — Z860109 Personal history of other colon polyps: Secondary | ICD-10-CM

## 2022-12-02 DIAGNOSIS — Z79899 Other long term (current) drug therapy: Secondary | ICD-10-CM | POA: Diagnosis not present

## 2022-12-02 DIAGNOSIS — R1115 Cyclical vomiting syndrome unrelated to migraine: Secondary | ICD-10-CM | POA: Diagnosis not present

## 2022-12-02 DIAGNOSIS — Z889 Allergy status to unspecified drugs, medicaments and biological substances status: Secondary | ICD-10-CM | POA: Diagnosis not present

## 2022-12-02 DIAGNOSIS — R1031 Right lower quadrant pain: Secondary | ICD-10-CM | POA: Diagnosis not present

## 2022-12-02 DIAGNOSIS — Z90722 Acquired absence of ovaries, bilateral: Secondary | ICD-10-CM

## 2022-12-02 LAB — CBC WITH DIFFERENTIAL/PLATELET
Abs Immature Granulocytes: 0.02 10*3/uL (ref 0.00–0.07)
Basophils Absolute: 0.1 10*3/uL (ref 0.0–0.1)
Basophils Relative: 1 %
Eosinophils Absolute: 0.1 10*3/uL (ref 0.0–0.5)
Eosinophils Relative: 1 %
HCT: 48.7 % — ABNORMAL HIGH (ref 36.0–46.0)
Hemoglobin: 16.2 g/dL — ABNORMAL HIGH (ref 12.0–15.0)
Immature Granulocytes: 0 %
Lymphocytes Relative: 19 %
Lymphs Abs: 1.8 10*3/uL (ref 0.7–4.0)
MCH: 30.6 pg (ref 26.0–34.0)
MCHC: 33.3 g/dL (ref 30.0–36.0)
MCV: 92.1 fL (ref 80.0–100.0)
Monocytes Absolute: 0.8 10*3/uL (ref 0.1–1.0)
Monocytes Relative: 9 %
Neutro Abs: 6.5 10*3/uL (ref 1.7–7.7)
Neutrophils Relative %: 70 %
Platelets: 219 10*3/uL (ref 150–400)
RBC: 5.29 MIL/uL — ABNORMAL HIGH (ref 3.87–5.11)
RDW: 15 % (ref 11.5–15.5)
WBC: 9.2 10*3/uL (ref 4.0–10.5)
nRBC: 0 % (ref 0.0–0.2)

## 2022-12-02 LAB — URINALYSIS, ROUTINE W REFLEX MICROSCOPIC
Bilirubin Urine: NEGATIVE
Glucose, UA: NEGATIVE mg/dL
Ketones, ur: NEGATIVE mg/dL
Leukocytes,Ua: NEGATIVE
Nitrite: NEGATIVE
Protein, ur: 30 mg/dL — AB
Specific Gravity, Urine: 1.003 — ABNORMAL LOW (ref 1.005–1.030)
pH: 7 (ref 5.0–8.0)

## 2022-12-02 LAB — LIPASE, BLOOD: Lipase: 48 U/L (ref 11–51)

## 2022-12-02 LAB — COMPREHENSIVE METABOLIC PANEL
ALT: 14 U/L (ref 0–44)
AST: 27 U/L (ref 15–41)
Albumin: 4.2 g/dL (ref 3.5–5.0)
Alkaline Phosphatase: 73 U/L (ref 38–126)
Anion gap: 7 (ref 5–15)
BUN: 10 mg/dL (ref 6–20)
CO2: 27 mmol/L (ref 22–32)
Calcium: 9.1 mg/dL (ref 8.9–10.3)
Chloride: 99 mmol/L (ref 98–111)
Creatinine, Ser: 1.75 mg/dL — ABNORMAL HIGH (ref 0.44–1.00)
GFR, Estimated: 34 mL/min — ABNORMAL LOW (ref >60)
Glucose, Bld: 85 mg/dL (ref 70–99)
Potassium: 3.4 mmol/L — ABNORMAL LOW (ref 3.5–5.1)
Sodium: 133 mmol/L — ABNORMAL LOW (ref 135–145)
Total Bilirubin: 0.6 mg/dL (ref 0.3–1.2)
Total Protein: 7.7 g/dL (ref 6.5–8.1)

## 2022-12-02 LAB — HCG, QUANTITATIVE, PREGNANCY: hCG, Beta Chain, Quant, S: <1 m[IU]/mL (ref <5)

## 2022-12-02 MED ORDER — HYDROMORPHONE HCL 1 MG/ML IJ SOLN
0.5000 mg | Freq: Once | INTRAMUSCULAR | Status: AC
  Administered 2022-12-02: 0.5 mg via INTRAVENOUS
  Filled 2022-12-02: qty 1

## 2022-12-02 MED ORDER — ONDANSETRON HCL 4 MG/2ML IJ SOLN
4.0000 mg | Freq: Once | INTRAMUSCULAR | Status: AC
  Administered 2022-12-02: 4 mg via INTRAVENOUS
  Filled 2022-12-02: qty 2

## 2022-12-02 MED ORDER — SODIUM CHLORIDE 0.9 % IV BOLUS
1000.0000 mL | Freq: Once | INTRAVENOUS | Status: AC
  Administered 2022-12-02: 1000 mL via INTRAVENOUS

## 2022-12-02 NOTE — ED Triage Notes (Signed)
Pt complaining of bilateral flank pain, pt had a CT and showed bilateral kidney stones. Referred to the ER due for pain control.

## 2022-12-02 NOTE — H&P (Signed)
Cynthia [name]:[id number] DOB: 1966/01/25 DOA: 12/02/2022     PCP: Kaleen Mask, MD   Outpatient Specialists: * NONE CARDS: * Dr. None  NEphrology: *  Dr. No care team member to display  NEurology *   Dr. Pulmonary *  Dr.  Oncology * Dr.No care team member to display  GI* Dr.  Deboraha Sprang, [facility]) No care team member to display Urology Dr. *  Patient arrived to ER on 12/02/22 at 1724 Referred by Attending Loetta Rough, MD   Patient coming from:    home Lives alone,   *** With family From facility ***    Chief Complaint: *** Chief Complaint  Patient presents with   Flank Pain         HPI: Cynthia [name] is a 57 y.o. female with medical history significant of hypothyroidism, cyclic nausea and vomiting, obesity, HLD, Hypothyroidism        Presented with  falnk pian  Patient complaining of bilateral flank pain CT scan done showing bilateral kidney stones      Denies significant ETOH intake *** Does not smoke*** but interested in quitting***  No results found for: "SARSCOV2NAA"      Regarding pertinent Chronic problems: ***  ****Hyperlipidemia - *on statins {statin:315258}  Lipid Panel     Component Value Date/Time   CHOL 164 05/05/2017 0602   TRIG 101 05/05/2017 0602   HDL 25 (L) 05/05/2017 0602   CHOLHDL 6.6 05/05/2017 0602   VLDL 20 05/05/2017 0602   LDLCALC 119 (H) 05/05/2017 0602    ***HTN on   ***chronic CHF diastolic/systolic/ combined - last echo*** No results found for this or any previous visit (from the past 95621 hour(s)).  *** CAD  - On Aspirin, statin, betablocker, Plavix                 - *followed by cardiology                - last cardiac cath       ***DM 2 -  Lab Results  Component Value Date   HGBA1C 5.6 05/05/2017   ****on insulin, PO meds only, diet controlled  ***Hypothyroidism:   Lab Results  Component Value Date   TSH 0.744 05/05/2017   on synthroid  *** Morbid obesity-   BMI Readings from Last 1  Encounters:  12/02/22 34.30 kg/m     *** Asthma -well *** controlled on home inhalers/ nebs                     *** COPD - not **followed by pulmonology *** not  on baseline oxygen  *L,    *** OSA -on nocturnal oxygen, *CPAP, *noncompliant with CPAP  *** Hx of CVA - *with/out residual deficits on Aspirin 81 mg, 325, Plavix  ***A. Fib -   atrial fibrillation CHA2DS2 vas score **** CHA2DS2/VAS Stroke Risk Points      N/A >= 2 Points: High Risk  1 to 1.99 Points: Medium Risk  0 Points: Low Risk    Last Change: N/A      This score determines the patient's risk of having a stroke if the  patient has atrial fibrillation.      This score is not applicable to this patient. Components are not  calculated.     current  on anticoagulation with ****Coumadin  ***Xarelto,* Eliquis,  *** Not on anticoagulation secondary to Risk of Falls, *** recurrent bleeding         -  Rate control:  Currently controlled with ***Toprolol,  *Metoprolol,* Diltiazem, *Coreg          - Rhythm control: *** amiodarone, *flecainide  ***Hx of DVT/PE on - anticoagulation with ****Coumadin  ***Xarelto,* Eliquis,     ***CKD stage III*-   baseline Cr **** Estimated Creatinine Clearance: 44 mL/min (A) (by C-G formula based on SCr of 1.75 mg/dL (H)).  Lab Results  Component Value Date   CREATININE 1.75 (H) 12/02/2022   CREATININE 0.76 10/03/2020   CREATININE 1.01 (H) 10/03/2020   Lab Results  Component Value Date   NA 133 (L) 12/02/2022   CL 99 12/02/2022   K 3.4 (L) 12/02/2022   CO2 27 12/02/2022   BUN 10 12/02/2022   CREATININE 1.75 (H) 12/02/2022   GFRNONAA 34 (L) 12/02/2022   CALCIUM 9.1 12/02/2022   PHOS 2.9 08/16/2015   ALBUMIN 4.2 12/02/2022   GLUCOSE 85 12/02/2022    **** Liver disease Computed MELD 3.0 unavailable. One or more values for this score either were not found within the given timeframe or did not fit some other criterion. Computed MELD-Na unavailable. One or more values for this  score either were not found within the given timeframe or did not fit some other criterion.  Hepatic Function Panel     Component Value Date/Time   PROT 7.7 12/02/2022 2020   ALBUMIN 4.2 12/02/2022 2020   AST 27 12/02/2022 2020   ALT 14 12/02/2022 2020   ALKPHOS 73 12/02/2022 2020   BILITOT 0.6 12/02/2022 2020   No results found for requested labs within last 1095 days.  ***BPH - on Flomax, Proscar    *** Dementia - on Aricept** Nemenda  *** Chronic anemia - baseline hg Hemoglobin & Hematocrit  Recent Labs    12/02/22 2020  HGB 16.2*   Iron/TIBC/Ferritin/ %Sat No results found for: "IRON", "TIBC", "FERRITIN", "IRONPCTSAT"   Seizure DO - las seizure *** currently on     Cancer:     While in ER: Clinical Course as of 12/02/22 2306  Tue Dec 02, 2022  1921 D/w radiology. No one here now who can upload the CD with CT on it. [HN]  2205 Urinalysis, Routine w reflex microscopic -Urine, Clean Catch(!) No UTI [HN]  2206 Comprehensive metabolic panel(!) +AKI [HN]  2206 Hemoglobin(!): 16.2 Likely volume contraction [HN]  2206 Reevaluated patient who is still having significant pain. Will consult urology to FYI and to hospitalist for admission. [HN]    Clinical Course User Index [HN] Loetta Rough, MD         Lab Orders         CBC with Differential         Comprehensive metabolic panel         Lipase, blood         Urinalysis, Routine w reflex microscopic -Urine, Clean Catch         hCG, quantitative, pregnancy      CT HEAD *** NON acute   MRI brain  ***no acute CVA  CXR - ***NON acute  CTabd/pelvis - ***nonacute  CTA chest - ***nonacute, no PE, * no evidence of infiltrate  Following Medications were ordered in ER: Medications  ondansetron (ZOFRAN) injection 4 mg (4 mg Intravenous Given 12/02/22 2019)  HYDROmorphone (DILAUDID) injection 0.5 mg (0.5 mg Intravenous Given 12/02/22 2020)  HYDROmorphone (DILAUDID) injection 0.5 mg (0.5 mg Intravenous Given  12/02/22 2205)    _______________________________________________________ ER Provider Called:      Urology  Dr. Alvester Morin They Recommend admit to medicine   Will see in AM   Keep NPO rehydrate    ED Triage Vitals  Encounter Vitals Group     BP 12/02/22 1740 137/74     Systolic BP Percentile --      Diastolic BP Percentile --      Pulse Rate 12/02/22 1740 87     Resp 12/02/22 1740 18     Temp 12/02/22 1740 98.8 F (37.1 C)     Temp Source 12/02/22 1740 Oral     SpO2 12/02/22 1740 93 %     Weight 12/02/22 1740 219 lb (99.3 kg)     Height 12/02/22 1740 5\' 7"  (1.702 m)     Head Circumference --      Peak Flow --      Pain Score 12/02/22 1757 9     Pain Loc --      Pain Education --      Exclude from Growth Chart --   WGNF(62)@     _________________________________________ Significant initial  Findings: Abnormal Labs Reviewed  CBC WITH DIFFERENTIAL/PLATELET - Abnormal; Notable for the following components:      Result Value   RBC 5.29 (*)    Hemoglobin 16.2 (*)    HCT 48.7 (*)    All other components within normal limits  COMPREHENSIVE METABOLIC PANEL - Abnormal; Notable for the following components:   Sodium 133 (*)    Potassium 3.4 (*)    Creatinine, Ser 1.75 (*)    GFR, Estimated 34 (*)    All other components within normal limits  URINALYSIS, ROUTINE W REFLEX MICROSCOPIC - Abnormal; Notable for the following components:   Color, Urine STRAW (*)    Specific Gravity, Urine 1.003 (*)    Hgb urine dipstick SMALL (*)    Protein, ur 30 (*)    Bacteria, UA FEW (*)    All other components within normal limits      _________________________ Troponin ***ordered Cardiac Panel (last 3 results) No results for input(s): "CKTOTAL", "CKMB", "TROPONINIHS", "RELINDX" in the last 72 hours.   ECG: Ordered Personally reviewed and interpreted by me showing: HR : *** Rhythm: *NSR, Sinus tachycardia * A.fib. W RVR, RBBB, LBBB, Paced Ischemic changes*nonspecific changes, no evidence  of ischemic changes QTC*  BNP (last 3 results) No results for input(s): "BNP" in the last 8760 hours.   COVID-19 Labs  No results for input(s): "DDIMER", "FERRITIN", "LDH", "CRP" in the last 72 hours.  No results found for: "SARSCOV2NAA"     ____________________ This patient meets SIRS Criteria and may be septic. SIRS = Systemic Inflammatory Response Syndrome  Order a lactic acid level if needed AND/OR Initiate the sepsis protocol with the attached order set OR Click "Treating Associated Infection or Illness" if the patient is being treated for an infection that is a known cause of these abnormalities     The recent clinical data is shown below. Vitals:   12/02/22 1740 12/02/22 2114  BP: 137/74 125/71  Pulse: 87 78  Resp: 18 16  Temp: 98.8 F (37.1 C) 98.7 F (37.1 C)  TempSrc: Oral   SpO2: 93% 99%  Weight: 99.3 kg   Height: 5\' 7"  (1.702 m)     WBC     Component Value Date/Time   WBC 9.2 12/02/2022 2020   LYMPHSABS 1.8 12/02/2022 2020   MONOABS 0.8 12/02/2022 2020   EOSABS 0.1 12/02/2022 2020   BASOSABS 0.1 12/02/2022 2020  Lactic Acid, Venous    Component Value Date/Time   LATICACIDVEN 0.7 11/07/2015 0826      Lactic Acid, Venous    Component Value Date/Time   LATICACIDVEN 0.7 11/07/2015 0826    Procalcitonin *** Ordered      UA *** no evidence of UTI  ***Pending ***not ordered   Urine analysis:    Component Value Date/Time   COLORURINE STRAW (A) 12/02/2022 1948   APPEARANCEUR CLEAR 12/02/2022 1948   LABSPEC 1.003 (L) 12/02/2022 1948   PHURINE 7.0 12/02/2022 1948   GLUCOSEU NEGATIVE 12/02/2022 1948   HGBUR SMALL (A) 12/02/2022 1948   BILIRUBINUR NEGATIVE 12/02/2022 1948   KETONESUR NEGATIVE 12/02/2022 1948   PROTEINUR 30 (A) 12/02/2022 1948   NITRITE NEGATIVE 12/02/2022 1948   LEUKOCYTESUR NEGATIVE 12/02/2022 1948    Results for orders placed or performed during the hospital encounter of 09/25/20  Surgical pcr screen     Status:  None   Collection Time: 09/25/20  1:35 PM   Specimen: Nasal Mucosa; Nasal Swab  Result Value Ref Range Status   MRSA, PCR NEGATIVE NEGATIVE Final   Staphylococcus aureus NEGATIVE NEGATIVE Final    Comment: (NOTE) The Xpert SA Assay (FDA approved for NASAL specimens in patients 56 years of age and older), is one component of a comprehensive surveillance program. It is not intended to diagnose infection nor to guide or monitor treatment. Performed at [facility], [address] [place], [place] [address]     ABX started Antibiotics Given (last 72 hours)     None       No results found for the last 90 days.     ________________________________________________________________  Arterial ***Venous  Blood Gas result:  pH *** pCO2 ***; pO2 ***;     %O2 Sat ***.  ABG    Component Value Date/Time   PHART 7.383 11/15/2008 0957   PCO2ART 40.0 11/15/2008 0957   PO2ART 77.0 (L) 11/15/2008 0957   HCO3 24.7 (H) 11/15/2008 1004   TCO2 26 11/15/2008 1004   ACIDBASEDEF 1.0 11/15/2008 1004   O2SAT 74.0 11/15/2008 1004    __________________________________________________________ Recent Labs  Lab 12/02/22 2020  NA 133*  K 3.4*  CO2 27  GLUCOSE 85  BUN 10  CREATININE 1.75*  CALCIUM 9.1    Cr  Up from baseline see below Lab Results  Component Value Date   CREATININE 1.75 (H) 12/02/2022   CREATININE 0.76 10/03/2020   CREATININE 1.01 (H) 10/03/2020    Recent Labs  Lab 12/02/22 2020  AST 27  ALT 14  ALKPHOS 73  BILITOT 0.6  PROT 7.7  ALBUMIN 4.2   Lab Results  Component Value Date   CALCIUM 9.1 12/02/2022   PHOS 2.9 08/16/2015    Plt: Lab Results  Component Value Date   PLT 219 12/02/2022       Recent Labs  Lab 12/02/22 2020  WBC 9.2  NEUTROABS 6.5  HGB 16.2*  HCT 48.7*  MCV 92.1  PLT 219    HG/HCT  stable     Component Value Date/Time   HGB 16.2 (H) 12/02/2022 2020   HCT 48.7 (H) 12/02/2022 2020   MCV 92.1 12/02/2022  2020     Recent Labs  Lab 12/02/22 2020  LIPASE 48     _______________________________________________ Hospitalist was called for admission for   Ureterolithiasis    The following Work up has been ordered so far:  Orders Placed This Encounter  Procedures   CBC with Differential  Comprehensive metabolic panel   Lipase, blood   Urinalysis, Routine w reflex microscopic -Urine, Clean Catch   hCG, quantitative, pregnancy   Diet NPO time specified   Consult to urology   Consult to hospitalist   Pulse oximetry, continuous     OTHER Significant initial  Findings:  labs showing:     DM  labs:  HbA1C: No results for input(s): "HGBA1C" in the last 8760 hours.     CBG (last 3)  No results for input(s): "GLUCAP" in the last 72 hours.        Cultures:    Component Value Date/Time   SDES BLOOD BLOOD LEFT ARM 11/07/2015 0418   SPECREQUEST BOTTLES DRAWN AEROBIC ONLY 5CC 11/07/2015 0418   CULT NO GROWTH 5 DAYS 11/07/2015 0418   REPTSTATUS 11/12/2015 FINAL 11/07/2015 0418     Radiological Exams on Admission: No results found. _______________________________________________________________________________________________________ Latest  Blood pressure 125/71, pulse 78, temperature 98.7 F (37.1 C), resp. rate 16, height 5\' 7"  (1.702 m), weight 99.3 kg, SpO2 99%.   Vitals  labs and radiology finding personally reviewed  Review of Systems:    Pertinent positives include: flank pain  Constitutional:  No weight loss, night sweats, Fevers, chills, fatigue, weight loss  HEENT:  No headaches, Difficulty swallowing,Tooth/dental problems,Sore throat,  No sneezing, itching, ear ache, nasal congestion, post nasal drip,  Cardio-vascular:  No chest pain, Orthopnea, PND, anasarca, dizziness, palpitations.no Bilateral lower extremity swelling  GI:  No heartburn, indigestion, abdominal pain, nausea, vomiting, diarrhea, change in bowel habits, loss of appetite, melena, blood in stool,  hematemesis Resp:  no shortness of breath at rest. No dyspnea on exertion, No excess mucus, no productive cough, No non-productive cough, No coughing up of blood.No change in color of mucus.No wheezing. Skin:  no rash or lesions. No jaundice GU:  no dysuria, change in color of urine, no urgency or frequency. No straining to urinate.  No flank pain.  Musculoskeletal:  No joint pain or no joint swelling. No decreased range of motion. No back pain.  Psych:  No change in mood or affect. No depression or anxiety. No memory loss.  Neuro: no localizing neurological complaints, no tingling, no weakness, no double vision, no gait abnormality, no slurred speech, no confusion  All systems reviewed and apart from HOPI all are negative _______________________________________________________________________________________________ Past Medical History:   Past Medical History:  Diagnosis Date   Allergy    Anxiety    Bladder disorder    Colitis 2006   occurred in presnce of SBO , no issues since    Colon polyps    Complication of anesthesia    problems waking up and PONV   Depression    Diverticulosis    Headache    Hyperlipemia    Hypothyroidism    Kidney stone    Paroxysmal ventricular tachycardia (HCC) 2015   Pericarditis 2015   PONV (postoperative nausea and vomiting)    Rapid heart rate    maybe once a month since pericarditis   Rosacea    SBO (small bowel obstruction) (HCC) 08/16/2015   Vitamin D deficiency      Past Surgical History:  Procedure Laterality Date   ACHILLES TENDON SURGERY Left    APPENDECTOMY     CARDIAC CATHETERIZATION  11/15/2008   SMOOTH AND NORMAL   CHOLECYSTECTOMY     COLONOSCOPY     LAPAROSCOPIC ASSISTED VENTRAL HERNIA REPAIR N/A 08/14/2017   Procedure: LAPAROSCOPIC ASSISTED VENTRAL HERNIA REPAIR ERAS PATHWAY, CECOPEXY;  Surgeon:  Luretha Murphy, MD;  Location: [facility];  Service: General;  Laterality: N/A;   LAPAROSCOPY N/A 10/26/2015   Procedure:  diagnostic LAPAROSCOPY LAPAROTOMY AND APPENDECTOMY;  Surgeon: Luretha Murphy, MD;  Location: [facility];  Service: General;  Laterality: N/A;   PATELLA-FEMORAL ARTHROPLASTY Right 10/03/2020   Procedure: PATELLA-FEMORAL ARTHROPLASTY;  Surgeon: Eugenia Mcalpine, MD;  Location: [facility];  Service: Orthopedics;  Laterality: Right;  adductor canal block   POLYPECTOMY  02/2009   SHOULDER SURGERY  2010   left   TOTAL ABDOMINAL HYSTERECTOMY W/ BILATERAL SALPINGOOPHORECTOMY  2005   UPPER GASTROINTESTINAL ENDOSCOPY      Social History:  Ambulatory   independently       reports that she has never smoked. She has never used smokeless tobacco. She reports that she does not drink alcohol and does not use drugs.   Family History:   Family History  Adopted: Yes  Problem Relation Age of Onset   Breast cancer Neg Hx    ______________________________________________________________________________________________ Allergies: Allergies  Allergen Reactions   Prednisone Other (See Comments)    Eye Irritation  Other Reaction(s): insomnia, Other (See Comments)   Amoxicillin Other (See Comments)    Face, eyes, chest turned red.  Has tolerated Ancef.     Ampicillin Other (See Comments)    SERUM SICKNESS REACTION Has patient had a PCN reaction causing immediate rash, facial/tongue/throat swelling, SOB or lightheadedness with hypotension: Yes Has patient had a PCN reaction causing severe rash involving mucus membranes or skin necrosis: No Has patient had a PCN reaction that required hospitalization: No Has patient had a PCN reaction occurring within the last 10 years: No If all of the above answers are "NO", then may proceed with Cephalosporin use..  TOLERATED ANCEF.    Apple Fruit Extract Itching    Mouth itches inside, but no breathing impairment; treats with Benadryl   Apple Juice Itching    Mouth itches inside, but no breathing impairment; treats with Benadryl   Cherry Extract Itching    Mouth  itches inside, but no breathing impairment; treats with Benadryl   Fruit & Vegetable Daily [Nutritional Supplements] Itching    FRUITS - Mouth itches inside, but no breathing impairment; treats with Benadryl   Imitrex [Sumatriptan] Other (See Comments)    Chest Pain   Nutritional Supplements Itching    FRUITS - Mouth itches inside, but no breathing impairment; treats with Benadryl   Other Itching    Feline dander = Mouth itches inside, but no breathing impairment; treats with Benadryl Cat dander    Peach [Prunus Persica] Itching    Mouth itches inside, but no breathing impairment; treats with Benadryl   Strawberry Extract Itching    Mouth itches inside, but no breathing impairment; treats with Benadryl   Zoloft [Sertraline Hcl] Other (See Comments)    Causes hyperactivity    Prior to Admission medications   Medication Sig Start Date End Date Taking? Authorizing Provider  acetaminophen (TYLENOL) 500 MG tablet Take 1,000 mg by mouth daily as needed for moderate pain or headache.    [provider]  ALPRAZolam Prudy Feeler) 1 MG tablet Take 0.5 mg by mouth 3 (three) times daily as needed for anxiety or sleep.    [provider]  aspirin 81 MG chewable tablet Chew 81 mg by mouth daily.    [provider]  Cholecalciferol (VITAMIN D) 50 MCG (2000 UT) tablet Take 2,000 Units by mouth daily.    [provider]  DAYVIGO 10 MG TABS  Take 1 tablet by mouth at bedtime.    [provider]  diclofenac (VOLTAREN) 75 MG EC tablet Take 75 mg by mouth 2 (two) times daily.    [provider]  Doxepin HCl 6 MG TABS Take 6 mg by mouth at bedtime as needed for sleep. 09/11/20   [provider]  doxycycline (VIBRAMYCIN) 100 MG capsule Take 100 mg by mouth 2 (two) times daily. 08/04/22   [provider]  estrogens-methylTEST (ESTRATEST) 1.25-2.5 MG tablet Take 1 tablet by mouth at bedtime.  03/30/17   [provider]  lamoTRIgine (LAMICTAL)  200 MG tablet Take 400 mg by mouth at bedtime.    [provider]  Levothyroxine Sodium 88 MCG CAPS Take 88 mcg by mouth at bedtime.    [provider]  metoprolol succinate (TOPROL-XL) 50 MG 24 hr tablet Take 50 mg by mouth daily.    [provider]  NURTEC 75 MG TBDP Take 1 tablet by mouth daily as needed. 07/23/22   [provider]  OZEMPIC, 2 MG/DOSE, 8 MG/3ML SOPN INJECT 2 MG SUBCUTANEOUSLY EVERY WEEK FOR DIABETES. 02/08/21   [provider]  RHOFADE 1 % CREA Apply 1 Application topically every morning.    [provider]  rosuvastatin (CRESTOR) 40 MG tablet Take 40 mg by mouth daily. Take 40 mg on Sun, Mon, Wed, Fri, and Sat 04/12/17   [provider]  SPRIX 15.75 MG/SPRAY SOLN Place into the nose. 11/12/21   [provider]  tamsulosin (FLOMAX) 0.4 MG CAPS capsule Take 0.4 mg by mouth at bedtime. 02/15/21   [provider]    ___________________________________________________________________________________________________ Physical Exam:    12/02/2022    9:14 PM 12/02/2022    5:40 PM 08/15/2022   11:19 AM  Vitals with BMI  Height  5\' 7"    Weight  219 lbs   BMI  34.29   Systolic 125 137 756  Diastolic 71 74 77  Pulse 78 87 76    1. General:  in No  Acute distress***increased work of breathing ***complaining of severe pain****agitated * Chronically ill *well *cachectic *toxic acutely ill -appearing 2. Psychological: Alert and *** Oriented 3. Head/ENT:   Moist *** Dry Mucous Membranes                          Head Non traumatic, neck supple                          Normal *** Poor Dentition 4. SKIN: normal *** decreased Skin turgor,  Skin clean Dry and intact no rash    5. Heart: Regular rate and rhythm no*** Murmur, no Rub or gallop 6. Lungs: ***Clear to auscultation bilaterally, no wheezes or crackles   7. Abdomen: Soft, ***non-tender, Non distended *** obese ***bowel sounds present 8. Lower  extremities: no clubbing, cyanosis, no ***edema 9. Neurologically Grossly intact, moving all 4 extremities equally *** strength 5 out of 5 in all 4 extremities cranial nerves II through XII intact 10. MSK: Normal range of motion    Chart has been reviewed  ______________________________________________________________________________________________  Assessment/Plan  ***  Admitted for *** Ureterolithiasis ***    Present on Admission: **None**     No problem-specific Assessment & Plan notes found for this encounter.    Other plan as per orders.  DVT prophylaxis:  SCD *** Lovenox       Code Status:  Code Status: Prior FULL CODE *** DNR/DNI ***comfort care as per patient ***family  I had personally discussed CODE STATUS with patient and family*  ACP *** none has been reviewed ***   Family Communication:   Family not at  Bedside  plan of care was discussed on the phone with *** Son, Daughter, Wife, Husband, Sister, Brother , father, mother  Diet  Diet Orders (From admission, onward)     Start     Ordered   12/02/22 2222  Diet NPO time specified  Diet effective now        12/02/22 2222            Disposition Plan:   *** likely will need placement for rehabilitation                          Back to current facility when stable                            To home once workup is complete and patient is stable  ***Following barriers for discharge:                             Chest pain *** Stroke *** work up is complete                            Electrolytes corrected                               Anemia corrected h/H stable                             Pain controlled with PO medications                               Afebrile, white count improving able to transition to PO antibiotics                             Will need to be able to tolerate PO                            Will likely need home health, home O2, set up                           Will need  consultants to evaluate patient prior to discharge       Consult Orders  (From admission, onward)           Start     Ordered   12/02/22 2223  Consult to hospitalist  Once       Provider:  (Not yet assigned)  Question Answer Comment  Place call to: [facility]   Reason for Consult Admit      12/02/22 2222                              ***Would benefit from PT/OT eval prior to DC  Ordered  Swallow eval - SLP ordered                   Diabetes care coordinator                   Transition of care consulted                   Nutrition    consulted                  Wound care  consulted                   Palliative care    consulted                   Behavioral health  consulted                    Consults called: ***     Admission status:  ED Disposition     ED Disposition  Admit   Condition  --   Comment  The patient appears reasonably stabilized for admission considering the current resources, flow, and capabilities available in the ED at this time, and I doubt any other [facility] requiring further screening and/or treatment in the ED prior to admission is  present.           Obs***  ***  inpatient     I Expect 2 midnight stay secondary to severity of patient's current illness need for inpatient interventions justified by the following: ***hemodynamic instability despite optimal treatment (tachycardia *hypotension * tachypnea *hypoxia, hypercapnia) * Severe lab/radiological/exam abnormalities including:     and extensive comorbidities including: *substance abuse  *Chronic pain *DM2  * CHF * CAD  * COPD/asthma *Morbid Obesity * CKD *dementia *liver disease *history of stroke with residual deficits *  malignancy, * sickle cell disease  History of amputation Chronic anticoagulation  That are currently affecting medical management.   I expect  patient to be hospitalized for 2 midnights requiring inpatient medical  care.  Patient is at high risk for adverse outcome (such as loss of life or disability) if not treated.  Indication for inpatient stay as follows:  Severe change from baseline regarding mental status Hemodynamic instability despite maximal medical therapy,  ongoing suicidal ideations,  severe pain requiring acute inpatient management,  inability to maintain oral hydration   persistent chest pain despite medical management Need for operative/procedural  intervention New or worsening hypoxia   Need for IV antibiotics, IV fluids, IV rate controling medications, IV antihypertensives, IV pain medications, IV anticoagulation, need for biPAP    Level of care   *** tele  For 12H 24H     medical floor       progressive     stepdown   tele indefinitely please discontinue once patient no longer qualifies COVID-19 Labs    No results found for: "[id number]"   Precautions: admitted as *** Covid Negative  ***asymptomatic screening protocol****PUI *** covid positive No active isolations ***If Covid PCR is negative  - please DC precautions - would need additional investigation given very high risk for false native test result    Critical***  Patient is critically ill due to  hemodynamic instability * respiratory failure *severe sepsis* ongoing chest pain*  They are at high risk for life/limb threatening clinical deterioration requiring frequent reassessment and modifications of care.  Services provided include examination of the patient, review of relevant ancillary tests, prescription of  lifesaving therapies, review of medications and prophylactic therapy.  Total critical care time excluding separately billable procedures: 60*  Minutes.    Arinze Rivadeneira 12/02/2022, 11:06 PM ***  [facility]     after 2 AM please page floor coverage PA If 7AM-7PM, please contact the day team taking care of the patient using [email or web address]

## 2022-12-02 NOTE — ED Provider Notes (Incomplete)
[place] EMERGENCY DEPARTMENT AT [facility] Provider Note   CSN: [id number] Arrival date & time: 12/02/22  1724     History {Add pertinent medical, surgical, social history, OB history to HPI:1} Chief Complaint  Patient presents with  . Flank Pain         Cynthia [name] is a 57 y.o. female with PMH as listed below who presents with bilateral flank pain and nausea.  She states that she has a history of the stones bilaterally requiring bilateral stent placement years ago.  She states that she is having flank pain radiating to right lower quadrant worsening over the last several days similar to prior kidney stones.  Saw a primary care physician today who ordered an outpatient CT scan which reportedly showed bilateral urolithiasis, right stone 8 mm and left stone 4 mm.  When asked if there was any obstruction patient states that she does not think there was.  She denies any fevers or urinary symptoms, vaginal or diarrheal symptoms.  She brings with her a CD of the CT scan taken earlier today.   Past Medical History:  Diagnosis Date  . Allergy   . Anxiety   . Bladder disorder   . Colitis 2006   occurred in presnce of SBO , no issues since   . Colon polyps   . Complication of anesthesia    problems waking up and PONV  . Depression   . Diverticulosis   . Headache   . Hyperlipemia   . Hypothyroidism   . Kidney stone   . Paroxysmal ventricular tachycardia (HCC) 2015  . Pericarditis 2015  . PONV (postoperative nausea and vomiting)   . Rapid heart rate    maybe once a month since pericarditis  . Rosacea   . SBO (small bowel obstruction) (HCC) 08/16/2015  . Vitamin D deficiency        Home Medications Prior to Admission medications   Medication Sig Start Date End Date Taking? Authorizing Provider  acetaminophen (TYLENOL) 500 MG tablet Take 1,000 mg by mouth daily as needed for moderate pain or headache.    [provider]  ALPRAZolam Prudy Feeler) 1 MG tablet  Take 0.5 mg by mouth 3 (three) times daily as needed for anxiety or sleep.    [provider]  aspirin 81 MG chewable tablet Chew 81 mg by mouth daily.    [provider]  Cholecalciferol (VITAMIN D) 50 MCG (2000 UT) tablet Take 2,000 Units by mouth daily.    [provider]  DAYVIGO 10 MG TABS Take 1 tablet by mouth at bedtime.    [provider]  diclofenac (VOLTAREN) 75 MG EC tablet Take 75 mg by mouth 2 (two) times daily.    [provider]  Doxepin HCl 6 MG TABS Take 6 mg by mouth at bedtime as needed for sleep. 09/11/20   [provider]  doxycycline (VIBRAMYCIN) 100 MG capsule Take 100 mg by mouth 2 (two) times daily. 08/04/22   [provider]  estrogens-methylTEST (ESTRATEST) 1.25-2.5 MG tablet Take 1 tablet by mouth at bedtime.  03/30/17   [provider]  lamoTRIgine (LAMICTAL) 200 MG tablet Take 400 mg by mouth at bedtime.    [provider]  Levothyroxine Sodium 88 MCG CAPS Take 88 mcg by mouth at bedtime.    [provider]  metoprolol succinate (TOPROL-XL) 50 MG 24 hr tablet Take 50 mg by mouth daily.    [provider]  NURTEC 75  MG TBDP Take 1 tablet by mouth daily as needed. 07/23/22   [provider]  OZEMPIC, 2 MG/DOSE, 8 MG/3ML SOPN INJECT 2 MG SUBCUTANEOUSLY EVERY WEEK FOR DIABETES. 02/08/21   [provider]  RHOFADE 1 % CREA Apply 1 Application topically every morning.    [provider]  rosuvastatin (CRESTOR) 40 MG tablet Take 40 mg by mouth daily. Take 40 mg on Sun, Mon, Wed, Fri, and Sat 04/12/17   [provider]  SPRIX 15.75 MG/SPRAY SOLN Place into the nose. 11/12/21   [provider]  tamsulosin (FLOMAX) 0.4 MG CAPS capsule Take 0.4 mg by mouth at bedtime. 02/15/21   [provider]      Allergies    Prednisone, Amoxicillin, Ampicillin, Apple fruit extract, Apple juice, Cherry extract, Fruit & vegetable daily [nutritional  supplements], Imitrex [sumatriptan], Nutritional supplements, Other, Peach [prunus persica], Strawberry extract, and Zoloft [sertraline hcl]    Review of Systems   Review of Systems A 10 point review of systems was performed and is negative unless otherwise reported in HPI.  Physical Exam Updated Vital Signs BP 125/71   Pulse 78   Temp 98.7 F (37.1 C)   Resp 16   Ht 5\' 7"  (1.702 m)   Wt 99.3 kg   SpO2 99%   BMI 34.30 kg/m  Physical Exam General: Uncomfortable appearing female, lying in bed.  HEENT: PERRLA, Sclera anicteric, MMM, trachea midline.  Cardiology: RRR, no murmurs/rubs/gallops.  Resp: Normal respiratory rate and effort. CTAB, no wheezes, rhonchi, crackles.  Abd: Right lower quadrant tenderness to palpation.  Soft,  non-distended. No rebound tenderness or guarding.  GU: Deferred. MSK: No peripheral edema or signs of trauma.  Skin: warm, dry. No rashes or lesions. Back: +BL CVA TTP.  Neuro: A&Ox4, CNs II-XII grossly intact. MAEs. Sensation grossly intact.  Psych: Normal mood and affect.   ED Results / Procedures / Treatments   Labs (all labs ordered are listed, but only abnormal results are displayed) Labs Reviewed  CBC WITH DIFFERENTIAL/PLATELET - Abnormal; Notable for the following components:      Result Value   RBC 5.29 (*)    Hemoglobin 16.2 (*)    HCT 48.7 (*)    All other components within normal limits  COMPREHENSIVE METABOLIC PANEL - Abnormal; Notable for the following components:   Sodium 133 (*)    Potassium 3.4 (*)    Creatinine, Ser 1.75 (*)    GFR, Estimated 34 (*)    All other components within normal limits  URINALYSIS, ROUTINE W REFLEX MICROSCOPIC - Abnormal; Notable for the following components:   Color, Urine STRAW (*)    Specific Gravity, Urine 1.003 (*)    Hgb urine dipstick SMALL (*)    Protein, ur 30 (*)    Bacteria, UA FEW (*)    All other components within normal limits  LIPASE, BLOOD  HCG, QUANTITATIVE, PREGNANCY     EKG None  Radiology No results found.  Procedures Procedures  {Document cardiac monitor, telemetry assessment procedure when appropriate:1}  Medications Ordered in ED Medications  HYDROmorphone (DILAUDID) injection 0.5 mg (has no administration in time range)  ondansetron (ZOFRAN) injection 4 mg (4 mg Intravenous Given 12/02/22 2019)  HYDROmorphone (DILAUDID) injection 0.5 mg (0.5 mg Intravenous Given 12/02/22 2020)  HYDROmorphone (DILAUDID) injection 0.5 mg (0.5 mg Intravenous Given 12/02/22 2205)  sodium chloride 0.9 % bolus 1,000 mL (1,000 mLs Intravenous New Bag/Given 12/02/22 2314)    ED Course/ Medical Decision Making/ A&P  Medical Decision Making Amount and/or Complexity of Data Reviewed Labs: ordered. Decision-making details documented in ED Course.  Risk Prescription drug management. Decision regarding hospitalization.    This patient presents to the ED for concern of urolithiasis, this involves an extensive number of treatment options, and is a complaint that carries with it a high risk of complications and morbidity.  I considered the following differential and admission for this acute, potentially life threatening condition.   MDM:    Patient with reported bilateral ureteral lithiasis on an outpatient CT scan earlier today.  Unfortunately I inquired with radiology who states there is no one who can upload the images into the patient's chart right now.  However with the CD you can look at the images on the computer, possibly there will be somebody tomorrow who can uploaded into the chart.  I for the time being gave the CD back to the patient.  ***  Clinical Course as of 12/02/22 2348  Tue Dec 02, 2022  1921 D/w radiology. No one here now who can upload the CD with CT on it. [HN]  2205 Urinalysis, Routine w reflex microscopic -Urine, Clean Catch(!) No UTI [HN]  2206 Comprehensive metabolic panel(!) +AKI [HN]  2206 Hemoglobin(!):  16.2 Likely volume contraction [HN]  2206 Reevaluated patient who is still having significant pain. Will consult urology to [facility] and to hospitalist for admission. [HN]    Clinical Course User Index [HN] Loetta Rough, MD    Labs: I Ordered, and personally interpreted labs.  The pertinent results include:  ***  Imaging Studies ordered: I ordered imaging studies including *** I independently visualized and interpreted imaging. I agree with the radiologist interpretation  Additional history obtained from ***.  External records from outside source obtained and reviewed including ***  Cardiac Monitoring: .The patient was maintained on a cardiac monitor.  I personally viewed and interpreted the cardiac monitored which showed an underlying rhythm of: ***  Reevaluation: After the interventions noted above, I reevaluated the patient and found that they have :{resolved/improved/worsened:23923::"improved"}  Social Determinants of Health: .***  Disposition:  ***  Co morbidities that complicate the patient evaluation . Past Medical History:  Diagnosis Date  . Allergy   . Anxiety   . Bladder disorder   . Colitis 2006   occurred in presnce of SBO , no issues since   . Colon polyps   . Complication of anesthesia    problems waking up and PONV  . Depression   . Diverticulosis   . Headache   . Hyperlipemia   . Hypothyroidism   . Kidney stone   . Paroxysmal ventricular tachycardia (HCC) 2015  . Pericarditis 2015  . PONV (postoperative nausea and vomiting)   . Rapid heart rate    maybe once a month since pericarditis  . Rosacea   . SBO (small bowel obstruction) (HCC) 08/16/2015  . Vitamin D deficiency      Medicines Meds ordered this encounter  Medications  . ondansetron (ZOFRAN) injection 4 mg  . HYDROmorphone (DILAUDID) injection 0.5 mg  . HYDROmorphone (DILAUDID) injection 0.5 mg  . sodium chloride 0.9 % bolus 1,000 mL  . HYDROmorphone (DILAUDID) injection 0.5 mg     I have reviewed the patients home medicines and have made adjustments as needed  Problem List / ED Course: Problem List Items Addressed This Visit   None Visit Diagnoses     Ureterolithiasis    -  Primary   Relevant Medications   HYDROmorphone (DILAUDID)  injection 0.5 mg (Completed)   HYDROmorphone (DILAUDID) injection 0.5 mg (Completed)   HYDROmorphone (DILAUDID) injection 0.5 mg            {Document critical care time when appropriate:1} {Document review of labs and clinical decision tools ie heart score, Chads2Vasc2 etc:1}  {Document your independent review of radiology images, and any outside records:1} {Document your discussion with family members, caretakers, and with consultants:1} {Document social determinants of health affecting pt's care:1} {Document your decision making why or why not admission, treatments were needed:1}  This note was created using dictation software, which may contain spelling or grammatical errors.

## 2022-12-02 NOTE — ED Provider Notes (Signed)
[place] EMERGENCY DEPARTMENT AT [facility] Provider Note   CSN: [id number] Arrival date & time: 12/02/22  1724     History  Chief Complaint  Patient presents with   Flank Pain         Cynthia [name] is a 57 y.o. female with PMH as listed below who presents with bilateral flank pain and nausea.  She states that she has a history of the stones bilaterally requiring bilateral stent placement years ago.  She states that she is having flank pain radiating to right lower quadrant worsening over the last several days similar to prior kidney stones.  Saw a primary care physician today who ordered an outpatient CT scan which reportedly showed bilateral urolithiasis, right stone 8 mm and left stone 4 mm.  When asked if there was any obstruction patient states that she does not think there was.  She denies any fevers or urinary symptoms, vaginal or diarrheal symptoms.  She brings with her a CD of the CT scan taken earlier today.   Past Medical History:  Diagnosis Date   Allergy    Anxiety    Bladder disorder    Colitis 2006   occurred in presnce of SBO , no issues since    Colon polyps    Complication of anesthesia    problems waking up and PONV   Depression    Diverticulosis    Headache    Hyperlipemia    Hypothyroidism    Kidney stone    Paroxysmal ventricular tachycardia (HCC) 2015   Pericarditis 2015   PONV (postoperative nausea and vomiting)    Rapid heart rate    maybe once a month since pericarditis   Rosacea    SBO (small bowel obstruction) (HCC) 08/16/2015   Vitamin D deficiency        Home Medications Prior to Admission medications   Medication Sig Start Date End Date Taking? Authorizing Provider  acetaminophen (TYLENOL) 500 MG tablet Take 1,000 mg by mouth daily as needed for moderate pain or headache.    [provider]  ALPRAZolam Prudy Feeler) 1 MG tablet Take 0.5 mg by mouth 3 (three) times daily as needed for anxiety or sleep.    [provider]  aspirin 81 MG chewable tablet Chew 81 mg by mouth daily.    [provider]  Cholecalciferol (VITAMIN D) 50 MCG (2000 UT) tablet Take 2,000 Units by mouth daily.    [provider]  DAYVIGO 10 MG TABS Take 1 tablet by mouth at bedtime.    [provider]  diclofenac (VOLTAREN) 75 MG EC tablet Take 75 mg by mouth 2 (two) times daily.    [provider]  Doxepin HCl 6 MG TABS Take 6 mg by mouth at bedtime as needed for sleep. 09/11/20   [provider]  doxycycline (VIBRAMYCIN) 100 MG capsule Take 100 mg by mouth 2 (two) times daily. 08/04/22   [provider]  estrogens-methylTEST (ESTRATEST) 1.25-2.5 MG tablet Take 1 tablet by mouth at bedtime.  03/30/17   [provider]  lamoTRIgine (LAMICTAL) 200 MG tablet Take 400 mg by mouth at bedtime.    [provider]  Levothyroxine Sodium 88 MCG CAPS Take 88 mcg by mouth at bedtime.    [provider]  metoprolol succinate (TOPROL-XL) 50 MG 24 hr tablet Take 50 mg by mouth daily.    [provider]  NURTEC 75 MG TBDP Take 1 tablet by mouth daily as  needed. 07/23/22   [provider]  OZEMPIC, 2 MG/DOSE, 8 MG/3ML SOPN INJECT 2 MG SUBCUTANEOUSLY EVERY WEEK FOR DIABETES. 02/08/21   [provider]  RHOFADE 1 % CREA Apply 1 Application topically every morning.    [provider]  rosuvastatin (CRESTOR) 40 MG tablet Take 40 mg by mouth daily. Take 40 mg on Sun, Mon, Wed, Fri, and Sat 04/12/17   [provider]  SPRIX 15.75 MG/SPRAY SOLN Place into the nose. 11/12/21   [provider]  tamsulosin (FLOMAX) 0.4 MG CAPS capsule Take 0.4 mg by mouth at bedtime. 02/15/21   [provider]      Allergies    Prednisone, Amoxicillin, Ampicillin, Apple fruit extract, Apple juice, Cherry extract, Fruit & vegetable daily [nutritional supplements], Imitrex [sumatriptan], Nutritional supplements, Other, Peach [prunus  persica], Strawberry extract, and Zoloft [sertraline hcl]    Review of Systems   Review of Systems A 10 point review of systems was performed and is negative unless otherwise reported in HPI.  Physical Exam Updated Vital Signs BP 125/71   Pulse 78   Temp 98.7 F (37.1 C)   Resp 16   Ht 5\' 7"  (1.702 m)   Wt 99.3 kg   SpO2 99%   BMI 34.30 kg/m  Physical Exam General: Uncomfortable appearing female, lying in bed.  HEENT: PERRLA, Sclera anicteric, MMM, trachea midline.  Cardiology: RRR, no murmurs/rubs/gallops.  Resp: Normal respiratory rate and effort. CTAB, no wheezes, rhonchi, crackles.  Abd: Right lower quadrant tenderness to palpation.  Soft,  non-distended. No rebound tenderness or guarding.  GU: Deferred. MSK: No peripheral edema or signs of trauma.  Skin: warm, dry. No rashes or lesions. Back: +BL CVA TTP.  Neuro: A&Ox4, CNs II-XII grossly intact. MAEs. Sensation grossly intact.  Psych: Normal mood and affect.   ED Results / Procedures / Treatments   Labs (all labs ordered are listed, but only abnormal results are displayed) Labs Reviewed  CBC WITH DIFFERENTIAL/PLATELET - Abnormal; Notable for the following components:      Result Value   RBC 5.29 (*)    Hemoglobin 16.2 (*)    HCT 48.7 (*)    All other components within normal limits  COMPREHENSIVE METABOLIC PANEL - Abnormal; Notable for the following components:   Sodium 133 (*)    Potassium 3.4 (*)    Creatinine, Ser 1.75 (*)    GFR, Estimated 34 (*)    All other components within normal limits  URINALYSIS, ROUTINE W REFLEX MICROSCOPIC - Abnormal; Notable for the following components:   Color, Urine STRAW (*)    Specific Gravity, Urine 1.003 (*)    Hgb urine dipstick SMALL (*)    Protein, ur 30 (*)    Bacteria, UA FEW (*)    All other components within normal limits  LIPASE, BLOOD  HCG, QUANTITATIVE, PREGNANCY    EKG None  Radiology No results found.  Procedures Procedures    Medications  Ordered in ED Medications  HYDROmorphone (DILAUDID) injection 0.5 mg (has no administration in time range)  ondansetron (ZOFRAN) injection 4 mg (4 mg Intravenous Given 12/02/22 2019)  HYDROmorphone (DILAUDID) injection 0.5 mg (0.5 mg Intravenous Given 12/02/22 2020)  HYDROmorphone (DILAUDID) injection 0.5 mg (0.5 mg Intravenous Given 12/02/22 2205)  sodium chloride 0.9 % bolus 1,000 mL (1,000 mLs Intravenous New Bag/Given 12/02/22 2314)    ED Course/ Medical Decision Making/ A&P  Medical Decision Making Amount and/or Complexity of Data Reviewed Labs: ordered. Decision-making details documented in ED Course.  Risk Prescription drug management. Decision regarding hospitalization.    This patient presents to the ED for concern of urolithiasis, this involves an extensive number of treatment options, and is a complaint that carries with it a high risk of complications and morbidity.  I considered the following differential and admission for this acute, potentially life threatening condition.   MDM:    Patient with reported bilateral ureteral lithiasis on an outpatient CT scan earlier today.  Unfortunately I inquired with radiology who states there is no one who can upload the images into the patient's chart right now.  However with the CD you can look at the images on the computer, possibly there will be somebody tomorrow who can uploaded into the chart.  I for the time being gave the CD back to the patient.  Patient with bilateral ureteral lithiasis with history of the same requiring remote stent placement.  Consider pyelonephritis or septic stone.  Patient does not report any hydronephrosis or obstructive uropathy noted on the scan but cannot be sure.  She has been AKI demonstrated today which is up from her prior reported 1.2.  She is in severe pain requiring IV analgesia.  Discussed with urology and he request that she remain n.p.o. and will likely perform bilateral  stents in the morning.  Clinical Course as of 12/02/22 2348  Tue Dec 02, 2022  1921 D/w radiology. No one here now who can upload the CD with CT on it. [HN]  2205 Urinalysis, Routine w reflex microscopic -Urine, Clean Catch(!) No UTI [HN]  2206 Comprehensive metabolic panel(!) +AKI [HN]  2206 Hemoglobin(!): 16.2 Likely volume contraction [HN]  2206 Reevaluated patient who is still having significant pain. Will consult urology to [facility] and to hospitalist for admission. [HN]    Clinical Course User Index [HN] Loetta Rough, MD    Labs: I Ordered, and personally interpreted labs.  The pertinent results include: Those listed above  Additional history obtained from chart review.   Reevaluation: After the interventions noted above, I reevaluated the patient and found that they have :improved  Social Determinants of Health:  lives independently  Disposition: Admit to medicine with urology following  Co morbidities that complicate the patient evaluation  Past Medical History:  Diagnosis Date   Allergy    Anxiety    Bladder disorder    Colitis 2006   occurred in presnce of SBO , no issues since    Colon polyps    Complication of anesthesia    problems waking up and PONV   Depression    Diverticulosis    Headache    Hyperlipemia    Hypothyroidism    Kidney stone    Paroxysmal ventricular tachycardia (HCC) 2015   Pericarditis 2015   PONV (postoperative nausea and vomiting)    Rapid heart rate    maybe once a month since pericarditis   Rosacea    SBO (small bowel obstruction) (HCC) 08/16/2015   Vitamin D deficiency      Medicines Meds ordered this encounter  Medications   ondansetron (ZOFRAN) injection 4 mg   HYDROmorphone (DILAUDID) injection 0.5 mg   HYDROmorphone (DILAUDID) injection 0.5 mg   sodium chloride 0.9 % bolus 1,000 mL   HYDROmorphone (DILAUDID) injection 0.5 mg    I have reviewed the patients home medicines and have made adjustments as  needed  Problem List / ED Course:  Problem List Items Addressed This Visit   None Visit Diagnoses     Ureterolithiasis    -  Primary   Relevant Medications   HYDROmorphone (DILAUDID) injection 0.5 mg (Completed)   HYDROmorphone (DILAUDID) injection 0.5 mg (Completed)   HYDROmorphone (DILAUDID) injection 0.5 mg                   This note was created using dictation software, which may contain spelling or grammatical errors.    Loetta Rough, MD 12/05/22 [phone]

## 2022-12-02 NOTE — ED Notes (Signed)
ED TO INPATIENT HANDOFF REPORT  ED Nurse Name and Phone #: Jacqulyn Liner EMTP  S Name/Age/Gender Cynthia [name] 57 y.o. female Room/Bed: WHALA/WHALA  Code Status   Code Status: Prior  Home/SNF/Other Home Patient oriented to: self, place, time, and situation Is this baseline? Yes   Triage Complete: Triage complete  Chief Complaint Kidney stone [N20.0]  Triage Note Pt complaining of bilateral flank pain, pt had a CT and showed bilateral kidney stones. Referred to the ER due for pain control.   Allergies Allergies  Allergen Reactions   Prednisone Other (See Comments)    Eye Irritation  Other Reaction(s): insomnia, Other (See Comments)   Amoxicillin Other (See Comments)    Face, eyes, chest turned red.  Has tolerated Ancef.     Ampicillin Other (See Comments)    SERUM SICKNESS REACTION Has patient had a PCN reaction causing immediate rash, facial/tongue/throat swelling, SOB or lightheadedness with hypotension: Yes Has patient had a PCN reaction causing severe rash involving mucus membranes or skin necrosis: No Has patient had a PCN reaction that required hospitalization: No Has patient had a PCN reaction occurring within the last 10 years: No If all of the above answers are "NO", then may proceed with Cephalosporin use..  TOLERATED ANCEF.    Apple Fruit Extract Itching    Mouth itches inside, but no breathing impairment; treats with Benadryl   Apple Juice Itching    Mouth itches inside, but no breathing impairment; treats with Benadryl   Cherry Extract Itching    Mouth itches inside, but no breathing impairment; treats with Benadryl   Fruit & Vegetable Daily [Nutritional Supplements] Itching    FRUITS - Mouth itches inside, but no breathing impairment; treats with Benadryl   Imitrex [Sumatriptan] Other (See Comments)    Chest Pain   Nutritional Supplements Itching    FRUITS - Mouth itches inside, but no breathing impairment; treats with Benadryl   Other Itching     Feline dander = Mouth itches inside, but no breathing impairment; treats with Benadryl Cat dander    Peach [Prunus Persica] Itching    Mouth itches inside, but no breathing impairment; treats with Benadryl   Strawberry Extract Itching    Mouth itches inside, but no breathing impairment; treats with Benadryl   Zoloft [Sertraline Hcl] Other (See Comments)    Causes hyperactivity    Level of Care/Admitting Diagnosis ED Disposition     ED Disposition  Admit   Condition  --   Comment  Hospital Area: [facility] [[id number]]  Level of Care: Telemetry [5]  Admit to tele based on following criteria: Other see comments  Comments: aki  May admit patient to [facility] or [facility] if equivalent level of care is available:: No  Covid Evaluation: Asymptomatic - no recent exposure (last 10 days) testing not required  Diagnosis: Kidney stone [130865]  Admitting Physician: Therisa Doyne [[id number]]  Attending Physician: Therisa Doyne [[id number]]  Certification:: I certify this patient will need inpatient services for at least 2 midnights  Expected Medical Readiness: 12/04/2022          B Medical/Surgery History Past Medical History:  Diagnosis Date   Allergy    Anxiety    Bladder disorder    Colitis 2006   occurred in presnce of SBO , no issues since    Colon polyps    Complication of anesthesia    problems waking up and PONV   Depression    Diverticulosis  Headache    Hyperlipemia    Hypothyroidism    Kidney stone    Paroxysmal ventricular tachycardia (HCC) 2015   Pericarditis 2015   PONV (postoperative nausea and vomiting)    Rapid heart rate    maybe once a month since pericarditis   Rosacea    SBO (small bowel obstruction) (HCC) 08/16/2015   Vitamin D deficiency    Past Surgical History:  Procedure Laterality Date   ACHILLES TENDON SURGERY Left    APPENDECTOMY     CARDIAC CATHETERIZATION  11/15/2008   SMOOTH AND NORMAL   CHOLECYSTECTOMY      COLONOSCOPY     LAPAROSCOPIC ASSISTED VENTRAL HERNIA REPAIR N/A 08/14/2017   Procedure: LAPAROSCOPIC ASSISTED VENTRAL HERNIA REPAIR ERAS PATHWAY, CECOPEXY;  Surgeon: Luretha Murphy, MD;  Location: [facility];  Service: General;  Laterality: N/A;   LAPAROSCOPY N/A 10/26/2015   Procedure: diagnostic LAPAROSCOPY LAPAROTOMY AND APPENDECTOMY;  Surgeon: Luretha Murphy, MD;  Location: [facility];  Service: General;  Laterality: N/A;   PATELLA-FEMORAL ARTHROPLASTY Right 10/03/2020   Procedure: PATELLA-FEMORAL ARTHROPLASTY;  Surgeon: Eugenia Mcalpine, MD;  Location: [facility];  Service: Orthopedics;  Laterality: Right;  adductor canal block   POLYPECTOMY  02/2009   SHOULDER SURGERY  2010   left   TOTAL ABDOMINAL HYSTERECTOMY W/ BILATERAL SALPINGOOPHORECTOMY  2005   UPPER GASTROINTESTINAL ENDOSCOPY       A IV Location/Drains/Wounds Patient Lines/Drains/Airways Status     Active Line/Drains/Airways     Name Placement date Placement time Site Days   Peripheral IV 12/02/22 Right Antecubital 12/02/22  2019  Antecubital  less than 1   Incision - 3 Ports Abdomen Right;Lower Umbilicus Left;Upper 10/26/15  0810  -- 2594   Incision - 3 Ports Abdomen Right;Mid Medial;Lower Left;Upper 08/14/17  0820  -- 1936   Wound / Incision (Open or Dehisced) 11/06/15 Other (Comment) Arm Left;Distal skin tears from blisters from iv infiltration previous admission 11/06/15  2200  Arm  2583            Intake/Output Last 24 hours No intake or output data in the 24 hours ending 12/02/22 2322  Labs/Imaging Results for orders placed or performed during the hospital encounter of 12/02/22 (from the past 48 hour(s))  Urinalysis, Routine w reflex microscopic -Urine, Clean Catch     Status: Abnormal   Collection Time: 12/02/22  7:48 PM  Result Value Ref Range   Color, Urine STRAW (A) YELLOW   APPearance CLEAR CLEAR   Specific Gravity, Urine 1.003 (L) 1.005 - 1.030   pH 7.0 5.0 - 8.0   Glucose, UA NEGATIVE NEGATIVE  mg/dL   Hgb urine dipstick SMALL (A) NEGATIVE   Bilirubin Urine NEGATIVE NEGATIVE   Ketones, ur NEGATIVE NEGATIVE mg/dL   Protein, ur 30 (A) NEGATIVE mg/dL   Nitrite NEGATIVE NEGATIVE   Leukocytes,Ua NEGATIVE NEGATIVE   RBC / HPF 0-5 0 - 5 RBC/hpf   WBC, UA 0-5 0 - 5 WBC/hpf   Bacteria, UA FEW (A) NONE SEEN   Squamous Epithelial / HPF 0-5 0 - 5 /HPF    Comment: Performed at [facility], [address] [place], [place] [address]  CBC with Differential     Status: Abnormal   Collection Time: 12/02/22  8:20 PM  Result Value Ref Range   WBC 9.2 4.0 - 10.5 K/uL   RBC 5.29 (H) 3.87 - 5.11 MIL/uL   Hemoglobin 16.2 (H) 12.0 - 15.0 g/dL   HCT 66.4 (H) 40.3 - 47.4 %  MCV 92.1 80.0 - 100.0 fL   MCH 30.6 26.0 - 34.0 pg   MCHC 33.3 30.0 - 36.0 g/dL   RDW 65.7 84.6 - 96.2 %   Platelets 219 150 - 400 K/uL   nRBC 0.0 0.0 - 0.2 %   Neutrophils Relative % 70 %   Neutro Abs 6.5 1.7 - 7.7 K/uL   Lymphocytes Relative 19 %   Lymphs Abs 1.8 0.7 - 4.0 K/uL   Monocytes Relative 9 %   Monocytes Absolute 0.8 0.1 - 1.0 K/uL   Eosinophils Relative 1 %   Eosinophils Absolute 0.1 0.0 - 0.5 K/uL   Basophils Relative 1 %   Basophils Absolute 0.1 0.0 - 0.1 K/uL   Immature Granulocytes 0 %   Abs Immature Granulocytes 0.02 0.00 - 0.07 K/uL    Comment: Performed at [facility], [address] [place], [place] [address]  Comprehensive metabolic panel     Status: Abnormal   Collection Time: 12/02/22  8:20 PM  Result Value Ref Range   Sodium 133 (L) 135 - 145 mmol/L   Potassium 3.4 (L) 3.5 - 5.1 mmol/L   Chloride 99 98 - 111 mmol/L   CO2 27 22 - 32 mmol/L   Glucose, Bld 85 70 - 99 mg/dL    Comment: Glucose reference range applies only to samples taken after fasting for at least 8 hours.   BUN 10 6 - 20 mg/dL   Creatinine, Ser 1.32 (H) 0.44 - 1.00 mg/dL   Calcium 9.1 8.9 - 44.0 mg/dL   Total Protein 7.7 6.5 - 8.1 g/dL   Albumin 4.2 3.5 - 5.0 g/dL   AST 27 15 - 41  U/L   ALT 14 0 - 44 U/L   Alkaline Phosphatase 73 38 - 126 U/L   Total Bilirubin 0.6 0.3 - 1.2 mg/dL   GFR, Estimated 34 (L) >60 mL/min    Comment: (NOTE) Calculated using the CKD-EPI Creatinine Equation (2021)    Anion gap 7 5 - 15    Comment: Performed at [facility], [address] [place], [place] [address]  Lipase, blood     Status: None   Collection Time: 12/02/22  8:20 PM  Result Value Ref Range   Lipase 48 11 - 51 U/L    Comment: Performed at [facility], [address] [place], [place] [address]  hCG, quantitative, pregnancy     Status: None   Collection Time: 12/02/22  8:24 PM  Result Value Ref Range   hCG, Beta Chain, Quant, S <1 <5 mIU/mL    Comment:          GEST. AGE      CONC.  (mIU/mL)   <=1 WEEK        5 - 50     2 WEEKS       50 - 500     3 WEEKS       100 - 10,000     4 WEEKS     1,000 - 30,000     5 WEEKS     3,500 - 115,000   6-8 WEEKS     12,000 - 270,000    12 WEEKS     15,000 - 220,000        FEMALE AND NON-PREGNANT FEMALE:     LESS THAN 5 mIU/mL Performed at [facility], [address], [place], [place] [address]    No results found.  Pending  Labs [employer] (From admission, onward)    None       Vitals/Pain Today's Vitals   12/02/22 1757 12/02/22 2102 12/02/22 2114 12/02/22 2229  BP:   125/71   Pulse:   78   Resp:   16   Temp:   98.7 F (37.1 C)   TempSrc:      SpO2:   99%   Weight:      Height:      PainSc: 9  5   6      Isolation Precautions No active isolations  Medications Medications  HYDROmorphone (DILAUDID) injection 0.5 mg (has no administration in time range)  ondansetron (ZOFRAN) injection 4 mg (4 mg Intravenous Given 12/02/22 2019)  HYDROmorphone (DILAUDID) injection 0.5 mg (0.5 mg Intravenous Given 12/02/22 2020)  HYDROmorphone (DILAUDID) injection 0.5 mg (0.5 mg Intravenous Given 12/02/22 2205)  sodium chloride 0.9 % bolus 1,000 mL (1,000 mLs  Intravenous New Bag/Given 12/02/22 2314)    Mobility walks     Focused Assessments Patient is alert and oriented x4 at baseline. Patient came in for flank pain and CT showed bilateral kidney stones. Pain has been controlled with meds some. Patient only complains of flank pain at this time.    R Recommendations: See Admitting Provider Note  Report given to:   Additional Notes:

## 2022-12-02 NOTE — ED Notes (Signed)
Nurse advised she is not on that side yet. Will advise when she has moved to that section and got situated with her other patients.

## 2022-12-02 NOTE — Subjective & Objective (Addendum)
Patient complaining of bilateral flank pain CT scan done showing bilateral kidney stones

## 2022-12-03 ENCOUNTER — Inpatient Hospital Stay (HOSPITAL_COMMUNITY): Payer: Medicare Other

## 2022-12-03 DIAGNOSIS — N202 Calculus of kidney with calculus of ureter: Secondary | ICD-10-CM | POA: Diagnosis not present

## 2022-12-03 DIAGNOSIS — E876 Hypokalemia: Secondary | ICD-10-CM | POA: Diagnosis present

## 2022-12-03 DIAGNOSIS — N179 Acute kidney failure, unspecified: Secondary | ICD-10-CM | POA: Diagnosis present

## 2022-12-03 DIAGNOSIS — B952 Enterococcus as the cause of diseases classified elsewhere: Secondary | ICD-10-CM | POA: Diagnosis not present

## 2022-12-03 DIAGNOSIS — E039 Hypothyroidism, unspecified: Secondary | ICD-10-CM | POA: Diagnosis not present

## 2022-12-03 DIAGNOSIS — R1310 Dysphagia, unspecified: Secondary | ICD-10-CM | POA: Diagnosis not present

## 2022-12-03 DIAGNOSIS — N200 Calculus of kidney: Secondary | ICD-10-CM | POA: Diagnosis not present

## 2022-12-03 LAB — COMPREHENSIVE METABOLIC PANEL
ALT: 14 U/L (ref 0–44)
AST: 27 U/L (ref 15–41)
Albumin: 4 g/dL (ref 3.5–5.0)
Alkaline Phosphatase: 74 U/L (ref 38–126)
Anion gap: 11 (ref 5–15)
BUN: 9 mg/dL (ref 6–20)
CO2: 24 mmol/L (ref 22–32)
Calcium: 8.9 mg/dL (ref 8.9–10.3)
Chloride: 103 mmol/L (ref 98–111)
Creatinine, Ser: 1.46 mg/dL — ABNORMAL HIGH (ref 0.44–1.00)
GFR, Estimated: 42 mL/min — ABNORMAL LOW (ref >60)
Glucose, Bld: 93 mg/dL (ref 70–99)
Potassium: 3 mmol/L — ABNORMAL LOW (ref 3.5–5.1)
Sodium: 138 mmol/L (ref 135–145)
Total Bilirubin: 0.7 mg/dL (ref 0.3–1.2)
Total Protein: 7.4 g/dL (ref 6.5–8.1)

## 2022-12-03 LAB — OSMOLALITY, URINE: Osmolality, Ur: 135 mosm/kg — ABNORMAL LOW (ref 300–900)

## 2022-12-03 LAB — HIV ANTIBODY (ROUTINE TESTING W REFLEX): HIV Screen 4th Generation wRfx: NONREACTIVE

## 2022-12-03 LAB — MAGNESIUM: Magnesium: 2 mg/dL (ref 1.7–2.4)

## 2022-12-03 LAB — OSMOLALITY: Osmolality: 296 mosm/kg — ABNORMAL HIGH (ref 275–295)

## 2022-12-03 LAB — CBC
HCT: 45.7 % (ref 36.0–46.0)
Hemoglobin: 14.9 g/dL (ref 12.0–15.0)
MCH: 30.3 pg (ref 26.0–34.0)
MCHC: 32.6 g/dL (ref 30.0–36.0)
MCV: 92.9 fL (ref 80.0–100.0)
Platelets: 209 10*3/uL (ref 150–400)
RBC: 4.92 MIL/uL (ref 3.87–5.11)
RDW: 15.2 % (ref 11.5–15.5)
WBC: 11.2 10*3/uL — ABNORMAL HIGH (ref 4.0–10.5)
nRBC: 0 % (ref 0.0–0.2)

## 2022-12-03 LAB — TSH: TSH: 2.112 u[IU]/mL (ref 0.350–4.500)

## 2022-12-03 LAB — GLUCOSE, CAPILLARY: Glucose-Capillary: 90 mg/dL (ref 70–99)

## 2022-12-03 LAB — PHOSPHORUS: Phosphorus: 1.8 mg/dL — ABNORMAL LOW (ref 2.5–4.6)

## 2022-12-03 LAB — SODIUM, URINE, RANDOM: Sodium, Ur: 27 mmol/L

## 2022-12-03 LAB — CK: Total CK: 78 U/L (ref 38–234)

## 2022-12-03 LAB — CREATININE, URINE, RANDOM: Creatinine, Urine: 32 mg/dL

## 2022-12-03 LAB — PREALBUMIN: Prealbumin: 30 mg/dL (ref 18–38)

## 2022-12-03 MED ORDER — PANTOPRAZOLE SODIUM 40 MG IV SOLR: 40.0000 mg | Freq: Two times a day (BID) | INTRAVENOUS | Status: DC

## 2022-12-03 MED ORDER — ONDANSETRON HCL 4 MG PO TABS: 4.0000 mg | ORAL_TABLET | Freq: Four times a day (QID) | ORAL | Status: DC | PRN

## 2022-12-03 MED ORDER — POTASSIUM CHLORIDE 10 MEQ/100ML IV SOLN
10.0000 meq | INTRAVENOUS | Status: AC
  Administered 2022-12-03 (×2): 10 meq via INTRAVENOUS
  Filled 2022-12-03 (×2): qty 100

## 2022-12-03 MED ORDER — POLYETHYLENE GLYCOL 3350 17 G PO PACK
17.0000 g | PACK | Freq: Every day | ORAL | Status: DC
  Administered 2022-12-04 – 2022-12-05 (×2): 17 g via ORAL
  Filled 2022-12-03 (×2): qty 1

## 2022-12-03 MED ORDER — SODIUM CHLORIDE 0.9 % IV SOLN: INTRAVENOUS | Status: AC

## 2022-12-03 MED ORDER — ROSUVASTATIN CALCIUM 20 MG PO TABS
40.0000 mg | ORAL_TABLET | Freq: Every day | ORAL | Status: DC
  Administered 2022-12-04 – 2022-12-05 (×2): 40 mg via ORAL
  Filled 2022-12-03 (×3): qty 2

## 2022-12-03 MED ORDER — LEVOTHYROXINE SODIUM 88 MCG PO TABS
88.0000 ug | ORAL_TABLET | Freq: Every day | ORAL | Status: DC
Start: 2022-12-03 — End: 2022-12-06
  Administered 2022-12-04 – 2022-12-05 (×2): 88 ug via ORAL
  Filled 2022-12-03 (×2): qty 1

## 2022-12-03 MED ORDER — HYDROMORPHONE HCL 1 MG/ML IJ SOLN
1.0000 mg | INTRAMUSCULAR | Status: DC | PRN
  Administered 2022-12-03 – 2022-12-05 (×7): 1 mg via INTRAVENOUS
  Filled 2022-12-03 (×7): qty 1

## 2022-12-03 MED ORDER — SODIUM CHLORIDE 0.9 % IV SOLN
INTRAVENOUS | Status: AC
Start: 2022-12-03 — End: 2022-12-03

## 2022-12-03 MED ORDER — HYDROMORPHONE HCL 1 MG/ML IJ SOLN
0.5000 mg | INTRAMUSCULAR | Status: DC | PRN
  Administered 2022-12-03 (×5): 1 mg via INTRAVENOUS
  Filled 2022-12-03 (×5): qty 1

## 2022-12-03 MED ORDER — ORAL CARE MOUTH RINSE: 15.0000 mL | OROMUCOSAL | Status: DC | PRN

## 2022-12-03 MED ORDER — BISACODYL 10 MG RE SUPP
10.0000 mg | Freq: Once | RECTAL | Status: AC
  Administered 2022-12-03: 10 mg via RECTAL
  Filled 2022-12-03: qty 1

## 2022-12-03 MED ORDER — METOCLOPRAMIDE HCL 5 MG/ML IJ SOLN
10.0000 mg | Freq: Once | INTRAMUSCULAR | Status: AC
  Administered 2022-12-03: 10 mg via INTRAVENOUS
  Filled 2022-12-03: qty 2

## 2022-12-03 MED ORDER — METHOCARBAMOL 1000 MG/10ML IJ SOLN: 500.0000 mg | Freq: Four times a day (QID) | INTRAMUSCULAR | Status: DC | PRN

## 2022-12-03 MED ORDER — BISACODYL 5 MG PO TBEC
10.0000 mg | DELAYED_RELEASE_TABLET | Freq: Every day | ORAL | Status: DC
Start: 2022-12-04 — End: 2022-12-06
  Administered 2022-12-04 – 2022-12-05 (×2): 10 mg via ORAL
  Filled 2022-12-03 (×2): qty 2

## 2022-12-03 MED ORDER — POTASSIUM CHLORIDE 10 MEQ/100ML IV SOLN
10.0000 meq | INTRAVENOUS | Status: AC
  Administered 2022-12-03 (×6): 10 meq via INTRAVENOUS
  Filled 2022-12-03 (×6): qty 100

## 2022-12-03 MED ORDER — SODIUM CHLORIDE 0.9 % IV SOLN
25.0000 mg | Freq: Four times a day (QID) | INTRAVENOUS | Status: DC | PRN
  Administered 2022-12-03 – 2022-12-04 (×5): 25 mg via INTRAVENOUS
  Filled 2022-12-03 (×2): qty 25
  Filled 2022-12-03: qty 1
  Filled 2022-12-03: qty 25
  Filled 2022-12-03 (×2): qty 1

## 2022-12-03 MED ORDER — ACETAMINOPHEN 650 MG RE SUPP: 650.0000 mg | Freq: Four times a day (QID) | RECTAL | Status: DC | PRN

## 2022-12-03 MED ORDER — ONDANSETRON HCL 4 MG/2ML IJ SOLN
4.0000 mg | Freq: Four times a day (QID) | INTRAMUSCULAR | Status: DC | PRN
  Administered 2022-12-03 (×2): 4 mg via INTRAVENOUS
  Filled 2022-12-03 (×2): qty 2

## 2022-12-03 MED ORDER — POTASSIUM PHOSPHATES 15 MMOLE/5ML IV SOLN
30.0000 mmol | Freq: Once | INTRAVENOUS | Status: AC
  Administered 2022-12-03: 30 mmol via INTRAVENOUS
  Filled 2022-12-03: qty 10

## 2022-12-03 MED ORDER — DICYCLOMINE HCL 10 MG PO CAPS
10.0000 mg | ORAL_CAPSULE | Freq: Three times a day (TID) | ORAL | Status: DC
  Administered 2022-12-04 – 2022-12-05 (×6): 10 mg via ORAL
  Filled 2022-12-03 (×6): qty 1

## 2022-12-03 MED ORDER — HYDROCODONE-ACETAMINOPHEN 5-325 MG PO TABS
1.0000 | ORAL_TABLET | ORAL | Status: DC | PRN
  Administered 2022-12-05 (×2): 1 via ORAL
  Filled 2022-12-03 (×2): qty 1

## 2022-12-03 MED ORDER — PANTOPRAZOLE SODIUM 40 MG IV SOLR
40.0000 mg | Freq: Two times a day (BID) | INTRAVENOUS | Status: DC
  Administered 2022-12-03 – 2022-12-06 (×6): 40 mg via INTRAVENOUS
  Filled 2022-12-03 (×6): qty 10

## 2022-12-03 MED ORDER — ACETAMINOPHEN 325 MG PO TABS: 650.0000 mg | ORAL_TABLET | Freq: Four times a day (QID) | ORAL | Status: DC | PRN

## 2022-12-03 MED ORDER — TAMSULOSIN HCL 0.4 MG PO CAPS
0.4000 mg | ORAL_CAPSULE | Freq: Every day | ORAL | Status: DC
  Administered 2022-12-04 – 2022-12-05 (×2): 0.4 mg via ORAL
  Filled 2022-12-03 (×2): qty 1

## 2022-12-03 MED ORDER — SENNOSIDES-DOCUSATE SODIUM 8.6-50 MG PO TABS
3.0000 | ORAL_TABLET | Freq: Every day | ORAL | Status: DC
  Administered 2022-12-04: 3 via ORAL
  Filled 2022-12-03 (×2): qty 3

## 2022-12-03 NOTE — Assessment & Plan Note (Signed)
-   will replace electrolytes and repeat  check Mg, phos and Ca level and replace as needed Monitor on telemetry   Lab Results  Component Value Date   K 3.4 (L) 12/02/2022     Lab Results  Component Value Date   CREATININE 1.75 (H) 12/02/2022   Lab Results  Component Value Date   MG 1.9 08/16/2015   Lab Results  Component Value Date   CALCIUM 9.1 12/02/2022   PHOS 2.9 08/16/2015

## 2022-12-03 NOTE — Assessment & Plan Note (Signed)
Chronic stable follow up as an out pt

## 2022-12-03 NOTE — Assessment & Plan Note (Signed)
Obtain your electrolytes likely in the setting of dehydration.  Renal stones nonobstructing with urology is aware continue Flomax and I have aggressive fluid resuscitation

## 2022-12-03 NOTE — Progress Notes (Signed)
PROGRESS NOTE    Cynthia [name]  JXB:[id number] DOB: 1965/06/25 DOA: 12/02/2022 PCP: Kaleen Mask, MD   Brief Narrative:  57 year old female with history of kidney stones admitted with bilateral flank pain and nausea and vomiting.  History of kidney stones and bilateral ureteral stent placements in the past.  CT on admission shows bilateral kidney stones which are nonobstructive.  Her creatinine is elevated 1.75 on admission.  She was seen by Dr. Alvester Morin. She denied fever or chills dysuria hematuria.  She denied chest pain shortness of breath cough fever. She complains of being dizzy  She was being treated as an outpatient by her PCP with Toradol and Flomax with no improvement.  Other past medical history includes hyperlipidemia hypothyroidism and obesity. 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 Daphine Deutscher CT abdomen with moderate stool with colonic diverticulosis, bilateral nonobstructive renal calculi  Assessment & Plan:   Principal Problem:   Kidney stone Active Problems:   Hyperlipemia   Hypothyroidism   Obesity   AKI (acute kidney injury) (HCC)   Hypokalemia   #1 bilateral flank pain CT done outside hospital done 10/29 shows 8 mm stone in the right and stone in the left nonobstructing.  Seen by urology no intervention planned in hospital admission Will arrange for outpatient follow-up.    #2 nausea and vomiting not responding to Zofran Continue Phenergan and IV fluids On Lamictal dose is not increased recently she has been on the same dose for 10 years On Ozempic for 2 years Check KUB  #3 moderate amount of stool in the colon though she does not report constipation-will try Dulcolax MiraLAX and senna  #4 hypokalemia being repleted  #5 depression on Lamictal    Estimated body mass index is 34.3 kg/m as calculated from the following:   Height as of this encounter: 5\' 7"  (1.702 m).   Weight as of this encounter: 99.3 kg.  DVT prophylaxis: Lovenox  code Status: Full  code Family Communication: Husband at bedside  disposition Plan:  Status is: Inpatient Remains inpatient appropriate because: Intractable nausea vomiting   Consultants:  None discussed with general surgery over the phone  Procedures: None Antimicrobials: None  Subjective: Intractable nausea and vomiting not responding to Zofran has been on n.p.o.  Objective: Vitals:   12/02/22 2114 12/03/22 0045 12/03/22 0524 12/03/22 0849  BP: 125/71 123/72 119/70 114/69  Pulse: 78 90 84 82  Resp: 16 15 16 16   Temp: 98.7 F (37.1 C) 99.1 F (37.3 C) 98.7 F (37.1 C) 98.3 F (36.8 C)  TempSrc:  Oral Oral Oral  SpO2: 99% 92% 95% 96%  Weight:      Height:        Intake/Output Summary (Last 24 hours) at 12/03/2022 1157 Last data filed at 12/03/2022 0900 Gross per 24 hour  Intake 954.59 ml  Output 500 ml  Net 454.59 ml   Filed Weights   12/02/22 1740  Weight: 99.3 kg    Examination:  General exam: Appears in distress due to nausea and vomiting Respiratory system: Clear to auscultation. Respiratory effort normal. Cardiovascular system: S1 & S2 heard, RRR. No JVD, murmurs, rubs, gallops or clicks. No pedal edema. Gastrointestinal system: Abdomen is nondistended, soft and tender. No organomegaly or masses felt. Normal bowel sounds heard. Central nervous system: Alert and oriented. No focal neurological deficits. Extremities: Symmetric 5 x 5 power.  Data Reviewed: I have personally reviewed following labs and imaging studies  CBC: Recent Labs  Lab 12/02/22 2020 12/03/22 0441  WBC 9.2 11.2*  NEUTROABS 6.5  --   HGB 16.2* 14.9  HCT 48.7* 45.7  MCV 92.1 92.9  PLT 219 209   Basic Metabolic Panel: Recent Labs  Lab 12/02/22 2020 12/03/22 0441  NA 133* 138  K 3.4* 3.0*  CL 99 103  CO2 27 24  GLUCOSE 85 93  BUN 10 9  CREATININE 1.75* 1.46*  CALCIUM 9.1 8.9  MG  --  2.0  PHOS  --  1.8*   GFR: Estimated Creatinine Clearance: 52.7 mL/min (A) (by C-G formula based on SCr  of 1.46 mg/dL (H)). Liver Function Tests: Recent Labs  Lab 12/02/22 2020 12/03/22 0441  AST 27 27  ALT 14 14  ALKPHOS 73 74  BILITOT 0.6 0.7  PROT 7.7 7.4  ALBUMIN 4.2 4.0   Recent Labs  Lab 12/02/22 2020  LIPASE 48   No results for input(s): "AMMONIA" in the last 168 hours. Coagulation Profile: No results for input(s): "INR", "PROTIME" in the last 168 hours. Cardiac Enzymes: Recent Labs  Lab 12/03/22 0411  CKTOTAL 78   BNP (last 3 results) No results for input(s): "PROBNP" in the last 8760 hours. HbA1C: No results for input(s): "HGBA1C" in the last 72 hours. CBG: No results for input(s): "GLUCAP" in the last 168 hours. Lipid Profile: No results for input(s): "CHOL", "HDL", "LDLCALC", "TRIG", "CHOLHDL", "LDLDIRECT" in the last 72 hours. Thyroid Function Tests: Recent Labs    12/03/22 0441  TSH 2.112   Anemia Panel: No results for input(s): "VITAMINB12", "FOLATE", "FERRITIN", "TIBC", "IRON", "RETICCTPCT" in the last 72 hours. Sepsis Labs: No results for input(s): "PROCALCITON", "LATICACIDVEN" in the last 168 hours.  No results found for this or any previous visit (from the past 240 hour(s)).    Radiology Studies: No results found.   Scheduled Meds:  levothyroxine  88 mcg Oral QHS   rosuvastatin  40 mg Oral Daily   tamsulosin  0.4 mg Oral QPC supper   Continuous Infusions:  potassium chloride 10 mEq (12/03/22 1116)   potassium PHOSPHATE IVPB (in mmol) 30 mmol (12/03/22 1125)   promethazine (PHENERGAN) injection (IM or IVPB) 25 mg (12/03/22 0917)     LOS: 1 day    Time spent: 38 min Alwyn Ren, MD 12/03/2022, 11:57 AM

## 2022-12-03 NOTE — Assessment & Plan Note (Signed)
-   Check TSH continue home medications Synthroid at 88mcg po q day  

## 2022-12-03 NOTE — Assessment & Plan Note (Signed)
Renal CT daone today showing Right 8mm stone and left 4 mm stone Non obstrucitng Dr. Alvester Morin with urology is aware  Plan for npo post midnight  Pain control

## 2022-12-03 NOTE — Assessment & Plan Note (Signed)
Restart crestor when able to tolerate

## 2022-12-03 NOTE — Consult Note (Signed)
H&P Physician requesting consult: [place] Dom  Chief Complaint: Bilateral renal calculi, flank pain  History of Present Illness: 57 year old female with a history of nephrolithiasis that has required ureteroscopy in the past.  Required bilateral ureteral stent followed by ureteroscopy for bilateral obstructing ureteral stones.  Patient underwent CT scan at an outside facility for flank pain.  Patient was under the impression that this revealed bilateral ureteral calculi.  Imaging would not able to be uploaded last night and she was admitted to the hospital.  Creatinine was 1.75 yesterday and is 1.46 today.  White blood cell count 11.2.  I reviewed her imaging this morning as well as the imaging report from the outside hospital.  She has bilateral nonobstructing renal calculi.  She has no obstructing stones.  Her pain is in the lower back and radiates towards the groin.  She denies hematuria dysuria.  Urinalysis is not consistent with UTI.  Past Medical History:  Diagnosis Date   Allergy    Anxiety    Bladder disorder    Colitis 2006   occurred in presnce of SBO , no issues since    Colon polyps    Complication of anesthesia    problems waking up and PONV   Depression    Diverticulosis    Headache    Hyperlipemia    Hypothyroidism    Kidney stone    Paroxysmal ventricular tachycardia (HCC) 2015   Pericarditis 2015   PONV (postoperative nausea and vomiting)    Rapid heart rate    maybe once a month since pericarditis   Rosacea    SBO (small bowel obstruction) (HCC) 08/16/2015   Vitamin D deficiency    Past Surgical History:  Procedure Laterality Date   ACHILLES TENDON SURGERY Left    APPENDECTOMY     CARDIAC CATHETERIZATION  11/15/2008   SMOOTH AND NORMAL   CHOLECYSTECTOMY     COLONOSCOPY     LAPAROSCOPIC ASSISTED VENTRAL HERNIA REPAIR N/A 08/14/2017   Procedure: LAPAROSCOPIC ASSISTED VENTRAL HERNIA REPAIR ERAS PATHWAY, CECOPEXY;  Surgeon: Luretha Murphy, MD;   Location: [facility];  Service: General;  Laterality: N/A;   LAPAROSCOPY N/A 10/26/2015   Procedure: diagnostic LAPAROSCOPY LAPAROTOMY AND APPENDECTOMY;  Surgeon: Luretha Murphy, MD;  Location: [facility];  Service: General;  Laterality: N/A;   PATELLA-FEMORAL ARTHROPLASTY Right 10/03/2020   Procedure: PATELLA-FEMORAL ARTHROPLASTY;  Surgeon: Eugenia Mcalpine, MD;  Location: [facility];  Service: Orthopedics;  Laterality: Right;  adductor canal block   POLYPECTOMY  02/2009   SHOULDER SURGERY  2010   left   TOTAL ABDOMINAL HYSTERECTOMY W/ BILATERAL SALPINGOOPHORECTOMY  2005   UPPER GASTROINTESTINAL ENDOSCOPY      Home Medications:  Medications Prior to Admission  Medication Sig Dispense Refill Last Dose   acetaminophen (TYLENOL) 500 MG tablet Take 1,000 mg by mouth daily as needed for moderate pain or headache.      ALPRAZolam (XANAX) 1 MG tablet Take 0.5 mg by mouth 3 (three) times daily as needed for anxiety or sleep.      aspirin 81 MG chewable tablet Chew 81 mg by mouth daily.      Cholecalciferol (VITAMIN D) 50 MCG (2000 UT) tablet Take 2,000 Units by mouth daily.      DAYVIGO 10 MG TABS Take 1 tablet by mouth at bedtime.      diclofenac (VOLTAREN) 75 MG EC tablet Take 75 mg by mouth 2 (two) times daily.      Doxepin HCl 6 MG TABS Take 6  mg by mouth at bedtime as needed for sleep.      doxycycline (VIBRAMYCIN) 100 MG capsule Take 100 mg by mouth 2 (two) times daily.      estrogens-methylTEST (ESTRATEST) 1.25-2.5 MG tablet Take 1 tablet by mouth at bedtime.   5    lamoTRIgine (LAMICTAL) 200 MG tablet Take 400 mg by mouth at bedtime.      Levothyroxine Sodium 88 MCG CAPS Take 88 mcg by mouth at bedtime.      metoprolol succinate (TOPROL-XL) 50 MG 24 hr tablet Take 50 mg by mouth daily.      NURTEC 75 MG TBDP Take 1 tablet by mouth daily as needed.      OZEMPIC, 2 MG/DOSE, 8 MG/3ML SOPN INJECT 2 MG SUBCUTANEOUSLY EVERY WEEK FOR DIABETES.      RHOFADE 1 % CREA Apply 1 Application topically  every morning.      rosuvastatin (CRESTOR) 40 MG tablet Take 40 mg by mouth daily. Take 40 mg on Sun, Mon, Wed, Fri, and Sat  11    SPRIX 15.75 MG/SPRAY SOLN Place into the nose.      tamsulosin (FLOMAX) 0.4 MG CAPS capsule Take 0.4 mg by mouth at bedtime.      Allergies:  Allergies  Allergen Reactions   Prednisone Other (See Comments)    Eye Irritation  Other Reaction(s): insomnia, Other (See Comments)   Amoxicillin Other (See Comments)    Face, eyes, chest turned red.  Has tolerated Ancef.     Ampicillin Other (See Comments)    SERUM SICKNESS REACTION Has patient had a PCN reaction causing immediate rash, facial/tongue/throat swelling, SOB or lightheadedness with hypotension: Yes Has patient had a PCN reaction causing severe rash involving mucus membranes or skin necrosis: No Has patient had a PCN reaction that required hospitalization: No Has patient had a PCN reaction occurring within the last 10 years: No If all of the above answers are "NO", then may proceed with Cephalosporin use..  TOLERATED ANCEF.    Apple Fruit Extract Itching    Mouth itches inside, but no breathing impairment; treats with Benadryl   Apple Juice Itching    Mouth itches inside, but no breathing impairment; treats with Benadryl   Cherry Extract Itching    Mouth itches inside, but no breathing impairment; treats with Benadryl   Fruit & Vegetable Daily [Nutritional Supplements] Itching    FRUITS - Mouth itches inside, but no breathing impairment; treats with Benadryl   Imitrex [Sumatriptan] Other (See Comments)    Chest Pain   Nutritional Supplements Itching    FRUITS - Mouth itches inside, but no breathing impairment; treats with Benadryl   Other Itching    Feline dander = Mouth itches inside, but no breathing impairment; treats with Benadryl Cat dander    Peach [Prunus Persica] Itching    Mouth itches inside, but no breathing impairment; treats with Benadryl   Strawberry Extract Itching    Mouth  itches inside, but no breathing impairment; treats with Benadryl   Zoloft [Sertraline Hcl] Other (See Comments)    Causes hyperactivity    Family History  Adopted: Yes  Problem Relation Age of Onset   Breast cancer Neg Hx    Social History:  reports that she has never smoked. She has never used smokeless tobacco. She reports that she does not drink alcohol and does not use drugs.  ROS: A complete review of systems was performed.  All systems are negative except for pertinent findings as noted. ROS  Physical Exam:  Vital signs in last 24 hours: Temp:  [98.7 F (37.1 C)-99.1 F (37.3 C)] 98.7 F (37.1 C) (10/30 0524) Pulse Rate:  [78-90] 84 (10/30 0524) Resp:  [15-18] 16 (10/30 0524) BP: (119-137)/(70-74) 119/70 (10/30 0524) SpO2:  [92 %-99 %] 95 % (10/30 0524) Weight:  [99.3 kg] 99.3 kg (10/29 1740) General:  Alert and oriented, No acute distress HEENT: Normocephalic, atraumatic Neck: No JVD or lymphadenopathy Cardiovascular: Regular rate and rhythm Lungs: Regular rate and effort Abdomen: Soft, nontender, nondistended, no abdominal masses Back: No CVA tenderness Extremities: No edema Neurologic: Grossly intact  Laboratory Data:  Results for orders placed or performed during the hospital encounter of 12/02/22 (from the past 24 hour(s))  Urinalysis, Routine w reflex microscopic -Urine, Clean Catch     Status: Abnormal   Collection Time: 12/02/22  7:48 PM  Result Value Ref Range   Color, Urine STRAW (A) YELLOW   APPearance CLEAR CLEAR   Specific Gravity, Urine 1.003 (L) 1.005 - 1.030   pH 7.0 5.0 - 8.0   Glucose, UA NEGATIVE NEGATIVE mg/dL   Hgb urine dipstick SMALL (A) NEGATIVE   Bilirubin Urine NEGATIVE NEGATIVE   Ketones, ur NEGATIVE NEGATIVE mg/dL   Protein, ur 30 (A) NEGATIVE mg/dL   Nitrite NEGATIVE NEGATIVE   Leukocytes,Ua NEGATIVE NEGATIVE   RBC / HPF 0-5 0 - 5 RBC/hpf   WBC, UA 0-5 0 - 5 WBC/hpf   Bacteria, UA FEW (A) NONE SEEN   Squamous Epithelial /  HPF 0-5 0 - 5 /HPF  CBC with Differential     Status: Abnormal   Collection Time: 12/02/22  8:20 PM  Result Value Ref Range   WBC 9.2 4.0 - 10.5 K/uL   RBC 5.29 (H) 3.87 - 5.11 MIL/uL   Hemoglobin 16.2 (H) 12.0 - 15.0 g/dL   HCT 60.1 (H) 09.3 - 23.5 %   MCV 92.1 80.0 - 100.0 fL   MCH 30.6 26.0 - 34.0 pg   MCHC 33.3 30.0 - 36.0 g/dL   RDW 57.3 22.0 - 25.4 %   Platelets 219 150 - 400 K/uL   nRBC 0.0 0.0 - 0.2 %   Neutrophils Relative % 70 %   Neutro Abs 6.5 1.7 - 7.7 K/uL   Lymphocytes Relative 19 %   Lymphs Abs 1.8 0.7 - 4.0 K/uL   Monocytes Relative 9 %   Monocytes Absolute 0.8 0.1 - 1.0 K/uL   Eosinophils Relative 1 %   Eosinophils Absolute 0.1 0.0 - 0.5 K/uL   Basophils Relative 1 %   Basophils Absolute 0.1 0.0 - 0.1 K/uL   Immature Granulocytes 0 %   Abs Immature Granulocytes 0.02 0.00 - 0.07 K/uL  Comprehensive metabolic panel     Status: Abnormal   Collection Time: 12/02/22  8:20 PM  Result Value Ref Range   Sodium 133 (L) 135 - 145 mmol/L   Potassium 3.4 (L) 3.5 - 5.1 mmol/L   Chloride 99 98 - 111 mmol/L   CO2 27 22 - 32 mmol/L   Glucose, Bld 85 70 - 99 mg/dL   BUN 10 6 - 20 mg/dL   Creatinine, Ser 2.70 (H) 0.44 - 1.00 mg/dL   Calcium 9.1 8.9 - 62.3 mg/dL   Total Protein 7.7 6.5 - 8.1 g/dL   Albumin 4.2 3.5 - 5.0 g/dL   AST 27 15 - 41 U/L   ALT 14 0 - 44 U/L   Alkaline Phosphatase 73 38 - 126 U/L   Total  Bilirubin 0.6 0.3 - 1.2 mg/dL   GFR, Estimated 34 (L) >60 mL/min   Anion gap 7 5 - 15  Lipase, blood     Status: None   Collection Time: 12/02/22  8:20 PM  Result Value Ref Range   Lipase 48 11 - 51 U/L  hCG, quantitative, pregnancy     Status: None   Collection Time: 12/02/22  8:24 PM  Result Value Ref Range   hCG, Beta Chain, Quant, S <1 <5 mIU/mL  CK     Status: None   Collection Time: 12/03/22  4:11 AM  Result Value Ref Range   Total CK 78 38 - 234 U/L  Osmolality     Status: Abnormal   Collection Time: 12/03/22  4:41 AM  Result Value Ref Range    Osmolality 296 (H) 275 - 295 mOsm/kg  Prealbumin     Status: None   Collection Time: 12/03/22  4:41 AM  Result Value Ref Range   Prealbumin 30 18 - 38 mg/dL  TSH     Status: None   Collection Time: 12/03/22  4:41 AM  Result Value Ref Range   TSH 2.112 0.350 - 4.500 uIU/mL  Magnesium     Status: None   Collection Time: 12/03/22  4:41 AM  Result Value Ref Range   Magnesium 2.0 1.7 - 2.4 mg/dL  Phosphorus     Status: Abnormal   Collection Time: 12/03/22  4:41 AM  Result Value Ref Range   Phosphorus 1.8 (L) 2.5 - 4.6 mg/dL  Comprehensive metabolic panel     Status: Abnormal   Collection Time: 12/03/22  4:41 AM  Result Value Ref Range   Sodium 138 135 - 145 mmol/L   Potassium 3.0 (L) 3.5 - 5.1 mmol/L   Chloride 103 98 - 111 mmol/L   CO2 24 22 - 32 mmol/L   Glucose, Bld 93 70 - 99 mg/dL   BUN 9 6 - 20 mg/dL   Creatinine, Ser 6.29 (H) 0.44 - 1.00 mg/dL   Calcium 8.9 8.9 - 52.8 mg/dL   Total Protein 7.4 6.5 - 8.1 g/dL   Albumin 4.0 3.5 - 5.0 g/dL   AST 27 15 - 41 U/L   ALT 14 0 - 44 U/L   Alkaline Phosphatase 74 38 - 126 U/L   Total Bilirubin 0.7 0.3 - 1.2 mg/dL   GFR, Estimated 42 (L) >60 mL/min   Anion gap 11 5 - 15  CBC     Status: Abnormal   Collection Time: 12/03/22  4:41 AM  Result Value Ref Range   WBC 11.2 (H) 4.0 - 10.5 K/uL   RBC 4.92 3.87 - 5.11 MIL/uL   Hemoglobin 14.9 12.0 - 15.0 g/dL   HCT 41.3 24.4 - 01.0 %   MCV 92.9 80.0 - 100.0 fL   MCH 30.3 26.0 - 34.0 pg   MCHC 32.6 30.0 - 36.0 g/dL   RDW 27.2 53.6 - 64.4 %   Platelets 209 150 - 400 K/uL   nRBC 0.0 0.0 - 0.2 %   No results found for this or any previous visit (from the past 240 hour(s)). Creatinine: Recent Labs    12/02/22 2020 12/03/22 0441  CREATININE 1.75* 1.46*    Impression/Assessment:  Bilateral renal calculi Flank pain  Plan:  Patient does not have obstructing stones in the ureter.  Stones are nonobstructing and in the kidney.  Unlikely to be causing her pain.  Do not recommend any  urgent intervention for the  stones.  She may follow-up outpatient with a [facility].  Ray Church, III 12/03/2022, 8:34 AM

## 2022-12-04 ENCOUNTER — Inpatient Hospital Stay (HOSPITAL_COMMUNITY): Payer: Medicare Other

## 2022-12-04 DIAGNOSIS — R1031 Right lower quadrant pain: Secondary | ICD-10-CM

## 2022-12-04 DIAGNOSIS — K2289 Other specified disease of esophagus: Secondary | ICD-10-CM | POA: Diagnosis not present

## 2022-12-04 DIAGNOSIS — N200 Calculus of kidney: Secondary | ICD-10-CM | POA: Diagnosis not present

## 2022-12-04 DIAGNOSIS — R1115 Cyclical vomiting syndrome unrelated to migraine: Secondary | ICD-10-CM

## 2022-12-04 LAB — COMPREHENSIVE METABOLIC PANEL
ALT: 12 U/L (ref 0–44)
AST: 23 U/L (ref 15–41)
Albumin: 3.8 g/dL (ref 3.5–5.0)
Alkaline Phosphatase: 72 U/L (ref 38–126)
Anion gap: 11 (ref 5–15)
BUN: 8 mg/dL (ref 6–20)
CO2: 19 mmol/L — ABNORMAL LOW (ref 22–32)
Calcium: 8.4 mg/dL — ABNORMAL LOW (ref 8.9–10.3)
Chloride: 105 mmol/L (ref 98–111)
Creatinine, Ser: 1.11 mg/dL — ABNORMAL HIGH (ref 0.44–1.00)
GFR, Estimated: 59 mL/min — ABNORMAL LOW (ref >60)
Glucose, Bld: 74 mg/dL (ref 70–99)
Potassium: 3.8 mmol/L (ref 3.5–5.1)
Sodium: 135 mmol/L (ref 135–145)
Total Bilirubin: 0.8 mg/dL (ref 0.3–1.2)
Total Protein: 7.2 g/dL (ref 6.5–8.1)

## 2022-12-04 LAB — CBC
HCT: 47.9 % — ABNORMAL HIGH (ref 36.0–46.0)
Hemoglobin: 15 g/dL (ref 12.0–15.0)
MCH: 30.3 pg (ref 26.0–34.0)
MCHC: 31.3 g/dL (ref 30.0–36.0)
MCV: 96.8 fL (ref 80.0–100.0)
Platelets: 179 10*3/uL (ref 150–400)
RBC: 4.95 MIL/uL (ref 3.87–5.11)
RDW: 15.4 % (ref 11.5–15.5)
WBC: 10.3 10*3/uL (ref 4.0–10.5)
nRBC: 0 % (ref 0.0–0.2)

## 2022-12-04 LAB — MAGNESIUM: Magnesium: 2.2 mg/dL (ref 1.7–2.4)

## 2022-12-04 MED ORDER — IOHEXOL 9 MG/ML PO SOLN
ORAL | Status: AC
  Filled 2022-12-04: qty 1000

## 2022-12-04 MED ORDER — SODIUM CHLORIDE 0.9 % IV SOLN: INTRAVENOUS | Status: DC

## 2022-12-04 MED ORDER — ONDANSETRON HCL 4 MG/2ML IJ SOLN
4.0000 mg | Freq: Four times a day (QID) | INTRAMUSCULAR | Status: DC | PRN
  Administered 2022-12-06: 4 mg via INTRAVENOUS
  Filled 2022-12-04: qty 2

## 2022-12-04 MED ORDER — PROCHLORPERAZINE EDISYLATE 10 MG/2ML IJ SOLN
5.0000 mg | Freq: Once | INTRAMUSCULAR | Status: AC
  Administered 2022-12-04: 5 mg via INTRAVENOUS

## 2022-12-04 MED ORDER — METOCLOPRAMIDE HCL 5 MG/ML IJ SOLN
10.0000 mg | Freq: Four times a day (QID) | INTRAMUSCULAR | Status: DC
  Administered 2022-12-04 – 2022-12-06 (×7): 10 mg via INTRAVENOUS
  Filled 2022-12-04 (×7): qty 2

## 2022-12-04 MED ORDER — PROCHLORPERAZINE EDISYLATE 10 MG/2ML IJ SOLN
INTRAMUSCULAR | Status: AC
  Filled 2022-12-04: qty 2

## 2022-12-04 MED ORDER — PROCHLORPERAZINE EDISYLATE 10 MG/2ML IJ SOLN
5.0000 mg | Freq: Once | INTRAMUSCULAR | Status: AC
  Administered 2022-12-04: 5 mg via INTRAVENOUS
  Filled 2022-12-04: qty 2

## 2022-12-04 MED ORDER — IOHEXOL 9 MG/ML PO SOLN
1000.0000 mL | ORAL | Status: AC
  Administered 2022-12-04: 1000 mL via ORAL

## 2022-12-04 MED ORDER — IOHEXOL 300 MG/ML  SOLN
100.0000 mL | Freq: Once | INTRAMUSCULAR | Status: AC | PRN
  Administered 2022-12-04: 100 mL via INTRAVENOUS

## 2022-12-04 NOTE — Plan of Care (Signed)
  Problem: Pain Management: Goal: General experience of comfort will improve Outcome: Progressing   Problem: Safety: Goal: Ability to remain free from injury will improve Outcome: Progressing

## 2022-12-04 NOTE — Progress Notes (Signed)
Transition of Care [facility]) - Inpatient Brief Assessment   Patient Details  Name: EDOM MUNOS MRN: [id number] Date of Birth: [birth year]  [facility] CM/SW Contact:    Larrie Kass, LCSW Phone Number: 12/04/2022, 9:45 AM  Transition of Care Asessment: Insurance and Status: Insurance coverage has been reviewed Patient has primary care physician: Yes Home environment has been reviewed: home with spouse Prior level of function:: independent Prior/Current Home Services: No current home services Social Determinants of Health Reivew: SDOH reviewed no interventions necessary Readmission risk has been reviewed: Yes Transition of care needs: no transition of care needs at this time

## 2022-12-04 NOTE — Consult Note (Addendum)
Referring Provider: Dr. Blanchard Mane  Primary Care Physician:  Kaleen Mask, MD Primary Gastroenterologist:  Dr.John Marina Goodell   Reason for Consultation: Intractable nausea and vomiting  HPI: Cynthia [name] is a 57 y.o. female past medical history of anxiety, depression, hyperlipidemia, pericarditis, hypothyroidism, kidney stones s/p ureteral stent and ureteroscopy, colon polyps, cecal bascule and a small bowel obstruction for which she subsequently underwent surgery including lysis of adhesions and appendectomy. Past cholecystectomy hysterectomy and ventral hernia repair.  She presented to the ED 12/02/2022 with right flank pain. Labs in the ED showed a WBC count of 9.2.  Hemoglobin 16.2.  Hematocrit 48.7.  Platelet 219.  Sodium 133 potassium 3.4.  BUN 10.  Creatinine 1.75.  Total bili 0.6.  Alk phos 73.  AST 27.  ALT 14.  Lipase 48.  hCG< 1.  CK 78.  TSH 2.112. UA negative for UTI. CTAP without contrast 12/02/2022 done at [facility] showed nonobstructing bilateral renal calculi otherwise was unremarkable. She demonstrated active nausea and vomiting without hematemesis which persisted despite receiving Zofran, Phenergan, Reglan and Compazine.  A GI consult was requested for further evaluation regarding intractable nausea and vomiting.  She felt well until Sunday 11/30/2022 when she developed right flank pain. The next day, she had bilateral flank pain and RLQ pain with nausea, vomiting and dry heaves. She was seen by her PCP on Mon 10/29 and she received Toradol and Flomax for suspected kidney stones. CTAP without contrast was done as an outpatient which showed nonobstructing bilateral renal calculi otherwise was unremarkable. Her flank and RLQ pain worsened therefore she was sent to the ED as noted above. She continues to have nausea with nonbloody bilious emesis, she endorsed vomiting every 2-3 hours last night, last episode occurred between 6 and 7 AM.  No GERD. She described having a  globus sensation with slight difficulty swallowing pills and less frequently difficulty swallowing food which started 6 weeks ago. She takes ASA 81 mg daily at home for migraine headaches as recommended by her neurologist.  She has been constipated and received a Deflux suppository yesterday which resulted in passing 3 Troup loose stools with a few flakes of bright red blood.  She underwent a colonoscopy 08/2022 by Dr. Marina Goodell which was normal.  She is a non-smoker.  No alcohol or drug use.  She is on Ozempic for the past 2 years. 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  GI PROCEDURES:  Colonoscopy 08/15/2022 by Dr. Marina Goodell: - The entire examined colon is normal on direct and retroflexion views.  - No specimens collected. - Recall colonoscopy 5 years  Colonoscopy 12/18/2016: - One 2 mm polyp in the sigmoid colon, removed with a cold snare. Resected and retrieved.  - Non-bleeding internal hemorrhoids.  - The examination was otherwise normal on direct and retroflexion views. TUBULAR ADENOMA. - NO HIGH GRADE DYSPLASIA OR MALIGNANCY.  Colonoscopy 10/03/2015: - Three 2 to 4 mm polyps in the descending colon and in the transverse colon, removed with a cold snare. Resected and retrieved.  - Internal hemorrhoids.  - The examination was otherwise normal on direct and retroflexion views. 1. Surgical [P], transverse, polyp (2) - TUBULAR ADENOMA (X1 FRAGMENT). - BENIGN COLONIC MUCOSA (X1 FRAGMENT). - NO HIGH GRADE DYSPLASIA OR MALIGNANCY. 2. Surgical [P], descending, polyp - TUBULAR ADENOMA (X2 FRAGMENTS). - NO HIGH GRADE DYSPLASIA OR MALIGNANCY.  EGD 09/07/2015: - Normal EGD.  - Suspect problems with pain and recent  partial obstruction secondary to adhesions. Now resolved.  EGD 01/08/2012: -Mild esophagitis otherwise normal EGD  Colonoscopy 01/08/2012: 2 polyps measuring 3 to 5 mm removed from the transverse and sigmoid colon Normal  mucosa in the terminal ileum The colon was otherwise normal Surgical [P], transverse, sigmoid, polyp - SESSILE SERRATED ADENOMA. - HYPERPLASTIC POLYP  Colonoscopy 03/09/2009: 8 mm polyp removed from the sigmoid colon  IMAGE STUDIES:  CTAP without IV contrast 12/02/2022 at [facility]: FINDINGS:  Lower Chest: Visualized portions of the lung bases are clear. No cardiomegaly or pericardial thickening/effusion. No hiatal hernia.   Hepatobiliary: Although limited due to lack of intravenous contrast, normal appearance without focal gross abnormality. The patient is status post cholecystectomy.   Pancreas: No pancreatic ductal dilation or surrounding inflammatory changes.   Spleen: Normal in size without focal abnormality.   Adrenals/Urinary Tract: Both adrenal glands appear normal. Scattered bilateral renal calculi are seen. The largest measuring 8 mm within the midpole the right kidney and 4 mm in the upper pole of the left kidney. The bladder is unremarkable.   Stomach/Bowel: The stomach, small bowel, and colon are normal in appearance. No inflammatory changes, wall thickening, or obstructive findings.  A moderate amount of colonic stool with scattered colonic diverticulosis is seen.   Lymph Nodes/ Vascular: No significant gross vascular findings are present given the lack of intravenous contrast. There are no enlarged abdominal or pelvic lymph nodes.   Pelvis: The patient is status post cholecystectomy.   Other: No abdominal wall mass or hernia. No free fluid.   Musculoskeletal: No acute or significant osseous abnormality.    IMPRESSION:  Nonobstructing bilateral renal calculi.   Past Medical History:  Diagnosis Date   Allergy    Anxiety    Bladder disorder    Colitis 2006   occurred in presnce of SBO , no issues since    Colon polyps    Complication of anesthesia    problems waking up and PONV   Depression    Diverticulosis    Headache    Hyperlipemia     Hypothyroidism    Kidney stone    Paroxysmal ventricular tachycardia (HCC) 2015   Pericarditis 2015   PONV (postoperative nausea and vomiting)    Rapid heart rate    maybe once a month since pericarditis   Rosacea    SBO (small bowel obstruction) (HCC) 08/16/2015   Vitamin D deficiency     Past Surgical History:  Procedure Laterality Date   ACHILLES TENDON SURGERY Left    APPENDECTOMY     CARDIAC CATHETERIZATION  11/15/2008   SMOOTH AND NORMAL   CHOLECYSTECTOMY     COLONOSCOPY     LAPAROSCOPIC ASSISTED VENTRAL HERNIA REPAIR N/A 08/14/2017   Procedure: LAPAROSCOPIC ASSISTED VENTRAL HERNIA REPAIR ERAS PATHWAY, CECOPEXY;  Surgeon: Luretha Murphy, MD;  Location: [facility];  Service: General;  Laterality: N/A;   LAPAROSCOPY N/A 10/26/2015   Procedure: diagnostic LAPAROSCOPY LAPAROTOMY AND APPENDECTOMY;  Surgeon: Luretha Murphy, MD;  Location: [facility];  Service: General;  Laterality: N/A;   PATELLA-FEMORAL ARTHROPLASTY Right 10/03/2020   Procedure: PATELLA-FEMORAL ARTHROPLASTY;  Surgeon: Eugenia Mcalpine, MD;  Location: [facility];  Service: Orthopedics;  Laterality: Right;  adductor canal block   POLYPECTOMY  02/2009   SHOULDER SURGERY  2010   left   TOTAL ABDOMINAL HYSTERECTOMY W/ BILATERAL SALPINGOOPHORECTOMY  2005   UPPER GASTROINTESTINAL ENDOSCOPY      Prior to Admission medications   Medication Sig Start Date End Date Taking? Authorizing  Provider  acetaminophen (TYLENOL) 500 MG tablet Take 1,000 mg by mouth daily as needed for moderate pain or headache.   Yes [provider]  ALPRAZolam Prudy Feeler) 1 MG tablet Take 0.5 mg by mouth 3 (three) times daily as needed for anxiety or sleep.   Yes [provider]  aspirin 81 MG chewable tablet Chew 81 mg by mouth daily.   Yes [provider]  Cholecalciferol (VITAMIN D) 50 MCG (2000 UT) tablet Take 2,000 Units by mouth daily.   Yes [provider]  DAYVIGO 10 MG TABS Take 1 tablet by mouth at bedtime.    Yes [provider]  Doxepin HCl 6 MG TABS Take 6 mg by mouth at bedtime as needed for sleep. 09/11/20  Yes [provider]  estrogens-methylTEST (ESTRATEST) 1.25-2.5 MG tablet Take 1 tablet by mouth at bedtime.  03/30/17  Yes [provider]  lamoTRIgine (LAMICTAL) 200 MG tablet Take 400 mg by mouth at bedtime.   Yes [provider]  Levothyroxine Sodium 88 MCG CAPS Take 88 mcg by mouth at bedtime.   Yes [provider]  lisinopril (ZESTRIL) 2.5 MG tablet Take 2.5 mg by mouth daily.   Yes [provider]  metoprolol succinate (TOPROL-XL) 50 MG 24 hr tablet Take 50 mg by mouth daily.   Yes [provider]  NURTEC 75 MG TBDP Take 1 tablet by mouth daily as needed. 07/23/22  Yes [provider]  OZEMPIC, 2 MG/DOSE, 8 MG/3ML SOPN Inject 2 mg as directed once a week. 02/08/21  Yes [provider]  RHOFADE 1 % CREA Apply 1 Application topically every morning.   Yes [provider]  rosuvastatin (CRESTOR) 40 MG tablet Take 40 mg by mouth daily. 04/12/17  Yes [provider]  SPRIX 15.75 MG/SPRAY SOLN Place 1 spray into the nose every 6 (six) hours as needed (migrane pain). 11/12/21  Yes [provider]  tamsulosin (FLOMAX) 0.4 MG CAPS capsule Take 0.4 mg by mouth at bedtime. 02/15/21  Yes [provider]  VRAYLAR 3 MG capsule Take 3 mg by mouth daily.   Yes [provider]    Current Facility-Administered Medications  Medication Dose Route Frequency Provider Last Rate Last Admin   acetaminophen (TYLENOL) tablet 650 mg  650 mg Oral Q6H PRN Doutova, Anastassia, MD       Or   acetaminophen (TYLENOL) suppository 650 mg  650 mg Rectal Q6H PRN Doutova, Anastassia, MD       bisacodyl (DULCOLAX) EC tablet 10 mg  10 mg Oral Daily Alwyn Ren, MD       dicyclomine (BENTYL) capsule 10 mg  10 mg Oral TID AC & HS Alwyn Ren, MD       HYDROcodone-acetaminophen (NORCO/VICODIN)  5-325 MG per tablet 1-2 tablet  1-2 tablet Oral Q4H PRN Therisa Doyne, MD       HYDROmorphone (DILAUDID) injection 1 mg  1 mg Intravenous Q2H PRN Alwyn Ren, MD   1 mg at 12/04/22 2440   levothyroxine (SYNTHROID) tablet 88 mcg  88 mcg Oral QHS Doutova, Anastassia, MD       methocarbamol (ROBAXIN) injection 500 mg  500 mg Intravenous Q6H PRN Doutova, Anastassia, MD       ondansetron (ZOFRAN) injection 4 mg  4 mg Intravenous Q6H PRN Alwyn Ren, MD       Oral care mouth rinse  15 mL Mouth Rinse PRN Therisa Doyne, MD  pantoprazole (PROTONIX) injection 40 mg  40 mg Intravenous Q12H Alwyn Ren, MD   40 mg at 12/03/22 1716   polyethylene glycol (MIRALAX / GLYCOLAX) packet 17 g  17 g Oral Daily Alwyn Ren, MD       promethazine (PHENERGAN) 25 mg in sodium chloride 0.9 % 50 mL IVPB  25 mg Intravenous Q6H PRN Alwyn Ren, MD 150 mL/hr at 12/04/22 0625 25 mg at 12/04/22 5621   rosuvastatin (CRESTOR) tablet 40 mg  40 mg Oral Daily Doutova, Jonny Ruiz, MD       senna-docusate (Senokot-S) tablet 3 tablet  3 tablet Oral QHS Alwyn Ren, MD       tamsulosin [facility]) capsule 0.4 mg  0.4 mg Oral QPC supper Therisa Doyne, MD        Allergies as of 12/02/2022 - Review Complete 12/02/2022  Allergen Reaction Noted   Prednisone Other (See Comments) 06/30/2013   Amoxicillin Other (See Comments) 10/19/2015   Ampicillin Other (See Comments) 04/10/2010   Apple fruit extract Itching 05/04/2017   Apple juice Itching 05/04/2017   Cherry extract Itching 05/04/2017   Fruit & vegetable daily [nutritional supplements] Itching 05/04/2017   Imitrex [sumatriptan] Other (See Comments) 03/29/2013   Nutritional supplements Itching 05/04/2017   Other Itching 09/25/2015   Peach [prunus persica] Itching 05/04/2017   Strawberry extract Itching 05/04/2017   Zoloft [sertraline hcl] Other (See Comments) 03/29/2013    Family History  Adopted: Yes   Problem Relation Age of Onset   Breast cancer Neg Hx     Social History   Socioeconomic History   Marital status: Married    Spouse name: Not on file   Number of children: 1 A   Years of education: MA   Highest education level: Not on file  Occupational History   Occupation: Doctor, general practice    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   Caffeine Use: quit in 02/2011.   Social Determinants of Health   Financial Resource Strain: Not on file  Food Insecurity: No Food Insecurity (12/03/2022)   Hunger Vital Sign    Worried About Running Out of Food in the Last Year: Never true    Ran Out of Food in the Last Year: Never true  Transportation Needs: No Transportation Needs (12/03/2022)   PRAPARE - Administrator, Civil Service (Medical): No    Lack of Transportation (Non-Medical): No  Physical Activity: Not on file  Stress: Not on file  Social Connections: Unknown (12/01/2022)   Received from [facility]   Social Network    Social Network: Not on file  Intimate Partner Violence: Not At Risk (12/03/2022)   Humiliation, Afraid, Rape, and Kick questionnaire    Fear of Current or Ex-Partner: No    Emotionally Abused: No    Physically Abused: No    Sexually Abused: No   Review of Systems: Gen: Denies fever, sweats or chills. No weight loss.  CV: Denies chest pain, palpitations or edema. Resp: Denies cough, shortness of breath of hemoptysis.  GI: See HPI.    GU : Denies urinary burning, blood in urine, increased  urinary frequency or incontinence. MS: Denies joint pain, muscles aches or weakness. Derm: Denies rash, itchiness, skin lesions or unhealing ulcers. Psych:  Anxiety and depression. Heme: Denies easy bruising, bleeding. Neuro:  Denies headaches, dizziness or paresthesias. Endo:  Denies any problems with DM, thyroid or adrenal function.  Physical Exam: Vital signs in last 24 hours: Temp:  [98.7 F (37.1 C)-99 F (37.2 C)] 98.7 F (37.1 C) (10/31 0408) Pulse Rate:  [80-86] 86 (10/31 0408) Resp:  [16-18] 16 (10/31 0408) BP: (117-136)/(60-63) 117/60 (10/31 0408) SpO2:  [94 %-96 %] 96 % (10/31 0408) Last BM Date : 12/03/22 General: 57 year old female in no acute distress. Head:  Normocephalic and atraumatic. Eyes:  No scleral icterus. Conjunctiva pink. Ears:  Normal auditory acuity. Nose:  No deformity, discharge or lesions. Mouth:  Dentition intact. No ulcers or lesions.  Neck:  Supple. No lymphadenopathy or thyromegaly.  Lungs: Breath sounds clear throughout. No wheezes, rhonchi or crackles.  Heart: Regular rate rhythm, no murmurs. Abdomen: Obese abdomen, soft.  Mild tenderness to the right mid to lower abdomen without rebound or guarding.  No LLQ tenderness.  No palpable mass.  No bruit.  Positive bowel sounds to all 4 quadrants. Rectal: Deferred. Musculoskeletal:  Symmetrical without gross deformities.  Pulses:  Normal pulses noted. Extremities:  Without clubbing or edema. Neurologic:  Alert and  oriented x 4. No focal deficits.  Skin:  Intact without significant lesions or rashes. Psych:  Alert and cooperative. Normal mood and affect.  Intake/Output from previous day: 10/30 0701 - 10/31 0700 In: 2383.9 [I.V.:1651.3; IV Piggyback:732.6] Out: 2500 [Urine:1550; Emesis/NG output:950] Intake/Output this shift: No intake/output data recorded.  Lab Results: Recent Labs    12/02/22 2020 12/03/22 0441 12/04/22 0418  WBC 9.2 11.2* 10.3  HGB 16.2* 14.9 15.0  HCT 48.7* 45.7 47.9*  PLT 219 209 179   BMET Recent Labs    12/02/22 2020 12/03/22 0441 12/04/22 0418  NA 133* 138 135  K 3.4* 3.0* 3.8  CL 99 103 105  CO2 27 24 19*   GLUCOSE 85 93 74  BUN 10 9 8   CREATININE 1.75* 1.46* 1.11*  CALCIUM 9.1 8.9 8.4*   LFT Recent Labs    12/04/22 0418  PROT 7.2  ALBUMIN 3.8  AST 23  ALT 12  ALKPHOS 72  BILITOT 0.8       Studies/Results: DG Abd 1 View  Result Date: 12/03/2022 CLINICAL DATA:  Intractable nausea and vomiting EXAM: ABDOMEN - 1 VIEW COMPARISON:  12/03/2022 FINDINGS: Two supine frontal views of the abdomen and pelvis are obtained. No bowel obstruction or ileus. Moderate gas and stool throughout the colon unchanged. No abdominal masses. Punctate right renal calculi are unchanged. Numerous phleboliths are seen within the pelvis. No acute bony abnormalities. IMPRESSION: 1. Unremarkable bowel gas pattern.  No obstruction or ileus. 2. Moderate retained stool throughout the colon. 3. Stable right-sided nephrolithiasis. Electronically Signed   By: Sharlet Salina M.D.   On: 12/03/2022 21:02   DG Abd 1 View  Result Date: 12/03/2022 CLINICAL DATA:  Ileus EXAM: ABDOMEN - 1 VIEW COMPARISON:  12/02/2022 FINDINGS: Nonobstructive bowel gas pattern. Moderate volume stool throughout the colon. Cholecystectomy clips. Multiple small stones project over the right renal shadow. IMPRESSION: 1. Nonobstructive bowel gas pattern. 2. Right nephrolithiasis. 3. Moderate volume stool throughout the colon. Electronically Signed   By: Duanne Guess D.O.   On: 12/03/2022 16:52    IMPRESSION/PLAN:  57 year old female admitted to the hospital with bilateral flank pain  and RLQ pain. WBC 11.2 -> 10.3. Cr 1.75 -> 1.46 -> 1.11. CTAP without contrast 10/29 at [facility] showed nonobstructing kidney stones without evidence of a bowel obstruction.  Adhesions from multiple abdominal surgeries may be a contributing factor. -Consider repeat CTAP with oral and IV contrast as renal status has improved since admission -Pain management per the hospitalist  Intractable nausea and vomiting. CTAP without contrast 10/29 at [facility] showed  nonobstructing kidney stones without evidence of a bowel obstruction. Past cholecystectomy. On Ozempic x 2 years. Normal LFTs and lipase level.  -Clear liquid diet -Continue Pantoprazole 40 mg IV twice daily -Continue Ondansetron 4 mg IV every 6 hours as needed -Continue Promethazine 25 mg IV every 6 hours as needed -Consider scheduled Reglan 5 mg p.o. every 8 hours if nausea/vomiting persists -Recommend stopping Ozempic -Consider EGD during this hospital admission, await further recommendations per Dr. Leone Payor  Dysphagia/globus sensation  -EGD as an outpatient versus during this hospitalization  Constipation -Miralax nightly as needed   Rectal bleeding, patient endorsed seeing specks of bright red blood on the stool yesterday after passing several loose stools.  Normal colonoscopy 08/2022 is reassuring.  Arnaldo Natal  12/04/2022, 10:45 AM   Gastroenterology attending:  I have seen and evaluated the patient as well, chart reviewed. The patient has a clinical scenario of persistent nausea and vomiting of unclear etiology.  Onset with right flank and lower quadrant pain sounds like nephrolithiasis but we do not have evidence for ureteral stones on imaging and though she had dip positive hemoglobin there are no red blood cells in her urine.  No CVA tenderness on my exam today.  We are going to workup further with CT of the abdomen and pelvis with contrast and then likely EGD tomorrow.  Last dose of Ozempic was a week and a half ago so acceptable to perform EGD with deep sedation in that setting.  Iva Boop, MD, [facility] See Loretha Stapler on call - gastroenterology for best contact person 12/04/2022 4:59 PM

## 2022-12-04 NOTE — Progress Notes (Signed)
PROGRESS NOTE    MINYON TOW  ZOX:[id number] DOB: [birth year] DOA: 12/02/2022 PCP: Kaleen Mask, MD   Brief Narrative:  57 year old female with history of kidney stones admitted with bilateral flank pain and nausea and vomiting.  History of kidney stones and bilateral ureteral stent placements in the past.  CT on admission shows bilateral kidney stones which are nonobstructive.  Her creatinine is elevated 1.75 on admission.  She was seen by Dr. Alvester Morin. She denied fever or chills dysuria hematuria.  She denied chest pain shortness of breath cough fever. She complains of being dizzy  She was being treated as an outpatient by her PCP with Toradol and Flomax with no improvement.  Other past medical history includes hyperlipidemia hypothyroidism and obesity. 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 Daphine Deutscher CT abdomen with moderate stool with colonic diverticulosis, bilateral nonobstructive renal calculi  Assessment & Plan:   Principal Problem:   Kidney stone Active Problems:   Hyperlipemia   Hypothyroidism   Obesity   AKI (acute kidney injury) (HCC)   Hypokalemia   #1 bilateral flank pain CT done outside hospital done 10/29 shows 8 mm stone in the right and stone in the left nonobstructing.  Seen by urology no intervention planned in hospital admission Will arrange for outpatient follow-up.    #2  Intractable nausea and vomiting not responding to Zofran, Phenergan Continue Phenergan and IV fluids On Lamictal dose is not increased recently she has been on the same dose for 10 years(nausea vomiting side effect) on hold On Ozempic for 2 years on hold KUB moderate stool throughout the colon Appreciate GI input ?  Possible EGD  #3 moderate amount of stool in the colon though she does not report constipation-she had bowel movements with stool softeners  #4 hypokalemia repleted  #5 depression on Lamictal on hold    Estimated body mass index is 34.3 kg/m as calculated from the  following:   Height as of this encounter: 5\' 7"  (1.702 m).   Weight as of this encounter: 99.3 kg.  DVT prophylaxis: Lovenox  code Status: Full code Family Communication: Husband at bedside  disposition Plan:  Status is: Inpatient Remains inpatient appropriate because: Intractable nausea vomiting   Consultants:  None discussed with general surgery over the phone  Procedures: None Antimicrobials: None  Subjective: Continues to complain of intractable nausea and vomiting overnight and today Normal lipase W BC normalized Potassium normal  Objective: Vitals:   12/03/22 0849 12/03/22 2003 12/04/22 0408 12/04/22 1323  BP: 114/69 136/63 117/60 110/66  Pulse: 82 80 86 81  Resp: 16 18 16 18   Temp: 98.3 F (36.8 C) 99 F (37.2 C) 98.7 F (37.1 C) 98.7 F (37.1 C)  TempSrc: Oral Oral Oral Oral  SpO2: 96% 94% 96% 97%  Weight:      Height:        Intake/Output Summary (Last 24 hours) at 12/04/2022 1520 Last data filed at 12/04/2022 1334 Gross per 24 hour  Intake 1526.83 ml  Output 1700 ml  Net -173.17 ml   Filed Weights   12/02/22 1740  Weight: 99.3 kg    Examination:  General exam: Appears in distress due to nausea and vomiting Respiratory system: Clear to auscultation. Respiratory effort normal. Cardiovascular system: S1 & S2 heard, RRR. No JVD, murmurs, rubs, gallops or clicks. No pedal edema. Gastrointestinal system: Abdomen is nondistended, soft and tender. No organomegaly or masses felt. Normal bowel sounds heard. Central nervous system: Alert and oriented. No focal neurological  deficits. Extremities: Symmetric 5 x 5 power.  Data Reviewed: I have personally reviewed following labs and imaging studies  CBC: Recent Labs  Lab 12/02/22 2020 12/03/22 0441 12/04/22 0418  WBC 9.2 11.2* 10.3  NEUTROABS 6.5  --   --   HGB 16.2* 14.9 15.0  HCT 48.7* 45.7 47.9*  MCV 92.1 92.9 96.8  PLT 219 209 179   Basic Metabolic Panel: Recent Labs  Lab 12/02/22 2020  12/03/22 0441 12/04/22 0418  NA 133* 138 135  K 3.4* 3.0* 3.8  CL 99 103 105  CO2 27 24 19*  GLUCOSE 85 93 74  BUN 10 9 8   CREATININE 1.75* 1.46* 1.11*  CALCIUM 9.1 8.9 8.4*  MG  --  2.0 2.2  PHOS  --  1.8*  --    GFR: Estimated Creatinine Clearance: 69.3 mL/min (A) (by C-G formula based on SCr of 1.11 mg/dL (H)). Liver Function Tests: Recent Labs  Lab 12/02/22 2020 12/03/22 0441 12/04/22 0418  AST 27 27 23   ALT 14 14 12   ALKPHOS 73 74 72  BILITOT 0.6 0.7 0.8  PROT 7.7 7.4 7.2  ALBUMIN 4.2 4.0 3.8   Recent Labs  Lab 12/02/22 2020  LIPASE 48   No results for input(s): "AMMONIA" in the last 168 hours. Coagulation Profile: No results for input(s): "INR", "PROTIME" in the last 168 hours. Cardiac Enzymes: Recent Labs  Lab 12/03/22 0411  CKTOTAL 78   BNP (last 3 results) No results for input(s): "PROBNP" in the last 8760 hours. HbA1C: No results for input(s): "HGBA1C" in the last 72 hours. CBG: Recent Labs  Lab 12/03/22 1653  GLUCAP 90   Lipid Profile: No results for input(s): "CHOL", "HDL", "LDLCALC", "TRIG", "CHOLHDL", "LDLDIRECT" in the last 72 hours. Thyroid Function Tests: Recent Labs    12/03/22 0441  TSH 2.112   Anemia Panel: No results for input(s): "VITAMINB12", "FOLATE", "FERRITIN", "TIBC", "IRON", "RETICCTPCT" in the last 72 hours. Sepsis Labs: No results for input(s): "PROCALCITON", "LATICACIDVEN" in the last 168 hours.  No results found for this or any previous visit (from the past 240 hour(s)).    Radiology Studies: DG Abd 1 View  Result Date: 12/03/2022 CLINICAL DATA:  Intractable nausea and vomiting EXAM: ABDOMEN - 1 VIEW COMPARISON:  12/03/2022 FINDINGS: Two supine frontal views of the abdomen and pelvis are obtained. No bowel obstruction or ileus. Moderate gas and stool throughout the colon unchanged. No abdominal masses. Punctate right renal calculi are unchanged. Numerous phleboliths are seen within the pelvis. No acute bony  abnormalities. IMPRESSION: 1. Unremarkable bowel gas pattern.  No obstruction or ileus. 2. Moderate retained stool throughout the colon. 3. Stable right-sided nephrolithiasis. Electronically Signed   By: Sharlet Salina M.D.   On: 12/03/2022 21:02   DG Abd 1 View  Result Date: 12/03/2022 CLINICAL DATA:  Ileus EXAM: ABDOMEN - 1 VIEW COMPARISON:  12/02/2022 FINDINGS: Nonobstructive bowel gas pattern. Moderate volume stool throughout the colon. Cholecystectomy clips. Multiple small stones project over the right renal shadow. IMPRESSION: 1. Nonobstructive bowel gas pattern. 2. Right nephrolithiasis. 3. Moderate volume stool throughout the colon. Electronically Signed   By: Duanne Guess D.O.   On: 12/03/2022 16:52     Scheduled Meds:  bisacodyl  10 mg Oral Daily   dicyclomine  10 mg Oral TID AC & HS   levothyroxine  88 mcg Oral QHS   pantoprazole (PROTONIX) IV  40 mg Intravenous Q12H   polyethylene glycol  17 g Oral Daily   rosuvastatin  40 mg Oral Daily   senna-docusate  3 tablet Oral QHS   tamsulosin  0.4 mg Oral QPC supper   Continuous Infusions:  sodium chloride 100 mL/hr at 12/04/22 1137   promethazine (PHENERGAN) injection (IM or IVPB) 25 mg (12/04/22 0625)     LOS: 2 days    Time spent: 38 min Alwyn Ren, MD 12/04/2022, 3:20 PM

## 2022-12-04 NOTE — Progress Notes (Signed)
Patient only had one occurrence of vomiting during day shift. Patient was able to drink and finish contrast for CT Abdomen Pelvis scan. Now, just waiting for patient to get CT scan.

## 2022-12-04 NOTE — Plan of Care (Signed)
  Problem: Activity: Goal: Risk for activity intolerance will decrease Outcome: Progressing   Problem: Coping: Goal: Level of anxiety will decrease Outcome: Progressing   Problem: Elimination: Goal: Will not experience complications related to urinary retention Outcome: Progressing   Problem: Pain Management: Goal: General experience of comfort will improve Outcome: Progressing   Problem: Safety: Goal: Ability to remain free from injury will improve Outcome: Progressing   Problem: Skin Integrity: Goal: Risk for impaired skin integrity will decrease Outcome: Progressing

## 2022-12-04 NOTE — H&P (View-Only) (Signed)
 Referring Provider: Dr. Blanchard Mane  Primary Care Physician:  Kaleen Mask, MD Primary Gastroenterologist:  Dr.John Marina Goodell   Reason for Consultation: Intractable nausea and vomiting  HPI: Cynthia [name] is a 57 y.o. female past medical history of anxiety, depression, hyperlipidemia, pericarditis, hypothyroidism, kidney stones s/p ureteral stent and ureteroscopy, colon polyps, cecal bascule and a small bowel obstruction for which she subsequently underwent surgery including lysis of adhesions and appendectomy. Past cholecystectomy hysterectomy and ventral hernia repair.  She presented to the ED 12/02/2022 with right flank pain. Labs in the ED showed a WBC count of 9.2.  Hemoglobin 16.2.  Hematocrit 48.7.  Platelet 219.  Sodium 133 potassium 3.4.  BUN 10.  Creatinine 1.75.  Total bili 0.6.  Alk phos 73.  AST 27.  ALT 14.  Lipase 48.  hCG< 1.  CK 78.  TSH 2.112. UA negative for UTI. CTAP without contrast 12/02/2022 done at [facility] showed nonobstructing bilateral renal calculi otherwise was unremarkable. She demonstrated active nausea and vomiting without hematemesis which persisted despite receiving Zofran, Phenergan, Reglan and Compazine.  A GI consult was requested for further evaluation regarding intractable nausea and vomiting.  She felt well until Sunday 11/30/2022 when she developed right flank pain. The next day, she had bilateral flank pain and RLQ pain with nausea, vomiting and dry heaves. She was seen by her PCP on Mon 10/29 and she received Toradol and Flomax for suspected kidney stones. CTAP without contrast was done as an outpatient which showed nonobstructing bilateral renal calculi otherwise was unremarkable. Her flank and RLQ pain worsened therefore she was sent to the ED as noted above. She continues to have nausea with nonbloody bilious emesis, she endorsed vomiting every 2-3 hours last night, last episode occurred between 6 and 7 AM.  No GERD. She described having a  globus sensation with slight difficulty swallowing pills and less frequently difficulty swallowing food which started 6 weeks ago. She takes ASA 81 mg daily at home for migraine headaches as recommended by her neurologist.  She has been constipated and received a Deflux suppository yesterday which resulted in passing 3 Troup loose stools with a few flakes of bright red blood.  She underwent a colonoscopy 08/2022 by Dr. Marina Goodell which was normal.  She is a non-smoker.  No alcohol or drug use.  She is on Ozempic for the past 2 years. 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  GI PROCEDURES:  Colonoscopy 08/15/2022 by Dr. Marina Goodell: - The entire examined colon is normal on direct and retroflexion views.  - No specimens collected. - Recall colonoscopy 5 years  Colonoscopy 12/18/2016: - One 2 mm polyp in the sigmoid colon, removed with a cold snare. Resected and retrieved.  - Non-bleeding internal hemorrhoids.  - The examination was otherwise normal on direct and retroflexion views. TUBULAR ADENOMA. - NO HIGH GRADE DYSPLASIA OR MALIGNANCY.  Colonoscopy 10/03/2015: - Three 2 to 4 mm polyps in the descending colon and in the transverse colon, removed with a cold snare. Resected and retrieved.  - Internal hemorrhoids.  - The examination was otherwise normal on direct and retroflexion views. 1. Surgical [P], transverse, polyp (2) - TUBULAR ADENOMA (X1 FRAGMENT). - BENIGN COLONIC MUCOSA (X1 FRAGMENT). - NO HIGH GRADE DYSPLASIA OR MALIGNANCY. 2. Surgical [P], descending, polyp - TUBULAR ADENOMA (X2 FRAGMENTS). - NO HIGH GRADE DYSPLASIA OR MALIGNANCY.  EGD 09/07/2015: - Normal EGD.  - Suspect problems with pain and recent  partial obstruction secondary to adhesions. Now resolved.  EGD 01/08/2012: -Mild esophagitis otherwise normal EGD  Colonoscopy 01/08/2012: 2 polyps measuring 3 to 5 mm removed from the transverse and sigmoid colon Normal  mucosa in the terminal ileum The colon was otherwise normal Surgical [P], transverse, sigmoid, polyp - SESSILE SERRATED ADENOMA. - HYPERPLASTIC POLYP  Colonoscopy 03/09/2009: 8 mm polyp removed from the sigmoid colon  IMAGE STUDIES:  CTAP without IV contrast 12/02/2022 at [facility]: FINDINGS:  Lower Chest: Visualized portions of the lung bases are clear. No cardiomegaly or pericardial thickening/effusion. No hiatal hernia.   Hepatobiliary: Although limited due to lack of intravenous contrast, normal appearance without focal gross abnormality. The patient is status post cholecystectomy.   Pancreas: No pancreatic ductal dilation or surrounding inflammatory changes.   Spleen: Normal in size without focal abnormality.   Adrenals/Urinary Tract: Both adrenal glands appear normal. Scattered bilateral renal calculi are seen. The largest measuring 8 mm within the midpole the right kidney and 4 mm in the upper pole of the left kidney. The bladder is unremarkable.   Stomach/Bowel: The stomach, small bowel, and colon are normal in appearance. No inflammatory changes, wall thickening, or obstructive findings.  A moderate amount of colonic stool with scattered colonic diverticulosis is seen.   Lymph Nodes/ Vascular: No significant gross vascular findings are present given the lack of intravenous contrast. There are no enlarged abdominal or pelvic lymph nodes.   Pelvis: The patient is status post cholecystectomy.   Other: No abdominal wall mass or hernia. No free fluid.   Musculoskeletal: No acute or significant osseous abnormality.    IMPRESSION:  Nonobstructing bilateral renal calculi.   Past Medical History:  Diagnosis Date   Allergy    Anxiety    Bladder disorder    Colitis 2006   occurred in presnce of SBO , no issues since    Colon polyps    Complication of anesthesia    problems waking up and PONV   Depression    Diverticulosis    Headache    Hyperlipemia     Hypothyroidism    Kidney stone    Paroxysmal ventricular tachycardia (HCC) 2015   Pericarditis 2015   PONV (postoperative nausea and vomiting)    Rapid heart rate    maybe once a month since pericarditis   Rosacea    SBO (small bowel obstruction) (HCC) 08/16/2015   Vitamin D deficiency     Past Surgical History:  Procedure Laterality Date   ACHILLES TENDON SURGERY Left    APPENDECTOMY     CARDIAC CATHETERIZATION  11/15/2008   SMOOTH AND NORMAL   CHOLECYSTECTOMY     COLONOSCOPY     LAPAROSCOPIC ASSISTED VENTRAL HERNIA REPAIR N/A 08/14/2017   Procedure: LAPAROSCOPIC ASSISTED VENTRAL HERNIA REPAIR ERAS PATHWAY, CECOPEXY;  Surgeon: Luretha Murphy, MD;  Location: [facility];  Service: General;  Laterality: N/A;   LAPAROSCOPY N/A 10/26/2015   Procedure: diagnostic LAPAROSCOPY LAPAROTOMY AND APPENDECTOMY;  Surgeon: Luretha Murphy, MD;  Location: [facility];  Service: General;  Laterality: N/A;   PATELLA-FEMORAL ARTHROPLASTY Right 10/03/2020   Procedure: PATELLA-FEMORAL ARTHROPLASTY;  Surgeon: Eugenia Mcalpine, MD;  Location: [facility];  Service: Orthopedics;  Laterality: Right;  adductor canal block   POLYPECTOMY  02/2009   SHOULDER SURGERY  2010   left   TOTAL ABDOMINAL HYSTERECTOMY W/ BILATERAL SALPINGOOPHORECTOMY  2005   UPPER GASTROINTESTINAL ENDOSCOPY      Prior to Admission medications   Medication Sig Start Date End Date Taking? Authorizing  Provider  acetaminophen (TYLENOL) 500 MG tablet Take 1,000 mg by mouth daily as needed for moderate pain or headache.   Yes [provider]  ALPRAZolam Prudy Feeler) 1 MG tablet Take 0.5 mg by mouth 3 (three) times daily as needed for anxiety or sleep.   Yes [provider]  aspirin 81 MG chewable tablet Chew 81 mg by mouth daily.   Yes [provider]  Cholecalciferol (VITAMIN D) 50 MCG (2000 UT) tablet Take 2,000 Units by mouth daily.   Yes [provider]  DAYVIGO 10 MG TABS Take 1 tablet by mouth at bedtime.    Yes [provider]  Doxepin HCl 6 MG TABS Take 6 mg by mouth at bedtime as needed for sleep. 09/11/20  Yes [provider]  estrogens-methylTEST (ESTRATEST) 1.25-2.5 MG tablet Take 1 tablet by mouth at bedtime.  03/30/17  Yes [provider]  lamoTRIgine (LAMICTAL) 200 MG tablet Take 400 mg by mouth at bedtime.   Yes [provider]  Levothyroxine Sodium 88 MCG CAPS Take 88 mcg by mouth at bedtime.   Yes [provider]  lisinopril (ZESTRIL) 2.5 MG tablet Take 2.5 mg by mouth daily.   Yes [provider]  metoprolol succinate (TOPROL-XL) 50 MG 24 hr tablet Take 50 mg by mouth daily.   Yes [provider]  NURTEC 75 MG TBDP Take 1 tablet by mouth daily as needed. 07/23/22  Yes [provider]  OZEMPIC, 2 MG/DOSE, 8 MG/3ML SOPN Inject 2 mg as directed once a week. 02/08/21  Yes [provider]  RHOFADE 1 % CREA Apply 1 Application topically every morning.   Yes [provider]  rosuvastatin (CRESTOR) 40 MG tablet Take 40 mg by mouth daily. 04/12/17  Yes [provider]  SPRIX 15.75 MG/SPRAY SOLN Place 1 spray into the nose every 6 (six) hours as needed (migrane pain). 11/12/21  Yes [provider]  tamsulosin (FLOMAX) 0.4 MG CAPS capsule Take 0.4 mg by mouth at bedtime. 02/15/21  Yes [provider]  VRAYLAR 3 MG capsule Take 3 mg by mouth daily.   Yes [provider]    Current Facility-Administered Medications  Medication Dose Route Frequency Provider Last Rate Last Admin   acetaminophen (TYLENOL) tablet 650 mg  650 mg Oral Q6H PRN Doutova, Anastassia, MD       Or   acetaminophen (TYLENOL) suppository 650 mg  650 mg Rectal Q6H PRN Doutova, Anastassia, MD       bisacodyl (DULCOLAX) EC tablet 10 mg  10 mg Oral Daily Alwyn Ren, MD       dicyclomine (BENTYL) capsule 10 mg  10 mg Oral TID AC & HS Alwyn Ren, MD       HYDROcodone-acetaminophen (NORCO/VICODIN)  5-325 MG per tablet 1-2 tablet  1-2 tablet Oral Q4H PRN Therisa Doyne, MD       HYDROmorphone (DILAUDID) injection 1 mg  1 mg Intravenous Q2H PRN Alwyn Ren, MD   1 mg at 12/04/22 2440   levothyroxine (SYNTHROID) tablet 88 mcg  88 mcg Oral QHS Doutova, Anastassia, MD       methocarbamol (ROBAXIN) injection 500 mg  500 mg Intravenous Q6H PRN Doutova, Anastassia, MD       ondansetron (ZOFRAN) injection 4 mg  4 mg Intravenous Q6H PRN Alwyn Ren, MD       Oral care mouth rinse  15 mL Mouth Rinse PRN Therisa Doyne, MD  pantoprazole (PROTONIX) injection 40 mg  40 mg Intravenous Q12H Alwyn Ren, MD   40 mg at 12/03/22 1716   polyethylene glycol (MIRALAX / GLYCOLAX) packet 17 g  17 g Oral Daily Alwyn Ren, MD       promethazine (PHENERGAN) 25 mg in sodium chloride 0.9 % 50 mL IVPB  25 mg Intravenous Q6H PRN Alwyn Ren, MD 150 mL/hr at 12/04/22 0625 25 mg at 12/04/22 5621   rosuvastatin (CRESTOR) tablet 40 mg  40 mg Oral Daily Doutova, Jonny Ruiz, MD       senna-docusate (Senokot-S) tablet 3 tablet  3 tablet Oral QHS Alwyn Ren, MD       tamsulosin [facility]) capsule 0.4 mg  0.4 mg Oral QPC supper Therisa Doyne, MD        Allergies as of 12/02/2022 - Review Complete 12/02/2022  Allergen Reaction Noted   Prednisone Other (See Comments) 06/30/2013   Amoxicillin Other (See Comments) 10/19/2015   Ampicillin Other (See Comments) 04/10/2010   Apple fruit extract Itching 05/04/2017   Apple juice Itching 05/04/2017   Cherry extract Itching 05/04/2017   Fruit & vegetable daily [nutritional supplements] Itching 05/04/2017   Imitrex [sumatriptan] Other (See Comments) 03/29/2013   Nutritional supplements Itching 05/04/2017   Other Itching 09/25/2015   Peach [prunus persica] Itching 05/04/2017   Strawberry extract Itching 05/04/2017   Zoloft [sertraline hcl] Other (See Comments) 03/29/2013    Family History  Adopted: Yes   Problem Relation Age of Onset   Breast cancer Neg Hx     Social History   Socioeconomic History   Marital status: Married    Spouse name: Not on file   Number of children: 1 A   Years of education: MA   Highest education level: Not on file  Occupational History   Occupation: Doctor, general practice    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   Caffeine Use: quit in 02/2011.   Social Determinants of Health   Financial Resource Strain: Not on file  Food Insecurity: No Food Insecurity (12/03/2022)   Hunger Vital Sign    Worried About Running Out of Food in the Last Year: Never true    Ran Out of Food in the Last Year: Never true  Transportation Needs: No Transportation Needs (12/03/2022)   PRAPARE - Administrator, Civil Service (Medical): No    Lack of Transportation (Non-Medical): No  Physical Activity: Not on file  Stress: Not on file  Social Connections: Unknown (12/01/2022)   Received from [facility]   Social Network    Social Network: Not on file  Intimate Partner Violence: Not At Risk (12/03/2022)   Humiliation, Afraid, Rape, and Kick questionnaire    Fear of Current or Ex-Partner: No    Emotionally Abused: No    Physically Abused: No    Sexually Abused: No   Review of Systems: Gen: Denies fever, sweats or chills. No weight loss.  CV: Denies chest pain, palpitations or edema. Resp: Denies cough, shortness of breath of hemoptysis.  GI: See HPI.    GU : Denies urinary burning, blood in urine, increased  urinary frequency or incontinence. MS: Denies joint pain, muscles aches or weakness. Derm: Denies rash, itchiness, skin lesions or unhealing ulcers. Psych:  Anxiety and depression. Heme: Denies easy bruising, bleeding. Neuro:  Denies headaches, dizziness or paresthesias. Endo:  Denies any problems with DM, thyroid or adrenal function.  Physical Exam: Vital signs in last 24 hours: Temp:  [98.7 F (37.1 C)-99 F (37.2 C)] 98.7 F (37.1 C) (10/31 0408) Pulse Rate:  [80-86] 86 (10/31 0408) Resp:  [16-18] 16 (10/31 0408) BP: (117-136)/(60-63) 117/60 (10/31 0408) SpO2:  [94 %-96 %] 96 % (10/31 0408) Last BM Date : 12/03/22 General: 57 year old female in no acute distress. Head:  Normocephalic and atraumatic. Eyes:  No scleral icterus. Conjunctiva pink. Ears:  Normal auditory acuity. Nose:  No deformity, discharge or lesions. Mouth:  Dentition intact. No ulcers or lesions.  Neck:  Supple. No lymphadenopathy or thyromegaly.  Lungs: Breath sounds clear throughout. No wheezes, rhonchi or crackles.  Heart: Regular rate rhythm, no murmurs. Abdomen: Obese abdomen, soft.  Mild tenderness to the right mid to lower abdomen without rebound or guarding.  No LLQ tenderness.  No palpable mass.  No bruit.  Positive bowel sounds to all 4 quadrants. Rectal: Deferred. Musculoskeletal:  Symmetrical without gross deformities.  Pulses:  Normal pulses noted. Extremities:  Without clubbing or edema. Neurologic:  Alert and  oriented x 4. No focal deficits.  Skin:  Intact without significant lesions or rashes. Psych:  Alert and cooperative. Normal mood and affect.  Intake/Output from previous day: 10/30 0701 - 10/31 0700 In: 2383.9 [I.V.:1651.3; IV Piggyback:732.6] Out: 2500 [Urine:1550; Emesis/NG output:950] Intake/Output this shift: No intake/output data recorded.  Lab Results: Recent Labs    12/02/22 2020 12/03/22 0441 12/04/22 0418  WBC 9.2 11.2* 10.3  HGB 16.2* 14.9 15.0  HCT 48.7* 45.7 47.9*  PLT 219 209 179   BMET Recent Labs    12/02/22 2020 12/03/22 0441 12/04/22 0418  NA 133* 138 135  K 3.4* 3.0* 3.8  CL 99 103 105  CO2 27 24 19*   GLUCOSE 85 93 74  BUN 10 9 8   CREATININE 1.75* 1.46* 1.11*  CALCIUM 9.1 8.9 8.4*   LFT Recent Labs    12/04/22 0418  PROT 7.2  ALBUMIN 3.8  AST 23  ALT 12  ALKPHOS 72  BILITOT 0.8       Studies/Results: DG Abd 1 View  Result Date: 12/03/2022 CLINICAL DATA:  Intractable nausea and vomiting EXAM: ABDOMEN - 1 VIEW COMPARISON:  12/03/2022 FINDINGS: Two supine frontal views of the abdomen and pelvis are obtained. No bowel obstruction or ileus. Moderate gas and stool throughout the colon unchanged. No abdominal masses. Punctate right renal calculi are unchanged. Numerous phleboliths are seen within the pelvis. No acute bony abnormalities. IMPRESSION: 1. Unremarkable bowel gas pattern.  No obstruction or ileus. 2. Moderate retained stool throughout the colon. 3. Stable right-sided nephrolithiasis. Electronically Signed   By: Sharlet Salina M.D.   On: 12/03/2022 21:02   DG Abd 1 View  Result Date: 12/03/2022 CLINICAL DATA:  Ileus EXAM: ABDOMEN - 1 VIEW COMPARISON:  12/02/2022 FINDINGS: Nonobstructive bowel gas pattern. Moderate volume stool throughout the colon. Cholecystectomy clips. Multiple small stones project over the right renal shadow. IMPRESSION: 1. Nonobstructive bowel gas pattern. 2. Right nephrolithiasis. 3. Moderate volume stool throughout the colon. Electronically Signed   By: Duanne Guess D.O.   On: 12/03/2022 16:52    IMPRESSION/PLAN:  57 year old female admitted to the hospital with bilateral flank pain  and RLQ pain. WBC 11.2 -> 10.3. Cr 1.75 -> 1.46 -> 1.11. CTAP without contrast 10/29 at [facility] showed nonobstructing kidney stones without evidence of a bowel obstruction.  Adhesions from multiple abdominal surgeries may be a contributing factor. -Consider repeat CTAP with oral and IV contrast as renal status has improved since admission -Pain management per the hospitalist  Intractable nausea and vomiting. CTAP without contrast 10/29 at [facility] showed  nonobstructing kidney stones without evidence of a bowel obstruction. Past cholecystectomy. On Ozempic x 2 years. Normal LFTs and lipase level.  -Clear liquid diet -Continue Pantoprazole 40 mg IV twice daily -Continue Ondansetron 4 mg IV every 6 hours as needed -Continue Promethazine 25 mg IV every 6 hours as needed -Consider scheduled Reglan 5 mg p.o. every 8 hours if nausea/vomiting persists -Recommend stopping Ozempic -Consider EGD during this hospital admission, await further recommendations per Dr. Leone Payor  Dysphagia/globus sensation  -EGD as an outpatient versus during this hospitalization  Constipation -Miralax nightly as needed   Rectal bleeding, patient endorsed seeing specks of bright red blood on the stool yesterday after passing several loose stools.  Normal colonoscopy 08/2022 is reassuring.  Arnaldo Natal  12/04/2022, 10:45 AM   Gastroenterology attending:  I have seen and evaluated the patient as well, chart reviewed. The patient has a clinical scenario of persistent nausea and vomiting of unclear etiology.  Onset with right flank and lower quadrant pain sounds like nephrolithiasis but we do not have evidence for ureteral stones on imaging and though she had dip positive hemoglobin there are no red blood cells in her urine.  No CVA tenderness on my exam today.  We are going to workup further with CT of the abdomen and pelvis with contrast and then likely EGD tomorrow.  Last dose of Ozempic was a week and a half ago so acceptable to perform EGD with deep sedation in that setting.  Iva Boop, MD, [facility] See Loretha Stapler on call - gastroenterology for best contact person 12/04/2022 4:59 PM

## 2022-12-04 NOTE — Progress Notes (Signed)
SATURATION QUALIFICATIONS: (This note is used to comply with regulatory documentation for home oxygen)  Patient Saturations on Room Air at Rest = 97%  Patient Saturations on Room Air while Ambulating = 97%  Patient Saturations on 0 Liters of oxygen while Ambulating = 97%  Patient does not require oxygen at this time.

## 2022-12-04 NOTE — Progress Notes (Signed)
Notified MD and Gastroenterology that patient was throwing up 100 mL of green emesis that looked like bile. RN asked if patient could get IV compazine to help with nausea/vomiting as PRN Zofran does not work for patient and it was not time for patient to get PRN IV Phenergan. An order was put in for a one time dose of IV Compazine 5 mg. An order was also put in for continuous fluids (0.9% sodium chloride at 100 mL/hr) since patient has not been able to keep anything down and to help with hydration.

## 2022-12-05 ENCOUNTER — Inpatient Hospital Stay (HOSPITAL_COMMUNITY): Payer: Medicare Other | Admitting: Certified Registered Nurse Anesthetist

## 2022-12-05 ENCOUNTER — Encounter (HOSPITAL_COMMUNITY): Payer: Self-pay | Admitting: Internal Medicine

## 2022-12-05 ENCOUNTER — Encounter (HOSPITAL_COMMUNITY)
Admission: EM | Disposition: A | Discharge status: Home / Self Care | Payer: Self-pay | Source: Home / Self Care | Attending: Internal Medicine

## 2022-12-05 DIAGNOSIS — R1115 Cyclical vomiting syndrome unrelated to migraine: Secondary | ICD-10-CM | POA: Diagnosis not present

## 2022-12-05 DIAGNOSIS — K2289 Other specified disease of esophagus: Secondary | ICD-10-CM | POA: Diagnosis not present

## 2022-12-05 DIAGNOSIS — N200 Calculus of kidney: Secondary | ICD-10-CM | POA: Diagnosis not present

## 2022-12-05 HISTORY — PX: ESOPHAGOGASTRODUODENOSCOPY (EGD) WITH PROPOFOL: SHX5813

## 2022-12-05 LAB — BASIC METABOLIC PANEL
Anion gap: 10 (ref 5–15)
BUN: 8 mg/dL (ref 6–20)
CO2: 22 mmol/L (ref 22–32)
Calcium: 8.6 mg/dL — ABNORMAL LOW (ref 8.9–10.3)
Chloride: 104 mmol/L (ref 98–111)
Creatinine, Ser: 1.05 mg/dL — ABNORMAL HIGH (ref 0.44–1.00)
GFR, Estimated: >60 mL/min (ref >60)
Glucose, Bld: 61 mg/dL — ABNORMAL LOW (ref 70–99)
Potassium: 3.7 mmol/L (ref 3.5–5.1)
Sodium: 136 mmol/L (ref 135–145)

## 2022-12-05 LAB — CBC
HCT: 41.4 % (ref 36.0–46.0)
Hemoglobin: 14.1 g/dL (ref 12.0–15.0)
MCH: 31.2 pg (ref 26.0–34.0)
MCHC: 34.1 g/dL (ref 30.0–36.0)
MCV: 91.6 fL (ref 80.0–100.0)
Platelets: 152 10*3/uL (ref 150–400)
RBC: 4.52 MIL/uL (ref 3.87–5.11)
RDW: 15 % (ref 11.5–15.5)
WBC: 7.6 10*3/uL (ref 4.0–10.5)
nRBC: 0 % (ref 0.0–0.2)

## 2022-12-05 SURGERY — ESOPHAGOGASTRODUODENOSCOPY (EGD) WITH PROPOFOL
Anesthesia: Monitor Anesthesia Care

## 2022-12-05 MED ORDER — SODIUM CHLORIDE 0.9 % IV SOLN: INTRAVENOUS | Status: DC | PRN

## 2022-12-05 MED ORDER — PROPOFOL 500 MG/50ML IV EMUL
INTRAVENOUS | Status: DC | PRN
  Administered 2022-12-05: 125 ug/kg/min via INTRAVENOUS

## 2022-12-05 MED ORDER — CIPROFLOXACIN HCL 250 MG PO TABS
250.0000 mg | ORAL_TABLET | Freq: Two times a day (BID) | ORAL | Status: DC
  Administered 2022-12-05 – 2022-12-06 (×2): 250 mg via ORAL
  Filled 2022-12-05 (×2): qty 1

## 2022-12-05 MED ORDER — DEXTROSE 5 % AND 0.9 % NACL IV BOLUS
1000.0000 mL | Freq: Once | INTRAVENOUS | Status: AC
  Administered 2022-12-05: 1000 mL via INTRAVENOUS

## 2022-12-05 MED ORDER — PROPOFOL 10 MG/ML IV BOLUS
INTRAVENOUS | Status: DC | PRN
  Administered 2022-12-05: 20 mg via INTRAVENOUS

## 2022-12-05 SURGICAL SUPPLY — 15 items

## 2022-12-05 NOTE — Anesthesia Preprocedure Evaluation (Addendum)
Anesthesia Evaluation  Patient identified by MRN, date of birth, ID band Patient awake    Reviewed: Allergy & Precautions, NPO status , Patient's Chart, lab work & pertinent test results  History of Anesthesia Complications (+) PONV and history of anesthetic complications  Airway Mallampati: II  TM Distance: >3 FB Neck ROM: Full    Dental no notable dental hx. (+) Teeth Intact, Dental Advisory Given   Pulmonary neg pulmonary ROS   Pulmonary exam normal breath sounds clear to auscultation       Cardiovascular negative cardio ROS Normal cardiovascular exam Rhythm:Regular Rate:Normal     Neuro/Psych  PSYCHIATRIC DISORDERS Anxiety Depression    negative neurological ROS     GI/Hepatic negative GI ROS, Neg liver ROS,,,  Endo/Other  Hypothyroidism    Renal/GU   negative genitourinary   Musculoskeletal  (+) Arthritis ,    Abdominal   Peds  Hematology Lab Results      Component                Value               Date                      WBC                      7.6                 12/05/2022                HGB                      14.1                12/05/2022                HCT                      41.4                12/05/2022                MCV                      91.6                12/05/2022                PLT                      152                 12/05/2022              Anesthesia Other Findings All: See list  Reproductive/Obstetrics negative OB ROS                             Anesthesia Physical Anesthesia Plan  ASA: 2  Anesthesia Plan: MAC   Post-op Pain Management: Minimal or no pain anticipated   Induction:   PONV Risk Score and Plan: Propofol infusion  Airway Management Planned: Natural Airway and Nasal Cannula  Additional Equipment: None  Intra-op Plan:   Post-operative Plan:   Informed Consent: I have reviewed the patients History and Physical, chart, labs  and discussed the procedure including the risks,  benefits and alternatives for the proposed anesthesia with the patient or authorized representative who has indicated his/her understanding and acceptance.     Dental advisory given  Plan Discussed with: CRNA  Anesthesia Plan Comments:        Anesthesia Quick Evaluation

## 2022-12-05 NOTE — Anesthesia Postprocedure Evaluation (Signed)
Anesthesia Post Note  Patient: Cynthia [name]  Procedure(s) Performed: ESOPHAGOGASTRODUODENOSCOPY (EGD) WITH PROPOFOL     Patient location during evaluation: Endoscopy Anesthesia Type: MAC Level of consciousness: awake and alert Pain management: pain level controlled Vital Signs Assessment: post-procedure vital signs reviewed and stable Respiratory status: spontaneous breathing, nonlabored ventilation, respiratory function stable and patient connected to nasal cannula oxygen Cardiovascular status: blood pressure returned to baseline and stable Postop Assessment: no apparent nausea or vomiting Anesthetic complications: no   No notable events documented.  Last Vitals:  Vitals:   12/05/22 1510 12/05/22 1520  BP: (!) 106/59 (!) 116/50  Pulse: 71 76  Resp: 10 (!) 9  Temp:    SpO2: 100% 98%    Last Pain:  Vitals:   12/05/22 1520  TempSrc:   PainSc: 0-No pain                 Trevor Iha

## 2022-12-05 NOTE — Interval H&P Note (Signed)
History and Physical Interval Note:  12/05/2022 2:25 PM  Cynthia [name]  has presented today for surgery, with the diagnosis of Nausea and vomiting.  The various methods of treatment have been discussed with the patient and family. After consideration of risks, benefits and other options for treatment, the patient has consented to  Procedure(s): ESOPHAGOGASTRODUODENOSCOPY (EGD) WITH PROPOFOL (N/A) as a surgical intervention.  The patient's history has been reviewed, patient examined, no change in status, stable for surgery.  I have reviewed the patient's chart and labs.  Questions were answered to the patient's satisfaction.     Stan Head

## 2022-12-05 NOTE — Plan of Care (Signed)
  Problem: Clinical Measurements: Goal: Ability to maintain clinical measurements within normal limits will improve Outcome: Progressing Goal: Will remain free from infection Outcome: Progressing Goal: Diagnostic test results will improve Outcome: Progressing   Problem: Nutrition: Goal: Adequate nutrition will be maintained Outcome: Progressing   Problem: Coping: Goal: Level of anxiety will decrease Outcome: Progressing   Problem: Elimination: Goal: Will not experience complications related to bowel motility Outcome: Progressing   Problem: Pain Management: Goal: General experience of comfort will improve Outcome: Progressing   Problem: Safety: Goal: Ability to remain free from injury will improve Outcome: Progressing

## 2022-12-05 NOTE — Progress Notes (Signed)
PROGRESS NOTE    Cynthia [name]  UXL:[id number] DOB: 08-Aug-1965 DOA: 12/02/2022 PCP: Kaleen Mask, MD   Brief Narrative:  57 year old female with history of kidney stones admitted with bilateral flank pain and nausea and vomiting.  History of kidney stones and bilateral ureteral stent placements in the past.  CT on admission shows bilateral kidney stones which are nonobstructive.  Her creatinine is elevated 1.75 on admission.  She was seen by Dr. Alvester Morin. She denied fever or chills dysuria hematuria.  She denied chest pain shortness of breath cough fever. She complains of being dizzy  She was being treated as an outpatient by her PCP with Toradol and Flomax with no improvement.  Other past medical history includes hyperlipidemia hypothyroidism and obesity. 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 Daphine Deutscher CT abdomen with moderate stool with colonic diverticulosis, bilateral nonobstructive renal calculi  Assessment & Plan:   Principal Problem:   Kidney stone Active Problems:   Hyperlipemia   Hypothyroidism   Obesity   AKI (acute kidney injury) (HCC)   Hypokalemia   #1 bilateral flank pain CT done outside hospital done 10/29 shows 8 mm stone in the right and stone in the left nonobstructing.  Seen by urology no intervention planned in hospital admission Urine culture done at the outpatient office positive for Enterococcus sensitive to Cipro this was done on December 02, 2022 Will arrange for outpatient follow-up.    #2  Intractable nausea and vomiting not responding to Zofran, Phenergan Continue Phenergan and IV fluids On Lamictal dose is not increased recently she has been on the same dose for 10 years(nausea vomiting side effect) on hold On Ozempic for 2 years on hold KUB moderate stool throughout the colon Appreciate GI input ?  Possible EGD  #3 moderate amount of stool in the colon though she does not report constipation-she had bowel movements with stool softeners  #4 hypokalemia  repleted  #5 depression on Lamictal on hold    Estimated body mass index is 34.3 kg/m as calculated from the following:   Height as of this encounter: 5\' 7"  (1.702 m).   Weight as of this encounter: 99.3 kg.  DVT prophylaxis: Lovenox  code Status: Full code Family Communication: Husband at bedside  disposition Plan:  Status is: Inpatient Remains inpatient appropriate because: Intractable nausea vomiting   Consultants:  None discussed with general surgery over the phone  Procedures: None Antimicrobials: None  Subjective: Hypoglycemic and IV fluids changed to D5 NS CT abdomen with contrast yesterday- Small bilateral nonobstructing renal calculi. Mild predominantly anterior urinary bladder wall thickening which may represent sequelae associated with cystitis. Correlation with urinalysis is recommended. Evidence of prior cholecystectomy, appendectomy and hysterectomy Objective: Vitals:   12/04/22 0408 12/04/22 1323 12/04/22 1959 12/05/22 0512  BP: 117/60 110/66 135/61 120/70  Pulse: 86 81 74 85  Resp: 16 18 18    Temp: 98.7 F (37.1 C) 98.7 F (37.1 C) 98.6 F (37 C) 98.2 F (36.8 C)  TempSrc: Oral Oral Oral Oral  SpO2: 96% 97% 96% 98%  Weight:      Height:        Intake/Output Summary (Last 24 hours) at 12/05/2022 1252 Last data filed at 12/05/2022 1100 Gross per 24 hour  Intake 1459.62 ml  Output 2250 ml  Net -790.38 ml   Filed Weights   12/02/22 1740  Weight: 99.3 kg    Examination:  General exam: Appears in no acute distress  respiratory system: Clear to auscultation. Respiratory effort normal.  Cardiovascular system: S1 & S2 heard, RRR. No JVD, murmurs, rubs, gallops or clicks. No pedal edema. Gastrointestinal system: Abdomen is nondistended, soft and right lower quadrant tender. No organomegaly or masses felt. Normal bowel sounds heard. Central nervous system: Alert and oriented. No focal neurological deficits. Extremities: Symmetric 5 x 5 power.  Data  Reviewed: I have personally reviewed following labs and imaging studies  CBC: Recent Labs  Lab 12/02/22 2020 12/03/22 0441 12/04/22 0418 12/05/22 0427  WBC 9.2 11.2* 10.3 7.6  NEUTROABS 6.5  --   --   --   HGB 16.2* 14.9 15.0 14.1  HCT 48.7* 45.7 47.9* 41.4  MCV 92.1 92.9 96.8 91.6  PLT 219 209 179 152   Basic Metabolic Panel: Recent Labs  Lab 12/02/22 2020 12/03/22 0441 12/04/22 0418 12/05/22 0427  NA 133* 138 135 136  K 3.4* 3.0* 3.8 3.7  CL 99 103 105 104  CO2 27 24 19* 22  GLUCOSE 85 93 74 61*  BUN 10 9 8 8   CREATININE 1.75* 1.46* 1.11* 1.05*  CALCIUM 9.1 8.9 8.4* 8.6*  MG  --  2.0 2.2  --   PHOS  --  1.8*  --   --    GFR: Estimated Creatinine Clearance: 73.3 mL/min (A) (by C-G formula based on SCr of 1.05 mg/dL (H)). Liver Function Tests: Recent Labs  Lab 12/02/22 2020 12/03/22 0441 12/04/22 0418  AST 27 27 23   ALT 14 14 12   ALKPHOS 73 74 72  BILITOT 0.6 0.7 0.8  PROT 7.7 7.4 7.2  ALBUMIN 4.2 4.0 3.8   Recent Labs  Lab 12/02/22 2020  LIPASE 48   No results for input(s): "AMMONIA" in the last 168 hours. Coagulation Profile: No results for input(s): "INR", "PROTIME" in the last 168 hours. Cardiac Enzymes: Recent Labs  Lab 12/03/22 0411  CKTOTAL 78   BNP (last 3 results) No results for input(s): "PROBNP" in the last 8760 hours. HbA1C: No results for input(s): "HGBA1C" in the last 72 hours. CBG: Recent Labs  Lab 12/03/22 1653  GLUCAP 90   Lipid Profile: No results for input(s): "CHOL", "HDL", "LDLCALC", "TRIG", "CHOLHDL", "LDLDIRECT" in the last 72 hours. Thyroid Function Tests: Recent Labs    12/03/22 0441  TSH 2.112   Anemia Panel: No results for input(s): "VITAMINB12", "FOLATE", "FERRITIN", "TIBC", "IRON", "RETICCTPCT" in the last 72 hours. Sepsis Labs: No results for input(s): "PROCALCITON", "LATICACIDVEN" in the last 168 hours.  No results found for this or any previous visit (from the past 240 hour(s)).    Radiology  Studies: CT ABDOMEN PELVIS W CONTRAST  Result Date: 12/05/2022 CLINICAL DATA:  Right lower quadrant pain with nausea and vomiting. EXAM: CT ABDOMEN AND PELVIS WITH CONTRAST TECHNIQUE: Multidetector CT imaging of the abdomen and pelvis was performed using the standard protocol following bolus administration of intravenous contrast. RADIATION DOSE REDUCTION: This exam was performed according to the departmental dose-optimization program which includes automated exposure control, adjustment of the mA and/or kV according to patient size and/or use of iterative reconstruction technique. CONTRAST:  OMNIPAQUE IOHEXOL 300 MG/ML  SOLN COMPARISON:  December 05, 2020 FINDINGS: Lower chest: No acute abnormality. Hepatobiliary: No focal liver abnormality is seen. Status post cholecystectomy. No biliary dilatation. Pancreas: Stable shortening of the pancreatic tail is noted. No pancreatic ductal dilatation or surrounding inflammatory changes. Spleen: Normal in size without focal abnormality. Adrenals/Urinary Tract: Adrenal glands are unremarkable. Kidneys are normal in size, without obstructing renal calculi, focal lesion, or hydronephrosis. 1 mm  and 2 mm nonobstructing renal calculi are seen within the right kidney. A 1 mm nonobstructing renal calculus is seen within the upper pole of the left kidney. Mild predominantly anterior urinary bladder wall thickening is seen. Stomach/Bowel: Stomach is within normal limits. The appendix is surgically absent. No evidence of bowel wall thickening, distention, or inflammatory changes. Vascular/Lymphatic: No significant vascular findings are present. No enlarged abdominal or pelvic lymph nodes. Reproductive: Status post hysterectomy. No adnexal masses. Other: No abdominal wall hernia or abnormality. No abdominopelvic ascites. Musculoskeletal: No acute or significant osseous findings. IMPRESSION: 1. Small bilateral nonobstructing renal calculi. 2. Mild predominantly anterior urinary  bladder wall thickening which may represent sequelae associated with cystitis. Correlation with urinalysis is recommended. 3. Evidence of prior cholecystectomy, appendectomy and hysterectomy. Electronically Signed   By: Aram Candela M.D.   On: 12/05/2022 00:44   DG Abd 1 View  Result Date: 12/03/2022 CLINICAL DATA:  Intractable nausea and vomiting EXAM: ABDOMEN - 1 VIEW COMPARISON:  12/03/2022 FINDINGS: Two supine frontal views of the abdomen and pelvis are obtained. No bowel obstruction or ileus. Moderate gas and stool throughout the colon unchanged. No abdominal masses. Punctate right renal calculi are unchanged. Numerous phleboliths are seen within the pelvis. No acute bony abnormalities. IMPRESSION: 1. Unremarkable bowel gas pattern.  No obstruction or ileus. 2. Moderate retained stool throughout the colon. 3. Stable right-sided nephrolithiasis. Electronically Signed   By: Sharlet Salina M.D.   On: 12/03/2022 21:02     Scheduled Meds:  bisacodyl  10 mg Oral Daily   ciprofloxacin  250 mg Oral BID   dicyclomine  10 mg Oral TID AC & HS   levothyroxine  88 mcg Oral QHS   metoCLOPramide (REGLAN) injection  10 mg Intravenous Q6H   pantoprazole (PROTONIX) IV  40 mg Intravenous Q12H   polyethylene glycol  17 g Oral Daily   rosuvastatin  40 mg Oral Daily   senna-docusate  3 tablet Oral QHS   tamsulosin  0.4 mg Oral QPC supper   Continuous Infusions:  promethazine (PHENERGAN) injection (IM or IVPB) 25 mg (12/04/22 1947)     LOS: 3 days    Time spent: 38 min Alwyn Ren, MD 12/05/2022, 12:52 PM

## 2022-12-05 NOTE — Progress Notes (Signed)
[facility] Progress Note  CC:   Intractable nausea and vomiting   Subjective: She is feeling much better this morning. Nausea has significantly improved after receiving Reglan every 6 hours.  No vomiting this morning.  She continues to have mild LLQ, which is not any worse when compared to yesterday.  She remains n.p.o. for EGD this afternoon.  Husband at the bedside.   Objective:  Vital signs in last 24 hours: Temp:  [98.2 F (36.8 C)-98.7 F (37.1 C)] 98.2 F (36.8 C) (11/01 0512) Pulse Rate:  [74-85] 85 (11/01 0512) Resp:  [18] 18 (10/31 1959) BP: (110-135)/(61-70) 120/70 (11/01 0512) SpO2:  [96 %-98 %] 98 % (11/01 0512) Last BM Date : 10/30/24r General: 57 year old female in no acute distress. Heart: Rate and rhythm, no murmurs. Pulm: Breath sounds clear throughout. Abdomen: Mild RLQ tenderness without rebound or guarding.  Positive bowel sounds all 4 quadrants. Extremities: No lower extremity edema. Neurologic:  Alert and  oriented x 4. Grossly normal neurologically. Psych:  Alert and cooperative. Normal mood and affect.  Intake/Output from previous day: 10/31 0701 - 11/01 0700 In: 1363.9 [P.O.:120; I.V.:1143.9; IV Piggyback:100] Out: 1850 [Urine:1750; Emesis/NG output:100] Intake/Output this shift: Total I/O In: 215.7 [I.V.:215.7] Out: 350 [Urine:350]  Lab Results: Recent Labs    12/03/22 0441 12/04/22 0418 12/05/22 0427  WBC 11.2* 10.3 7.6  HGB 14.9 15.0 14.1  HCT 45.7 47.9* 41.4  PLT 209 179 152   BMET Recent Labs    12/03/22 0441 12/04/22 0418 12/05/22 0427  NA 138 135 136  K 3.0* 3.8 3.7  CL 103 105 104  CO2 24 19* 22  GLUCOSE 93 74 61*  BUN 9 8 8   CREATININE 1.46* 1.11* 1.05*  CALCIUM 8.9 8.4* 8.6*   LFT Recent Labs    12/04/22 0418  PROT 7.2  ALBUMIN 3.8  AST 23  ALT 12  ALKPHOS 72  BILITOT 0.8   PT/INR No results for input(s): "LABPROT", "INR" in the last 72 hours. Hepatitis Panel No results for input(s):  "HEPBSAG", "HCVAB", "HEPAIGM", "HEPBIGM" in the last 72 hours.  CT ABDOMEN PELVIS W CONTRAST  Result Date: 12/05/2022 CLINICAL DATA:  Right lower quadrant pain with nausea and vomiting. EXAM: CT ABDOMEN AND PELVIS WITH CONTRAST TECHNIQUE: Multidetector CT imaging of the abdomen and pelvis was performed using the standard protocol following bolus administration of intravenous contrast. RADIATION DOSE REDUCTION: This exam was performed according to the departmental dose-optimization program which includes automated exposure control, adjustment of the mA and/or kV according to patient size and/or use of iterative reconstruction technique. CONTRAST:  OMNIPAQUE IOHEXOL 300 MG/ML  SOLN COMPARISON:  December 05, 2020 FINDINGS: Lower chest: No acute abnormality. Hepatobiliary: No focal liver abnormality is seen. Status post cholecystectomy. No biliary dilatation. Pancreas: Stable shortening of the pancreatic tail is noted. No pancreatic ductal dilatation or surrounding inflammatory changes. Spleen: Normal in size without focal abnormality. Adrenals/Urinary Tract: Adrenal glands are unremarkable. Kidneys are normal in size, without obstructing renal calculi, focal lesion, or hydronephrosis. 1 mm and 2 mm nonobstructing renal calculi are seen within the right kidney. A 1 mm nonobstructing renal calculus is seen within the upper pole of the left kidney. Mild predominantly anterior urinary bladder wall thickening is seen. Stomach/Bowel: Stomach is within normal limits. The appendix is surgically absent. No evidence of bowel wall thickening, distention, or inflammatory changes. Vascular/Lymphatic: No significant vascular findings are present. No enlarged abdominal or pelvic lymph nodes. Reproductive: Status post hysterectomy. No  adnexal masses. Other: No abdominal wall hernia or abnormality. No abdominopelvic ascites. Musculoskeletal: No acute or significant osseous findings. IMPRESSION: 1. Small bilateral  nonobstructing renal calculi. 2. Mild predominantly anterior urinary bladder wall thickening which may represent sequelae associated with cystitis. Correlation with urinalysis is recommended. 3. Evidence of prior cholecystectomy, appendectomy and hysterectomy. Electronically Signed   By: Aram Candela M.D.   On: 12/05/2022 00:44   DG Abd 1 View  Result Date: 12/03/2022 CLINICAL DATA:  Intractable nausea and vomiting EXAM: ABDOMEN - 1 VIEW COMPARISON:  12/03/2022 FINDINGS: Two supine frontal views of the abdomen and pelvis are obtained. No bowel obstruction or ileus. Moderate gas and stool throughout the colon unchanged. No abdominal masses. Punctate right renal calculi are unchanged. Numerous phleboliths are seen within the pelvis. No acute bony abnormalities. IMPRESSION: 1. Unremarkable bowel gas pattern.  No obstruction or ileus. 2. Moderate retained stool throughout the colon. 3. Stable right-sided nephrolithiasis. Electronically Signed   By: Sharlet Salina M.D.   On: 12/03/2022 21:02   DG Abd 1 View  Result Date: 12/03/2022 CLINICAL DATA:  Ileus EXAM: ABDOMEN - 1 VIEW COMPARISON:  12/02/2022 FINDINGS: Nonobstructive bowel gas pattern. Moderate volume stool throughout the colon. Cholecystectomy clips. Multiple small stones project over the right renal shadow. IMPRESSION: 1. Nonobstructive bowel gas pattern. 2. Right nephrolithiasis. 3. Moderate volume stool throughout the colon. Electronically Signed   By: Duanne Guess D.O.   On: 12/03/2022 16:52    Assessment / Plan:  57 year old female admitted to the hospital with bilateral flank pain and RLQ pain. WBC 11.2 -> 10.3-> 7.6. Cr 1.75 -> 1.46 -> 1.11 -> 1.05. CTAP without contrast 10/29 at [facility] showed nonobstructing kidney stones without evidence of a bowel obstruction. CTAP with oral and IV contrast 10/31 showed small non obstructing renal stones and bladder wall thickening and a normal colon. Adhesions from multiple abdominal  surgeries may be a contributing factor. Started on Cipro for suspected UTI. -Pain management per the hospitalist   Intractable nausea and vomiting. CTAP without contrast 10/29 at [facility] showed nonobstructing kidney stones without evidence of a bowel obstruction.  CTAP with oral and IV contrast yesterday showed small bilateral nonobstructing renal calculi and urinary bladder wall thickening and a normal bowel.  Past cholecystectomy. On Ozempic x 2 years. Normal LFTs and lipase level.  -NPO -Proceed with EGD today with Dr. Leone Payor, benefits and risks discussed including risk with sedation, risk of bleeding, perforation and infection  -Continue D5NS IV @ 100cc/hr  -Continue Pantoprazole 40 mg IV twice daily -Continue Ondansetron 4 mg IV every 6 hours as needed -Continue Promethazine 25 mg IV every 6 hours as needed -Continue Reglan 10mg  IV Q 6 hrs next 24 hours -Recommend stopping Ozempic   Dysphagia/globus sensation  -EGD today as noted above    Constipation -Miralax nightly as needed    Rectal bleeding, patient endorsed seeing specks of bright red blood on the stool yesterday after passing several loose stools.  Normal colonoscopy 08/2022 is reassuring.  Principal Problem:   Kidney stone Active Problems:   Hyperlipemia   Hypothyroidism   Obesity   AKI (acute kidney injury) (HCC)   Hypokalemia     LOS: 3 days   Arnaldo Natal  12/05/2022, 11:24AM

## 2022-12-05 NOTE — Plan of Care (Signed)
  Problem: Clinical Measurements: Goal: Will remain free from infection Outcome: Progressing Goal: Diagnostic test results will improve Outcome: Progressing Goal: Respiratory complications will improve Outcome: Progressing   Problem: Activity: Goal: Risk for activity intolerance will decrease Outcome: Progressing   Problem: Nutrition: Goal: Adequate nutrition will be maintained Outcome: Progressing   

## 2022-12-05 NOTE — Transfer of Care (Signed)
Immediate Anesthesia Transfer of Care Note  Patient: Cynthia [name]  Procedure(s) Performed: ESOPHAGOGASTRODUODENOSCOPY (EGD) WITH PROPOFOL  Patient Location: Endoscopy Unit  Anesthesia Type:MAC  Level of Consciousness: awake and patient cooperative  Airway & Oxygen Therapy: Patient Spontanous Breathing and Patient connected to face mask  Post-op Assessment: Report given to RN and Post -op Vital signs reviewed and stable  Post vital signs: Reviewed and stable  Last Vitals:  Vitals Value Taken Time  BP    Temp    Pulse 99 12/05/22 1444  Resp 25 12/05/22 1444  SpO2 97 % 12/05/22 1444  Vitals shown include unfiled device data.  Last Pain:  Vitals:   12/05/22 1300  TempSrc:   PainSc: 0-No pain      Patients Stated Pain Goal: 4 (12/05/22 1102)  Complications: No notable events documented.

## 2022-12-05 NOTE — Anesthesia Procedure Notes (Signed)
Procedure Name: MAC Date/Time: 12/05/2022 2:30 PM  Performed by: Vanessa [place], CRNAPre-anesthesia Checklist: Patient identified, Emergency Drugs available, Suction available and Patient being monitored Patient Re-evaluated:Patient Re-evaluated prior to induction Oxygen Delivery Method: Simple face mask

## 2022-12-05 NOTE — Op Note (Signed)
[facility] Patient Name: Cynthia [name] Procedure Date: 12/05/2022 MRN: [id number] Attending MD: Iva Boop , MD, [id number] Date of Birth: 11/13/1965 CSN: [id number] Age: 57 Admit Type: Outpatient Procedure:                Upper GI endoscopy Indications:              Persistent vomiting Providers:                Iva Boop, MD, Suzy Bouchard, RN, Doristine Mango, RN, Geoffery Lyons, Technician, Harrington Challenger,                            Technician Referring MD:              Medicines:                Monitored Anesthesia Care Complications:            No immediate complications. Estimated Blood Loss:     Estimated blood loss: none. Procedure:                Pre-Anesthesia Assessment:                           - Prior to the procedure, a History and Physical                            was performed, and patient medications and                            allergies were reviewed. The patient's tolerance of                            previous anesthesia was also reviewed. The risks                            and benefits of the procedure and the sedation                            options and risks were discussed with the patient.                            All questions were answered, and informed consent                            was obtained. Prior Anticoagulants: The patient has                            taken no anticoagulant or antiplatelet agents. ASA                            Grade Assessment: II - A patient with mild systemic  disease. After reviewing the risks and benefits,                            the patient was deemed in satisfactory condition to                            undergo the procedure.                           After obtaining informed consent, the endoscope was                            passed under direct vision. Throughout the                            procedure, the patient's blood  pressure, pulse, and                            oxygen saturations were monitored continuously. The                            [id number]-[id number] ([id number]) Olympus endoscope was introduced                            through the mouth, and advanced to the second part                            of duodenum. The upper GI endoscopy was                            accomplished without difficulty. The patient                            tolerated the procedure well. Scope In: Scope Out: Findings:      Patchy mild erythema was found in the distal esophagus.      Localized mucosal changes characterized by submucosal heme were found at       the pylorus.      The exam was otherwise without abnormality.      The cardia and gastric fundus were normal on retroflexion. Impression:               - Erythema in the distal esophagus.                           - Submucosal heme mucosa in the pylorus.                           - The examination was otherwise normal. I think the                            changes seen abover are traumatic from vomiting and                            not etiologic.                           -  No specimens collected. Moderate Sedation:      Not Applicable - Patient had care per Anesthesia. Recommendation:           - She is much better today                           CT was unrevealing except cystitis and we learned                            prior UCx + - no treatment                           would continue IV Reglan 10 mg q 6 and try to                            switch to po tomorrow                           Advance diet as tolerated from clear liquids and                            discharge when tolerating diet ok                           she does not need a GI appointment for this after dc                           signing off - call if ? Procedure Code(s):        --- Professional ---                           [phone], Esophagogastroduodenoscopy, flexible,                             transoral; diagnostic, including collection of                            specimen(s) by brushing or washing, when performed                            (separate procedure) Diagnosis Code(s):        --- Professional ---                           K22.89, Other specified disease of esophagus                           R11.15, Cyclical vomiting syndrome unrelated to                            migraine CPT copyright 2022 [employer]. All rights reserved. The codes documented in this report are preliminary and upon coder review may  be revised to meet current compliance requirements. Iva Boop, MD 12/05/2022 2:47:00 PM This report has been signed electronically. Number of Addenda: 0

## 2022-12-06 DIAGNOSIS — N200 Calculus of kidney: Secondary | ICD-10-CM | POA: Diagnosis not present

## 2022-12-06 MED ORDER — POLYETHYLENE GLYCOL 3350 17 G PO PACK: 17.0000 g | PACK | Freq: Every day | ORAL | 0 refills | Status: AC

## 2022-12-06 MED ORDER — PROMETHAZINE HCL 12.5 MG PO TABS: 12.5000 mg | ORAL_TABLET | Freq: Four times a day (QID) | ORAL | 0 refills | Status: AC | PRN

## 2022-12-06 MED ORDER — CIPROFLOXACIN HCL 250 MG PO TABS: 250.0000 mg | ORAL_TABLET | Freq: Two times a day (BID) | ORAL | 0 refills | Status: DC

## 2022-12-06 MED ORDER — SENNOSIDES-DOCUSATE SODIUM 8.6-50 MG PO TABS: 3.0000 | ORAL_TABLET | Freq: Every day | ORAL | Status: AC

## 2022-12-06 MED ORDER — CIPROFLOXACIN HCL 500 MG PO TABS: 500.0000 mg | ORAL_TABLET | Freq: Two times a day (BID) | ORAL | 0 refills | Status: AC

## 2022-12-06 NOTE — Plan of Care (Signed)
  Problem: Clinical Measurements: Goal: Ability to maintain clinical measurements within normal limits will improve Outcome: Progressing Goal: Will remain free from infection Outcome: Progressing Goal: Diagnostic test results will improve Outcome: Progressing   Problem: Nutrition: Goal: Adequate nutrition will be maintained Outcome: Progressing   

## 2022-12-06 NOTE — Discharge Summary (Signed)
Physician Discharge Summary  Cynthia [name] VZD:[id number] DOB: [birth year] DOA: 12/02/2022  PCP: Kaleen Mask, MD  Admit date: 12/02/2022 Discharge date: 12/06/2022  Admitted From: Home Disposition: Home  Recommendations for Outpatient Follow-up:  Follow up with PCP in 1-2 weeks Please obtain BMP/CBC in one week  Home Health: None Equipment/Devices: None  Discharge Condition: Stable CODE STATUS: Full code Diet recommendation: Cardiac  Brief/Interim Summary:57 year old female with history of kidney stones admitted with bilateral flank pain and nausea and vomiting.  History of kidney stones and bilateral ureteral stent placements in the past.  CT on admission shows bilateral kidney stones which are nonobstructive.  Her creatinine is elevated 1.75 on admission.  She was seen by Dr. Alvester Morin. She denied fever or chills dysuria hematuria.  She denied chest pain shortness of breath cough fever. She complains of being dizzy  She was being treated as an outpatient by her PCP with Toradol and Flomax with no improvement.  Other past medical history includes hyperlipidemia hypothyroidism and obesity. 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 Daphine Deutscher CT abdomen with moderate stool with colonic diverticulosis, bilateral nonobstructive renal calculi    Discharge Diagnoses:  Principal Problem:   Kidney stone Active Problems:   Hyperlipemia   Hypothyroidism   Obesity   AKI (acute kidney injury) (HCC)   Hypokalemia  #1 bilateral flank pain admitted with a diagnosis of bilateral kidney stones which probably would have been causing the bilateral flank pain.  She was seen by urology who did not think her pain is from the kidney stones as she had nonobstructive bilateral kidney stones she had no hematuria or urinary complaints.  She continued to have nausea vomiting and abdominal/flank pain.  GI was consulted who recommended EGD which did not reveal any acute findings.  CT done outside hospital done 10/29  shows 8 mm stone in the right and stone in the left nonobstructing.  Seen by urology no intervention planned in hospital admission Urine culture done at the outpatient office positive for Enterococcus sensitive to Cipro this was done on December 02, 2022.  Patient was treated with ciprofloxacin and discharged on ciprofloxacin 500 twice daily with improvement in her symptoms.   #2  Intractable nausea and vomiting-she was treated with supportive measures including Zofran and Phenergan Reglan and PPI.  And IV fluids.  She had EGD done by Dr. Leone Payor on 12/05/2022 with no acute findings.  Rest with her to stop taking Ozempic, and possibly Lamictal side effects.  She will follow-up with her primary doctor and decide whether she wants to be off of for Ozempic.  #3 moderate amount of stool in the colon though she does not report constipation-she had bowel movements with stool softeners   #4 hypokalemia repleted   #5 depression on Lamictal   #6 enterococcal UTI patient had urine culture done at the primary care doctor's office which grew Enterococcus sensitive to Cipro.    Estimated body mass index is 34.3 kg/m as calculated from the following:   Height as of this encounter: 5\' 7"  (1.702 m).   Weight as of this encounter: 99.3 kg.  Discharge Instructions  Discharge Instructions     Diet - low sodium heart healthy   Complete by: As directed    Increase activity slowly   Complete by: As directed       Allergies as of 12/06/2022       Reactions   Prednisone Other (See Comments)   Eye Irritation Other Reaction(s): insomnia, Other (See Comments)  Amoxicillin Other (See Comments)   Face, eyes, chest turned red.  Has tolerated Ancef.    Ampicillin Other (See Comments)   SERUM SICKNESS REACTION Has patient had a PCN reaction causing immediate rash, facial/tongue/throat swelling, SOB or lightheadedness with hypotension: Yes Has patient had a PCN reaction causing severe rash involving mucus  membranes or skin necrosis: No Has patient had a PCN reaction that required hospitalization: No Has patient had a PCN reaction occurring within the last 10 years: No If all of the above answers are "NO", then may proceed with Cephalosporin use..  TOLERATED ANCEF.   Apple Fruit Extract Itching   Mouth itches inside, but no breathing impairment; treats with Benadryl   Apple Juice Itching   Mouth itches inside, but no breathing impairment; treats with Benadryl   Cherry Extract Itching   Mouth itches inside, but no breathing impairment; treats with Benadryl   Fruit & Vegetable Daily [nutritional Supplements] Itching   FRUITS - Mouth itches inside, but no breathing impairment; treats with Benadryl   Imitrex [sumatriptan] Other (See Comments)   Chest Pain   Nutritional Supplements Itching   FRUITS - Mouth itches inside, but no breathing impairment; treats with Benadryl   Other Itching   Feline dander = Mouth itches inside, but no breathing impairment; treats with Benadryl Cat dander    Peach [prunus Persica] Itching   Mouth itches inside, but no breathing impairment; treats with Benadryl   Strawberry Extract Itching   Mouth itches inside, but no breathing impairment; treats with Benadryl   Zoloft [sertraline Hcl] Other (See Comments)   Causes hyperactivity        Medication List     TAKE these medications    acetaminophen 500 MG tablet Commonly known as: TYLENOL Take 1,000 mg by mouth daily as needed for moderate pain or headache.   ALPRAZolam 1 MG tablet Commonly known as: XANAX Take 0.5 mg by mouth 3 (three) times daily as needed for anxiety or sleep.   aspirin 81 MG chewable tablet Chew 81 mg by mouth daily.   ciprofloxacin 500 MG tablet Commonly known as: Cipro Take 1 tablet (500 mg total) by mouth 2 (two) times daily for 5 days.   DayVigo 10 MG Tabs Generic drug: Lemborexant Take 1 tablet by mouth at bedtime.   Doxepin HCl 6 MG Tabs Take 6 mg by mouth at bedtime  as needed for sleep.   estrogens-methylTEST 1.25-2.5 MG tablet Commonly known as: ESTRATEST Take 1 tablet by mouth at bedtime.   lamoTRIgine 200 MG tablet Commonly known as: LAMICTAL Take 400 mg by mouth at bedtime.   Levothyroxine Sodium 88 MCG Caps Take 88 mcg by mouth at bedtime.   lisinopril 2.5 MG tablet Commonly known as: ZESTRIL Take 2.5 mg by mouth daily.   metoprolol succinate 50 MG 24 hr tablet Commonly known as: TOPROL-XL Take 50 mg by mouth daily.   Nurtec 75 MG Tbdp Generic drug: Rimegepant Sulfate Take 1 tablet by mouth daily as needed.   Ozempic (2 MG/DOSE) 8 MG/3ML Sopn Generic drug: Semaglutide (2 MG/DOSE) Inject 2 mg as directed once a week.   polyethylene glycol 17 g packet Commonly known as: MIRALAX / GLYCOLAX Take 17 g by mouth daily.   promethazine 12.5 MG tablet Commonly known as: PHENERGAN Take 1 tablet (12.5 mg total) by mouth every 6 (six) hours as needed for nausea or vomiting.   Rhofade 1 % Crea Generic drug: Oxymetazoline HCl Apply 1 Application topically every morning.  rosuvastatin 40 MG tablet Commonly known as: CRESTOR Take 40 mg by mouth daily.   senna-docusate 8.6-50 MG tablet Commonly known as: Senokot-S Take 3 tablets by mouth at bedtime.   Sprix 15.75 MG/SPRAY Soln Generic drug: Ketorolac Tromethamine Place 1 spray into the nose every 6 (six) hours as needed (migrane pain).   tamsulosin 0.4 MG Caps capsule Commonly known as: FLOMAX Take 0.4 mg by mouth at bedtime.   Vitamin D 50 MCG (2000 UT) tablet Take 2,000 Units by mouth daily.   Vraylar 3 MG capsule Generic drug: cariprazine Take 3 mg by mouth daily.        Follow-up Information     Kaleen Mask, MD Follow up.   Specialty: Family Medicine Contact information: [address] [place] [address] [phone]         Crista Elliot, MD Follow up.   Specialty: Urology Contact information: [address] [place]  [address] [phone]                Allergies  Allergen Reactions   Prednisone Other (See Comments)    Eye Irritation  Other Reaction(s): insomnia, Other (See Comments)   Amoxicillin Other (See Comments)    Face, eyes, chest turned red.  Has tolerated Ancef.     Ampicillin Other (See Comments)    SERUM SICKNESS REACTION Has patient had a PCN reaction causing immediate rash, facial/tongue/throat swelling, SOB or lightheadedness with hypotension: Yes Has patient had a PCN reaction causing severe rash involving mucus membranes or skin necrosis: No Has patient had a PCN reaction that required hospitalization: No Has patient had a PCN reaction occurring within the last 10 years: No If all of the above answers are "NO", then may proceed with Cephalosporin use..  TOLERATED ANCEF.    Apple Fruit Extract Itching    Mouth itches inside, but no breathing impairment; treats with Benadryl   Apple Juice Itching    Mouth itches inside, but no breathing impairment; treats with Benadryl   Cherry Extract Itching    Mouth itches inside, but no breathing impairment; treats with Benadryl   Fruit & Vegetable Daily [Nutritional Supplements] Itching    FRUITS - Mouth itches inside, but no breathing impairment; treats with Benadryl   Imitrex [Sumatriptan] Other (See Comments)    Chest Pain   Nutritional Supplements Itching    FRUITS - Mouth itches inside, but no breathing impairment; treats with Benadryl   Other Itching    Feline dander = Mouth itches inside, but no breathing impairment; treats with Benadryl Cat dander    Peach [Prunus Persica] Itching    Mouth itches inside, but no breathing impairment; treats with Benadryl   Strawberry Extract Itching    Mouth itches inside, but no breathing impairment; treats with Benadryl   Zoloft [Sertraline Hcl] Other (See Comments)    Causes hyperactivity    Consultations:gi  urology    Procedures/Studies: CT ABDOMEN PELVIS W  CONTRAST  Result Date: 12/05/2022 CLINICAL DATA:  Right lower quadrant pain with nausea and vomiting. EXAM: CT ABDOMEN AND PELVIS WITH CONTRAST TECHNIQUE: Multidetector CT imaging of the abdomen and pelvis was performed using the standard protocol following bolus administration of intravenous contrast. RADIATION DOSE REDUCTION: This exam was performed according to the departmental dose-optimization program which includes automated exposure control, adjustment of the mA and/or kV according to patient size and/or use of iterative reconstruction technique. CONTRAST:  OMNIPAQUE IOHEXOL 300 MG/ML  SOLN COMPARISON:  December 05, 2020 FINDINGS: Lower chest: No acute abnormality. Hepatobiliary: No focal liver abnormality is seen. Status post cholecystectomy. No biliary dilatation. Pancreas: Stable shortening of the pancreatic tail is noted. No pancreatic ductal dilatation or surrounding inflammatory changes. Spleen: Normal in size without focal abnormality. Adrenals/Urinary Tract: Adrenal glands are unremarkable. Kidneys are normal in size, without obstructing renal calculi, focal lesion, or hydronephrosis. 1 mm and 2 mm nonobstructing renal calculi are seen within the right kidney. A 1 mm nonobstructing renal calculus is seen within the upper pole of the left kidney. Mild predominantly anterior urinary bladder wall thickening is seen. Stomach/Bowel: Stomach is within normal limits. The appendix is surgically absent. No evidence of bowel wall thickening, distention, or inflammatory changes. Vascular/Lymphatic: No significant vascular findings are present. No enlarged abdominal or pelvic lymph nodes. Reproductive: Status post hysterectomy. No adnexal masses. Other: No abdominal wall hernia or abnormality. No abdominopelvic ascites. Musculoskeletal: No acute or significant osseous findings. IMPRESSION: 1. Small bilateral nonobstructing renal calculi. 2. Mild predominantly anterior urinary bladder wall thickening which  may represent sequelae associated with cystitis. Correlation with urinalysis is recommended. 3. Evidence of prior cholecystectomy, appendectomy and hysterectomy. Electronically Signed   By: Aram Candela M.D.   On: 12/05/2022 00:44   DG Abd 1 View  Result Date: 12/03/2022 CLINICAL DATA:  Intractable nausea and vomiting EXAM: ABDOMEN - 1 VIEW COMPARISON:  12/03/2022 FINDINGS: Two supine frontal views of the abdomen and pelvis are obtained. No bowel obstruction or ileus. Moderate gas and stool throughout the colon unchanged. No abdominal masses. Punctate right renal calculi are unchanged. Numerous phleboliths are seen within the pelvis. No acute bony abnormalities. IMPRESSION: 1. Unremarkable bowel gas pattern.  No obstruction or ileus. 2. Moderate retained stool throughout the colon. 3. Stable right-sided nephrolithiasis. Electronically Signed   By: Sharlet Salina M.D.   On: 12/03/2022 21:02   DG Abd 1 View  Result Date: 12/03/2022 CLINICAL DATA:  Ileus EXAM: ABDOMEN - 1 VIEW COMPARISON:  12/02/2022 FINDINGS: Nonobstructive bowel gas pattern. Moderate volume stool throughout the colon. Cholecystectomy clips. Multiple small stones project over the right renal shadow. IMPRESSION: 1. Nonobstructive bowel gas pattern. 2. Right nephrolithiasis. 3. Moderate volume stool throughout the colon. Electronically Signed   By: Duanne Guess D.O.   On: 12/03/2022 16:52   (Echo, Carotid, EGD, Colonoscopy, ERCP)    Subjective:  Patient had a breakfast felt a little nauseous but did not throw up anxious to go home Discharge Exam: Vitals:   12/05/22 2057 12/06/22 0428  BP: 111/71 101/60  Pulse: 80 81  Resp: 20 16  Temp: 98 F (36.7 C) 98.2 F (36.8 C)  SpO2: 94% 93%   Vitals:   12/05/22 1520 12/05/22 1600 12/05/22 2057 12/06/22 0428  BP: (!) 116/50 132/82 111/71 101/60  Pulse: 76 77 80 81  Resp: (!) 9 15 20 16   Temp:   98 F (36.7 C) 98.2 F (36.8 C)  TempSrc:   Oral Oral  SpO2: 98% 96% 94%  93%  Weight:      Height:        General: Pt is alert, awake, not in acute distress Cardiovascular: RRR, S1/S2 +, no rubs, no gallops Respiratory: CTA bilaterally, no wheezing, no rhonchi Abdominal: Soft, NT, ND, bowel sounds + Extremities: no edema, no cyanosis    The results of significant diagnostics from this hospitalization (including imaging, microbiology, ancillary and laboratory) are listed below for reference.     Microbiology: No results found for this or any previous visit (  from the past 240 hour(s)).   Labs: BNP (last 3 results) No results for input(s): "BNP" in the last 8760 hours. Basic Metabolic Panel: Recent Labs  Lab 12/02/22 2020 12/03/22 0441 12/04/22 0418 12/05/22 0427  NA 133* 138 135 136  K 3.4* 3.0* 3.8 3.7  CL 99 103 105 104  CO2 27 24 19* 22  GLUCOSE 85 93 74 61*  BUN 10 9 8 8   CREATININE 1.75* 1.46* 1.11* 1.05*  CALCIUM 9.1 8.9 8.4* 8.6*  MG  --  2.0 2.2  --   PHOS  --  1.8*  --   --    Liver Function Tests: Recent Labs  Lab 12/02/22 2020 12/03/22 0441 12/04/22 0418  AST 27 27 23   ALT 14 14 12   ALKPHOS 73 74 72  BILITOT 0.6 0.7 0.8  PROT 7.7 7.4 7.2  ALBUMIN 4.2 4.0 3.8   Recent Labs  Lab 12/02/22 2020  LIPASE 48   No results for input(s): "AMMONIA" in the last 168 hours. CBC: Recent Labs  Lab 12/02/22 2020 12/03/22 0441 12/04/22 0418 12/05/22 0427  WBC 9.2 11.2* 10.3 7.6  NEUTROABS 6.5  --   --   --   HGB 16.2* 14.9 15.0 14.1  HCT 48.7* 45.7 47.9* 41.4  MCV 92.1 92.9 96.8 91.6  PLT 219 209 179 152   Cardiac Enzymes: Recent Labs  Lab 12/03/22 0411  CKTOTAL 78   BNP: Invalid input(s): "POCBNP" CBG: Recent Labs  Lab 12/03/22 1653  GLUCAP 90   D-Dimer No results for input(s): "DDIMER" in the last 72 hours. Hgb A1c No results for input(s): "HGBA1C" in the last 72 hours. Lipid Profile No results for input(s): "CHOL", "HDL", "LDLCALC", "TRIG", "CHOLHDL", "LDLDIRECT" in the last 72 hours. Thyroid function  studies No results for input(s): "TSH", "T4TOTAL", "T3FREE", "THYROIDAB" in the last 72 hours.  Invalid input(s): "FREET3" Anemia work up No results for input(s): "VITAMINB12", "FOLATE", "FERRITIN", "TIBC", "IRON", "RETICCTPCT" in the last 72 hours. Urinalysis    Component Value Date/Time   COLORURINE STRAW (A) 12/02/2022 1948   APPEARANCEUR CLEAR 12/02/2022 1948   LABSPEC 1.003 (L) 12/02/2022 1948   PHURINE 7.0 12/02/2022 1948   GLUCOSEU NEGATIVE 12/02/2022 1948   HGBUR SMALL (A) 12/02/2022 1948   BILIRUBINUR NEGATIVE 12/02/2022 1948   KETONESUR NEGATIVE 12/02/2022 1948   PROTEINUR 30 (A) 12/02/2022 1948   NITRITE NEGATIVE 12/02/2022 1948   LEUKOCYTESUR NEGATIVE 12/02/2022 1948   Sepsis Labs Recent Labs  Lab 12/02/22 2020 12/03/22 0441 12/04/22 0418 12/05/22 0427  WBC 9.2 11.2* 10.3 7.6   Microbiology No results found for this or any previous visit (from the past 240 hour(s)).   Time coordinating discharge: 39 minutes  SIGNED:   Alwyn Ren, MD  [facility] 12/06/2022, 4:06 PM

## 2022-12-06 NOTE — Progress Notes (Signed)
AVS instructions were reviewed with patient and husband at bedside. All questions answered. All personal belongings were returned.

## 2022-12-10 ENCOUNTER — Encounter (HOSPITAL_COMMUNITY): Payer: Self-pay | Admitting: Internal Medicine

## 2023-01-26 ENCOUNTER — Other Ambulatory Visit: Payer: Self-pay | Admitting: Family Medicine

## 2023-01-26 DIAGNOSIS — Z1231 Encounter for screening mammogram for malignant neoplasm of breast: Secondary | ICD-10-CM

## 2023-02-24 ENCOUNTER — Ambulatory Visit
Admission: RE | Admit: 2023-02-24 | Discharge: 2023-02-24 | Disposition: A | Discharge status: Home / Self Care | Payer: 59 | Source: Ambulatory Visit | Attending: Family Medicine | Admitting: Family Medicine

## 2023-02-24 DIAGNOSIS — Z1231 Encounter for screening mammogram for malignant neoplasm of breast: Secondary | ICD-10-CM

## 2023-11-03 ENCOUNTER — Other Ambulatory Visit (HOSPITAL_COMMUNITY): Payer: Self-pay

## 2023-11-05 ENCOUNTER — Other Ambulatory Visit (HOSPITAL_COMMUNITY): Payer: Self-pay

## 2023-11-05 MED ORDER — ASPIRIN 81 MG PO CHEW
81.0000 mg | CHEWABLE_TABLET | Freq: Two times a day (BID) | ORAL | 0 refills | Status: AC
  Filled 2023-11-05: qty 60, 30d supply, fill #0

## 2023-11-05 MED ORDER — CELECOXIB 200 MG PO CAPS
200.0000 mg | ORAL_CAPSULE | Freq: Every day | ORAL | 0 refills | Status: AC
  Filled 2023-11-05: qty 7, 7d supply, fill #0

## 2023-11-05 MED ORDER — TRANEXAMIC ACID 650 MG PO TABS
1950.0000 mg | ORAL_TABLET | Freq: Every day | ORAL | 0 refills | Status: AC
  Filled 2023-11-05: qty 9, 3d supply, fill #0

## 2023-11-05 MED ORDER — ONDANSETRON 4 MG PO TBDP
4.0000 mg | ORAL_TABLET | Freq: Four times a day (QID) | ORAL | 0 refills | Status: AC
  Filled 2023-11-05: qty 10, 3d supply, fill #0

## 2023-11-05 MED ORDER — SENNOSIDES 8.6 MG PO TABS
ORAL_TABLET | ORAL | 0 refills | Status: AC
  Filled 2023-11-05: qty 28, 14d supply, fill #0

## 2023-11-05 MED ORDER — HYDROMORPHONE HCL 2 MG PO TABS
2.0000 mg | ORAL_TABLET | ORAL | 0 refills | Status: AC
  Filled 2023-11-05: qty 42, 7d supply, fill #0

## 2023-11-05 MED ORDER — METHOCARBAMOL 500 MG PO TABS
ORAL_TABLET | ORAL | 2 refills | Status: AC
  Filled 2023-11-05: qty 40, 10d supply, fill #0
  Filled 2023-11-16: qty 40, 10d supply, fill #1
  Filled ????-??-??: fill #1

## 2023-11-05 MED ORDER — POLYETHYLENE GLYCOL 3350 17 GM/SCOOP PO POWD
ORAL | 0 refills | Status: AC
  Filled 2023-11-05: qty 238, 14d supply, fill #0

## 2023-11-13 ENCOUNTER — Other Ambulatory Visit (HOSPITAL_COMMUNITY): Payer: Self-pay

## 2023-11-13 MED ORDER — HYDROMORPHONE HCL 2 MG PO TABS
2.0000 mg | ORAL_TABLET | ORAL | 0 refills | Status: AC | PRN
  Filled 2023-11-13: qty 42, 7d supply, fill #0

## 2023-11-16 ENCOUNTER — Other Ambulatory Visit (HOSPITAL_COMMUNITY): Payer: Self-pay

## 2023-11-17 ENCOUNTER — Other Ambulatory Visit (HOSPITAL_COMMUNITY): Payer: Self-pay

## 2023-11-17 MED ORDER — TIZANIDINE HCL 2 MG PO TABS
2.0000 mg | ORAL_TABLET | Freq: Three times a day (TID) | ORAL | 0 refills | Status: AC
  Filled 2023-11-17: qty 40, 7d supply, fill #0

## 2023-11-19 ENCOUNTER — Other Ambulatory Visit (HOSPITAL_COMMUNITY): Payer: Self-pay

## 2023-11-19 MED ORDER — HYDROMORPHONE HCL 2 MG PO TABS
ORAL_TABLET | ORAL | 0 refills | Status: DC
  Filled 2023-11-19: qty 42, 7d supply, fill #0

## 2023-11-26 ENCOUNTER — Other Ambulatory Visit (HOSPITAL_COMMUNITY): Payer: Self-pay

## 2023-11-26 MED ORDER — HYDROMORPHONE HCL 2 MG PO TABS
2.0000 mg | ORAL_TABLET | ORAL | 0 refills | Status: DC
  Filled 2023-11-26: qty 42, 7d supply, fill #0

## 2023-12-03 ENCOUNTER — Other Ambulatory Visit (HOSPITAL_COMMUNITY): Payer: Self-pay

## 2023-12-03 MED ORDER — TIZANIDINE HCL 2 MG PO TABS
2.0000 mg | ORAL_TABLET | Freq: Three times a day (TID) | ORAL | 0 refills | Status: AC | PRN
  Filled 2023-12-03 (×2): qty 40, 7d supply, fill #0

## 2023-12-03 MED ORDER — PREGABALIN 75 MG PO CAPS
75.0000 mg | ORAL_CAPSULE | Freq: Two times a day (BID) | ORAL | 0 refills | Status: AC
  Filled 2023-12-03: qty 60, 30d supply, fill #0

## 2023-12-03 MED ORDER — HYDROMORPHONE HCL 2 MG PO TABS
2.0000 mg | ORAL_TABLET | ORAL | 0 refills | Status: AC
  Filled 2023-12-03 (×2): qty 42, 7d supply, fill #0

## 2023-12-11 ENCOUNTER — Other Ambulatory Visit (HOSPITAL_COMMUNITY): Payer: Self-pay

## 2023-12-11 ENCOUNTER — Other Ambulatory Visit (HOSPITAL_BASED_OUTPATIENT_CLINIC_OR_DEPARTMENT_OTHER): Payer: Self-pay

## 2023-12-11 MED ORDER — PREDNISONE 5 MG (21) PO TBPK
ORAL_TABLET | ORAL | 0 refills | Status: AC
  Filled 2023-12-11: qty 21, 6d supply, fill #0

## 2023-12-25 ENCOUNTER — Other Ambulatory Visit (HOSPITAL_COMMUNITY): Payer: Self-pay

## 2023-12-25 MED ORDER — CYCLOBENZAPRINE HCL 10 MG PO TABS
10.0000 mg | ORAL_TABLET | Freq: Three times a day (TID) | ORAL | 0 refills | Status: AC
  Filled 2023-12-25 (×2): qty 60, 20d supply, fill #0

## 2024-02-08 ENCOUNTER — Other Ambulatory Visit (HOSPITAL_BASED_OUTPATIENT_CLINIC_OR_DEPARTMENT_OTHER): Payer: Self-pay | Admitting: Nurse Practitioner

## 2024-02-08 ENCOUNTER — Emergency Department (HOSPITAL_COMMUNITY)
Admission: EM | Admit: 2024-02-08 | Discharge: 2024-02-08 | Disposition: A | Discharge status: Home / Self Care | Attending: Emergency Medicine | Admitting: Emergency Medicine

## 2024-02-08 ENCOUNTER — Emergency Department (EMERGENCY_DEPARTMENT_HOSPITAL)

## 2024-02-08 ENCOUNTER — Emergency Department (HOSPITAL_COMMUNITY)

## 2024-02-08 DIAGNOSIS — R7989 Other specified abnormal findings of blood chemistry: Secondary | ICD-10-CM

## 2024-02-08 DIAGNOSIS — M79604 Pain in right leg: Secondary | ICD-10-CM

## 2024-02-08 DIAGNOSIS — R0602 Shortness of breath: Secondary | ICD-10-CM | POA: Diagnosis present

## 2024-02-08 DIAGNOSIS — R5383 Other fatigue: Secondary | ICD-10-CM | POA: Diagnosis not present

## 2024-02-08 DIAGNOSIS — Z7982 Long term (current) use of aspirin: Secondary | ICD-10-CM | POA: Diagnosis not present

## 2024-02-08 DIAGNOSIS — M79661 Pain in right lower leg: Secondary | ICD-10-CM | POA: Diagnosis not present

## 2024-02-08 LAB — BASIC METABOLIC PANEL WITH GFR
Anion gap: 11 (ref 5–15)
BUN: 9 mg/dL (ref 6–20)
CO2: 26 mmol/L (ref 22–32)
Calcium: 9.6 mg/dL (ref 8.9–10.3)
Chloride: 105 mmol/L (ref 98–111)
Creatinine, Ser: 0.98 mg/dL (ref 0.44–1.00)
GFR, Estimated: >60 mL/min
Glucose, Bld: 73 mg/dL (ref 70–99)
Potassium: 4 mmol/L (ref 3.5–5.1)
Sodium: 142 mmol/L (ref 135–145)

## 2024-02-08 LAB — CBC WITH DIFFERENTIAL/PLATELET
Abs Immature Granulocytes: 0.01 K/uL (ref 0.00–0.07)
Basophils Absolute: 0.1 K/uL (ref 0.0–0.1)
Basophils Relative: 1 %
Eosinophils Absolute: 0.2 K/uL (ref 0.0–0.5)
Eosinophils Relative: 2 %
HCT: 40.4 % (ref 36.0–46.0)
Hemoglobin: 12.3 g/dL (ref 12.0–15.0)
Immature Granulocytes: 0 %
Lymphocytes Relative: 24 %
Lymphs Abs: 2.1 K/uL (ref 0.7–4.0)
MCH: 27 pg (ref 26.0–34.0)
MCHC: 30.4 g/dL (ref 30.0–36.0)
MCV: 88.8 fL (ref 80.0–100.0)
Monocytes Absolute: 0.9 K/uL (ref 0.1–1.0)
Monocytes Relative: 10 %
Neutro Abs: 5.5 K/uL (ref 1.7–7.7)
Neutrophils Relative %: 63 %
Platelets: 215 K/uL (ref 150–400)
RBC: 4.55 MIL/uL (ref 3.87–5.11)
RDW: 14.5 % (ref 11.5–15.5)
Smear Review: NORMAL
WBC: 8.8 K/uL (ref 4.0–10.5)
nRBC: 0 % (ref 0.0–0.2)

## 2024-02-08 LAB — MAGNESIUM: Magnesium: 1.9 mg/dL (ref 1.7–2.4)

## 2024-02-08 LAB — PRO BRAIN NATRIURETIC PEPTIDE: Pro Brain Natriuretic Peptide: <50 pg/mL

## 2024-02-08 MED ORDER — AZITHROMYCIN 250 MG PO TABS: 250.0000 mg | ORAL_TABLET | Freq: Every day | ORAL | 0 refills | Status: AC

## 2024-02-08 MED ORDER — IOHEXOL 350 MG/ML SOLN
75.0000 mL | Freq: Once | INTRAVENOUS | Status: AC | PRN
  Administered 2024-02-08: 75 mL via INTRAVENOUS

## 2024-02-08 MED ORDER — AZITHROMYCIN 250 MG PO TABS
500.0000 mg | ORAL_TABLET | Freq: Once | ORAL | Status: AC
  Administered 2024-02-08: 500 mg via ORAL
  Filled 2024-02-08: qty 2

## 2024-02-08 MED ORDER — DEXAMETHASONE SOD PHOSPHATE PF 10 MG/ML IJ SOLN
10.0000 mg | Freq: Once | INTRAMUSCULAR | Status: AC
  Administered 2024-02-08: 10 mg via INTRAVENOUS

## 2024-02-08 NOTE — Progress Notes (Signed)
 VASCULAR LAB    Bilateral lower extremity venous duplex has been performed.  See CV proc for preliminary results.  Gave verbal report to Dr. Melvenia LIS, Shin Lamour, RVT 02/08/2024, 6:33 PM

## 2024-02-08 NOTE — Discharge Instructions (Signed)
 Your test results today were reassuring.  Your CT scan did show some areas of inflammation which can suggest an atypical infection.  You were given your first dose of antibiotics in the emergency department.  A prescription for ongoing antibiotics was sent to your pharmacy.  Take as prescribed starting tomorrow.  Return to the emergency department for any new or worsening symptoms of concern.

## 2024-02-08 NOTE — ED Provider Notes (Signed)
 " [place] [facility] AT [facility] Provider Note   CSN: [id number] Arrival date & time: 02/08/24  1538     Patient presents with: Chest Pain and Shortness of Breath   Cynthia [name] is a 59 y.o. female.    Chest Pain Associated symptoms: fatigue and shortness of breath   Shortness of Breath Patient presents for fatigue and shortness of breath.  Medical history includes HLD, depression, anxiety, migraines.  She underwent right knee replacement in October.  6 weeks ago, she had a flulike illness.  While doing PT a week ago, she noticed that she had increased shortness of breath than usual.  She has been seen by her PCP regarding this.  She did undergo outpatient lab work which was notable for a D-dimer of 0.78.  Plan was for outpatient CTA as well as bilateral DVT studies, as she has been having some groin pain since her surgery.  She has not yet been able to undergo the studies.  She attempted to undergo CTA today but IV access was unable to be established.  This reason, she presents to the ED.      Prior to Admission medications  Medication Sig Start Date End Date Taking? Authorizing Provider  azithromycin  (ZITHROMAX ) 250 MG tablet Take 1 tablet (250 mg total) by mouth daily for 4 days. Take 1 every day until finished, starting tomorrow. 02/08/24 02/12/24 Yes Melvenia Motto, MD  acetaminophen  (TYLENOL ) 500 MG tablet Take 1,000 mg by mouth daily as needed for moderate pain or headache.    [provider]  ALPRAZolam  (XANAX ) 1 MG tablet Take 0.5 mg by mouth 3 (three) times daily as needed for anxiety or sleep.    [provider]  aspirin  81 MG chewable tablet Chew 81 mg by mouth daily.    [provider]  aspirin  81 MG chewable tablet Chew 1 tablet twice a day for 30 days. 11/05/23     celecoxib  (CELEBREX ) 200 MG capsule Take 1 capsule by mouth every day with meals for 7 days. 11/05/23     Cholecalciferol (VITAMIN D) 50 MCG (2000 UT) tablet Take 2,000  Units by mouth daily.    [provider]  cyclobenzaprine  (FLEXERIL ) 10 MG tablet Take 1 tablet (10 mg total) by mouth 3 (three) times daily  as needed for spasm. 12/25/23     DAYVIGO 10 MG TABS Take 1 tablet by mouth at bedtime.    [provider]  Doxepin  HCl 6 MG TABS Take 6 mg by mouth at bedtime as needed for sleep. 09/11/20   [provider]  estrogens -methylTEST (ESTRATEST ) 1.25-2.5 MG tablet Take 1 tablet by mouth at bedtime.  03/30/17   [provider]  HYDROmorphone  (DILAUDID ) 2 MG tablet Take 1 tablet by mouth every 4 hours as needed for severe post op pain 11/05/23     HYDROmorphone  (DILAUDID ) 2 MG tablet Take 1 tablet (2 mg total) by mouth every 4 (four) hours as needed for severe post op pain 11/13/23     HYDROmorphone  (DILAUDID ) 2 MG tablet Take 1 tablet by mouth every 4 hours as needed for severe post op pain 12/03/23     lamoTRIgine  (LAMICTAL ) 200 MG tablet Take 400 mg by mouth at bedtime.    [provider]  Levothyroxine  Sodium 88 MCG CAPS Take 88 mcg by mouth at bedtime.    [provider]  lisinopril (ZESTRIL) 2.5 MG tablet Take 2.5 mg by mouth daily.    [provider]  methocarbamol  (ROBAXIN ) 500 MG tablet Take 1 tablet by mouth every 6 hours as needed for muscle spasm 11/05/23     metoprolol  succinate (TOPROL -XL) 50 MG 24 hr tablet Take 50 mg by mouth daily.    [provider]  NURTEC 75 MG TBDP Take 1 tablet by mouth daily as needed. 07/23/22   [provider]  ondansetron  (ZOFRAN -ODT) 4 MG disintegrating tablet Take 1 tablet by mouth every 6 hours as needed for nausea/vomiting 11/05/23     OZEMPIC, 2 MG/DOSE, 8 MG/3ML SOPN Inject 2 mg as directed once a week. 02/08/21   [provider]  polyethylene glycol (MIRALAX  / GLYCOLAX ) 17 g packet Take 17 g by mouth daily. 12/06/22   Will Almarie MATSU, MD  polyethylene glycol powder (MIRALAX ) 17 GM/SCOOP powder Dissolve 1 capful (17g) in 4-8 ounces of  liquid and take by mouth daily. 11/05/23     predniSONE  (STERAPRED UNI-PAK 21 TAB) 5 MG (21) TBPK tablet as directed. 12/11/23     pregabalin  (LYRICA ) 75 MG capsule Take 1 capsule (75 mg total) by mouth 2 (two) times daily. 12/03/23     promethazine  (PHENERGAN ) 12.5 MG tablet Take 1 tablet (12.5 mg total) by mouth every 6 (six) hours as needed for nausea or vomiting. 12/06/22   Will Almarie MATSU, MD  RHOFADE 1 % CREA Apply 1 Application topically every morning.    [provider]  rosuvastatin  (CRESTOR ) 40 MG tablet Take 40 mg by mouth daily. 04/12/17   [provider]  senna (SENOKOT) 8.6 MG tablet Take 2 tablets by mouth every day at bedtime for 14 days. 11/05/23     senna-docusate (SENOKOT-S) 8.6-50 MG tablet Take 3 tablets by mouth at bedtime. 12/06/22   Will Almarie MATSU, MD  SPRIX  15.75 MG/SPRAY SOLN Place 1 spray into the nose every 6 (six) hours as needed (migrane pain). 11/12/21   [provider]  tamsulosin  (FLOMAX ) 0.4 MG CAPS capsule Take 0.4 mg by mouth at bedtime. 02/15/21   [provider]  tiZANidine  (ZANAFLEX ) 2 MG tablet Take 1-2 tablets by mouth every 8 hours as needed for muscle spasm or pain 11/17/23     tiZANidine  (ZANAFLEX ) 2 MG tablet Take 1-2 tablets by mouth every 8 hours as needed for muscle spasm or pain 12/03/23     tranexamic acid  (LYSTEDA ) 650 MG TABS tablet Take 3 tablets by mouth every day for 3 days. 11/05/23     VRAYLAR 3 MG capsule Take 3 mg by mouth daily.    [provider]    Allergies: Prednisone , Amoxicillin, Ampicillin, Apple fruit extract, Apple juice, Cherry extract, Fruit & vegetable daily [nutritional supplements], Imitrex  [sumatriptan ], Nutritional supplements, Other, Peach [prunus persica], Strawberry extract, and Zoloft [sertraline hcl]    Review of Systems  Constitutional:  Positive for fatigue.  Respiratory:  Positive for shortness of breath.   Musculoskeletal:  Positive for myalgias.  All other  systems reviewed and are negative.   Updated Vital Signs BP (!) 147/80 (BP Location: Left Arm)   Pulse (!) 57   Temp 98.3 F (36.8 C) (Oral)   Resp 18   SpO2 100%   Physical Exam Vitals and nursing note reviewed.  Constitutional:      General: She is not in acute distress.    Appearance: She is well-developed. She is not ill-appearing, toxic-appearing or diaphoretic.  HENT:     Head: Normocephalic and atraumatic.  Eyes:     Conjunctiva/sclera: Conjunctivae normal.  Cardiovascular:  Rate and Rhythm: Normal rate and regular rhythm.  Pulmonary:     Effort: Pulmonary effort is normal. No respiratory distress.  Musculoskeletal:        General: No swelling. Normal range of motion.     Cervical back: Normal range of motion and neck supple.  Skin:    General: Skin is warm and dry.     Coloration: Skin is not cyanotic or pale.  Neurological:     General: No focal deficit present.     Mental Status: She is alert and oriented to person, place, and time.  Psychiatric:        Mood and Affect: Mood normal.        Behavior: Behavior normal.     (all labs ordered are listed, but only abnormal results are displayed) Labs Reviewed  CBC WITH DIFFERENTIAL/PLATELET  BASIC METABOLIC PANEL WITH GFR  PRO BRAIN NATRIURETIC PEPTIDE  MAGNESIUM     EKG: EKG Interpretation Date/Time:  Monday February 08 2024 15:44:07 EST Ventricular Rate:  104 PR Interval:  181 QRS Duration:  93 QT Interval:  327 QTC Calculation: 431 R Axis:   21  Text Interpretation: Sinus tachycardia Baseline wander in lead(s) V1 Confirmed by Melvenia Motto ([id number]) on 02/08/2024 5:00:17 PM  Radiology: CT Angio Chest PE W and/or Wo Contrast Result Date: 02/08/2024 EXAM: CTA CHEST 02/08/2024 06:55:02 PM TECHNIQUE: CTA of the chest was performed without and with the administration of 75 mL of iohexol  (OMNIPAQUE ) 350 MG/ML injection. Multiplanar reformatted images are provided for review. MIP images are provided for review.  Automated exposure control, iterative reconstruction, and/or weight based adjustment of the mA/kV was utilized to reduce the radiation dose to as low as reasonably achievable. COMPARISON: None available. CLINICAL HISTORY: Pulmonary embolism (PE) suspected, high prob. FINDINGS: PULMONARY ARTERIES: Pulmonary arteries are adequately opacified for evaluation. No acute pulmonary embolus. Main pulmonary artery is normal in caliber. MEDIASTINUM: The heart and pericardium demonstrate no acute abnormality. There is no acute abnormality of the thoracic aorta. LYMPH NODES: No mediastinal, hilar or axillary lymphadenopathy. LUNGS AND PLEURA: There are mild patchy ground glass opacities throughout both lungs in an upper lobe predominance. There is no lung consolidation. No evidence of pleural effusion or pneumothorax. UPPER ABDOMEN: Gallbladder surgically absent. SOFT TISSUES AND BONES: No acute bone or soft tissue abnormality. IMPRESSION: 1. No pulmonary embolism. 2. Mild patchy ground glass opacities throughout both lungs in an upper lobe predominance, without consolidation. Findings are nonspecific and may be related to mild infection/inflammation. Electronically signed by: Greig Pique MD 02/08/2024 07:36 PM EST RP Workstation: [id number]   VAS US  LOWER EXTREMITY VENOUS (DVT) (ONLY [facility] & [facility]) Result Date: 02/08/2024  Lower Venous DVT Study Patient Name:  KAMEELA LEIPOLD  Date of Exam:   02/08/2024 Medical Rec #: [id number]     Accession #:    [id number] Date of Birth: [birth year]     Patient Gender: F Patient Age:   76 years Exam Location:  [facility] Procedure:      VAS US  LOWER EXTREMITY VENOUS (DVT) Referring Phys: MOTTO Grazia Taffe --------------------------------------------------------------------------------  Indications: Pain, and SOB. Other Indications: Increased SOB after flu like symptoms, recent right knee                    replacement in October, seen by PCP and had elevated d-dimer.                    Chest  tightness and right groin tenderness.  Comparison Study: Prior negative right LEV done 11/07/20 Performing Technologist: Alberta Lis RVS  Examination Guidelines: A complete evaluation includes B-mode imaging, spectral Doppler, color Doppler, and power Doppler as needed of all accessible portions of each vessel. Bilateral testing is considered an integral part of a complete examination. Limited examinations for reoccurring indications may be performed as noted. The reflux portion of the exam is performed with the patient in reverse Trendelenburg.  +---------+---------------+---------+-----------+----------+--------------+ RIGHT    CompressibilityPhasicitySpontaneityPropertiesThrombus Aging +---------+---------------+---------+-----------+----------+--------------+ CFV      Full           Yes      Yes                                 +---------+---------------+---------+-----------+----------+--------------+ SFJ      Full                                                        +---------+---------------+---------+-----------+----------+--------------+ FV Prox  Full                                                        +---------+---------------+---------+-----------+----------+--------------+ FV Mid   Full                                                        +---------+---------------+---------+-----------+----------+--------------+ FV DistalFull                                                        +---------+---------------+---------+-----------+----------+--------------+ PFV      Full                                                        +---------+---------------+---------+-----------+----------+--------------+ POP      Full           Yes      Yes                                 +---------+---------------+---------+-----------+----------+--------------+ PTV      Full                                                         +---------+---------------+---------+-----------+----------+--------------+ PERO     Full                                                        +---------+---------------+---------+-----------+----------+--------------+  GSV      Full                                                        +---------+---------------+---------+-----------+----------+--------------+   +---------+---------------+---------+-----------+----------+--------------+ LEFT     CompressibilityPhasicitySpontaneityPropertiesThrombus Aging +---------+---------------+---------+-----------+----------+--------------+ CFV      Full           Yes      Yes                                 +---------+---------------+---------+-----------+----------+--------------+ SFJ      Full                                                        +---------+---------------+---------+-----------+----------+--------------+ FV Prox  Full                                                        +---------+---------------+---------+-----------+----------+--------------+ FV Mid   Full                                                        +---------+---------------+---------+-----------+----------+--------------+ FV DistalFull                                                        +---------+---------------+---------+-----------+----------+--------------+ PFV      Full                                                        +---------+---------------+---------+-----------+----------+--------------+ POP      Full           Yes      Yes                                 +---------+---------------+---------+-----------+----------+--------------+ PTV      Full                                                        +---------+---------------+---------+-----------+----------+--------------+ PERO     Full                                                         +---------+---------------+---------+-----------+----------+--------------+  Summary: RIGHT: - There is no evidence of deep vein thrombosis in the lower extremity.  - No cystic structure found in the popliteal fossa.  LEFT: - There is no evidence of deep vein thrombosis in the lower extremity.  - No cystic structure found in the popliteal fossa.  *See table(s) above for measurements and observations.    Preliminary      Procedures   Medications Ordered in the ED  azithromycin  (ZITHROMAX ) tablet 500 mg (has no administration in time range)  dexamethasone  (DECADRON ) injection 10 mg (has no administration in time range)  iohexol  (OMNIPAQUE ) 350 MG/ML injection 75 mL (75 mLs Intravenous Contrast Given 02/08/24 1847)                                    Medical Decision Making Amount and/or Complexity of Data Reviewed Labs: ordered. Radiology: ordered.  Risk Prescription drug management.   Patient presents for recent fatigue and shortness of breath.  This does follow knee replacement surgery 3 months ago as well as a flulike illness 6 weeks ago.  On arrival in the ED, patient is well-appearing.  Her breathing is unlabored.  She noticed her shortness of breath while undergoing physical therapy following her knee surgery.  This was approximately a week and a half ago.  Her plan was for outpatient CTA and bilateral DVT studies.  She has been unable to undergo these.  IV access was obtained under ultrasound guidance.  CTA and DVT studies ordered. Of note, her creatinine outpatient lab work was normal.  Her GFR is 84.  Her hemoglobin is 12.4, slightly down from her baseline.  I suspect this is secondary to blood loss during her prior surgery.  Patient's lab work today showed similar findings.  She underwent bilateral DVT studies which were negative.  CTA chest did not show any evidence of PE.  She does have some areas of patchy progress opacities.  Given these findings and the duration of her symptoms,  we will treat for an atypical infection.  Patient is stable for discharge.     Final diagnoses:  Shortness of breath    ED Discharge Orders          Ordered    azithromycin  (ZITHROMAX ) 250 MG tablet  Daily        02/08/24 1952               Melvenia Motto, MD 02/08/24 1953  "

## 2024-02-08 NOTE — ED Triage Notes (Addendum)
 Patient reports increased SOB after flu like symptoms, recent right knee replacement in October, seen by PCP and had elevated d dimer, sent for CTA 2 hours ago but could not get an IV, c/o chest tightness and right groin tenderness. Patient is alert and oriented x 4. Airway patent, respirations even and unlabored. Skin normal, warm and dry.

## 2024-02-10 ENCOUNTER — Other Ambulatory Visit (HOSPITAL_COMMUNITY): Payer: Self-pay

## 2024-02-10 MED ORDER — PREDNISONE 10 MG (21) PO TBPK
ORAL_TABLET | ORAL | 0 refills | Status: AC
  Filled 2024-02-10: qty 21, 6d supply, fill #0

## 2024-02-11 ENCOUNTER — Other Ambulatory Visit: Payer: Self-pay | Admitting: Family Medicine

## 2024-02-11 DIAGNOSIS — Z1231 Encounter for screening mammogram for malignant neoplasm of breast: Secondary | ICD-10-CM

## 2024-02-26 ENCOUNTER — Ambulatory Visit
Admission: RE | Admit: 2024-02-26 | Discharge: 2024-02-26 | Disposition: A | Source: Ambulatory Visit | Attending: Family Medicine | Admitting: Family Medicine

## 2024-02-26 DIAGNOSIS — Z1231 Encounter for screening mammogram for malignant neoplasm of breast: Secondary | ICD-10-CM
